# Patient Record
Sex: Female | Born: 1950
Health system: Southern US, Community
[De-identification: ages and names within clinical notes are randomized; demographics above are authoritative.]

## PROBLEM LIST (undated history)

## (undated) DIAGNOSIS — I10 Essential (primary) hypertension: Secondary | ICD-10-CM

## (undated) DIAGNOSIS — R0602 Shortness of breath: Secondary | ICD-10-CM

## (undated) DIAGNOSIS — Z87442 Personal history of urinary calculi: Secondary | ICD-10-CM

## (undated) DIAGNOSIS — L732 Hidradenitis suppurativa: Secondary | ICD-10-CM

## (undated) DIAGNOSIS — R05 Cough: Secondary | ICD-10-CM

## (undated) DIAGNOSIS — L408 Other psoriasis: Secondary | ICD-10-CM

## (undated) DIAGNOSIS — L219 Seborrheic dermatitis, unspecified: Secondary | ICD-10-CM

## (undated) DIAGNOSIS — R799 Abnormal finding of blood chemistry, unspecified: Secondary | ICD-10-CM

## (undated) DIAGNOSIS — Q638 Other specified congenital malformations of kidney: Secondary | ICD-10-CM

## (undated) DIAGNOSIS — L658 Other specified nonscarring hair loss: Secondary | ICD-10-CM

## (undated) DIAGNOSIS — N318 Other neuromuscular dysfunction of bladder: Secondary | ICD-10-CM

## (undated) DIAGNOSIS — D649 Anemia, unspecified: Secondary | ICD-10-CM

## (undated) DIAGNOSIS — H6060 Unspecified chronic otitis externa, unspecified ear: Secondary | ICD-10-CM

## (undated) DIAGNOSIS — M5137 Other intervertebral disc degeneration, lumbosacral region: Secondary | ICD-10-CM

## (undated) DIAGNOSIS — E663 Overweight: Secondary | ICD-10-CM

## (undated) DIAGNOSIS — D568 Other thalassemias: Secondary | ICD-10-CM

## (undated) DIAGNOSIS — L2089 Other atopic dermatitis: Secondary | ICD-10-CM

## (undated) DIAGNOSIS — R011 Cardiac murmur, unspecified: Secondary | ICD-10-CM

## (undated) DIAGNOSIS — E785 Hyperlipidemia, unspecified: Secondary | ICD-10-CM

## (undated) DIAGNOSIS — R002 Palpitations: Secondary | ICD-10-CM

## (undated) DIAGNOSIS — Z86010 Personal history of colonic polyps: Secondary | ICD-10-CM

## (undated) DIAGNOSIS — E739 Lactose intolerance, unspecified: Secondary | ICD-10-CM

## (undated) DIAGNOSIS — K219 Gastro-esophageal reflux disease without esophagitis: Secondary | ICD-10-CM

## (undated) DIAGNOSIS — L568 Other specified acute skin changes due to ultraviolet radiation: Secondary | ICD-10-CM

## (undated) DIAGNOSIS — J309 Allergic rhinitis, unspecified: Secondary | ICD-10-CM

## (undated) HISTORY — DX: Other atopic dermatitis: L20.89

## (undated) HISTORY — DX: Other intervertebral disc degeneration, lumbosacral region: M51.37

## (undated) HISTORY — DX: Lactose intolerance, unspecified: E73.9

## (undated) HISTORY — DX: Overweight: E66.3

## (undated) HISTORY — DX: Abnormal finding of blood chemistry, unspecified: R79.9

## (undated) HISTORY — DX: Palpitations: R00.2

## (undated) HISTORY — DX: Personal history of urinary calculi: Z87.442

## (undated) HISTORY — DX: Personal history of colonic polyps: Z86.010

## (undated) HISTORY — DX: Hyperlipidemia, unspecified: E78.5

## (undated) HISTORY — DX: Cardiac murmur, unspecified: R01.1

## (undated) HISTORY — DX: Anemia, unspecified: D64.9

## (undated) HISTORY — DX: Cough: R05

## (undated) HISTORY — PX: APPENDECTOMY: SHX54

## (undated) HISTORY — DX: Other neuromuscular dysfunction of bladder: N31.8

## (undated) HISTORY — DX: Gastro-esophageal reflux disease without esophagitis: K21.9

## (undated) HISTORY — DX: Other specified congenital malformations of kidney: Q63.8

## (undated) HISTORY — DX: Shortness of breath: R06.02

## (undated) HISTORY — DX: Hidradenitis suppurativa: L73.2

## (undated) HISTORY — DX: Allergic rhinitis, unspecified: J30.9

## (undated) HISTORY — DX: Essential (primary) hypertension: I10

## (undated) HISTORY — DX: Unspecified chronic otitis externa, unspecified ear: H60.60

## (undated) HISTORY — PX: OTHER SURGICAL HISTORY: SHX169

## (undated) HISTORY — DX: Other thalassemias: D56.8

## (undated) HISTORY — DX: Seborrheic dermatitis, unspecified: L21.9

## (undated) HISTORY — DX: Other psoriasis: L40.8

## (undated) HISTORY — DX: Other specified acute skin changes due to ultraviolet radiation: L56.8

## (undated) HISTORY — DX: Other specified nonscarring hair loss: L65.8

---
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
## 1998-08-25 ENCOUNTER — Ambulatory Visit (HOSPITAL_COMMUNITY): Admission: RE | Admit: 1998-08-25 | Discharge: 1998-08-25 | Payer: Self-pay | Admitting: Gastroenterology

## 1999-02-11 ENCOUNTER — Encounter: Payer: Self-pay | Admitting: Internal Medicine

## 1999-02-11 ENCOUNTER — Ambulatory Visit (HOSPITAL_COMMUNITY): Admission: RE | Admit: 1999-02-11 | Discharge: 1999-02-11 | Payer: Self-pay | Admitting: Internal Medicine

## 2000-06-27 ENCOUNTER — Other Ambulatory Visit: Admission: RE | Admit: 2000-06-27 | Discharge: 2000-06-27 | Payer: Self-pay | Admitting: *Deleted

## 2001-07-11 ENCOUNTER — Other Ambulatory Visit: Admission: RE | Admit: 2001-07-11 | Discharge: 2001-07-11 | Payer: Self-pay | Admitting: Obstetrics and Gynecology

## 2002-04-22 ENCOUNTER — Encounter: Payer: Self-pay | Admitting: *Deleted

## 2002-04-22 ENCOUNTER — Ambulatory Visit (HOSPITAL_COMMUNITY): Admission: RE | Admit: 2002-04-22 | Discharge: 2002-04-22 | Payer: Self-pay | Admitting: *Deleted

## 2002-05-09 ENCOUNTER — Encounter: Payer: Self-pay | Admitting: Urology

## 2002-05-09 ENCOUNTER — Ambulatory Visit (HOSPITAL_COMMUNITY): Admission: RE | Admit: 2002-05-09 | Discharge: 2002-05-09 | Payer: Self-pay | Admitting: Urology

## 2002-05-16 ENCOUNTER — Encounter: Payer: Self-pay | Admitting: Internal Medicine

## 2002-05-16 ENCOUNTER — Encounter: Admission: RE | Admit: 2002-05-16 | Discharge: 2002-05-16 | Payer: Self-pay | Admitting: Internal Medicine

## 2002-07-16 ENCOUNTER — Other Ambulatory Visit: Admission: RE | Admit: 2002-07-16 | Discharge: 2002-07-16 | Payer: Self-pay | Admitting: *Deleted

## 2003-07-31 ENCOUNTER — Other Ambulatory Visit: Admission: RE | Admit: 2003-07-31 | Discharge: 2003-07-31 | Payer: Self-pay | Admitting: *Deleted

## 2004-01-07 ENCOUNTER — Encounter: Payer: Self-pay | Admitting: Family Medicine

## 2004-01-07 LAB — CONVERTED CEMR LAB
Casts: NEGATIVE /lpf
Crystals: NEGATIVE
Leukocytes, UA: NEGATIVE
RBC / HPF: NONE SEEN
Urine Glucose: NEGATIVE mg/dL
WBC, UA: NONE SEEN cells/hpf

## 2004-08-26 ENCOUNTER — Other Ambulatory Visit: Admission: RE | Admit: 2004-08-26 | Discharge: 2004-08-26 | Payer: Self-pay | Admitting: Obstetrics and Gynecology

## 2005-11-28 DIAGNOSIS — I10 Essential (primary) hypertension: Secondary | ICD-10-CM | POA: Insufficient documentation

## 2005-11-28 HISTORY — DX: Essential (primary) hypertension: I10

## 2007-03-01 ENCOUNTER — Ambulatory Visit (HOSPITAL_COMMUNITY): Admission: RE | Admit: 2007-03-01 | Discharge: 2007-03-01 | Payer: Self-pay | Admitting: Family Medicine

## 2007-03-01 ENCOUNTER — Ambulatory Visit: Payer: Self-pay | Admitting: Family Medicine

## 2007-03-01 DIAGNOSIS — R0602 Shortness of breath: Secondary | ICD-10-CM

## 2007-03-01 HISTORY — DX: Shortness of breath: R06.02

## 2007-03-01 LAB — CONVERTED CEMR LAB
Albumin: 4.3 g/dL (ref 3.5–5.2)
CO2: 29 meq/L (ref 19–32)
Glucose, Bld: 87 mg/dL (ref 70–99)
MCV: 69 fL — ABNORMAL LOW (ref 78.0–100.0)
RBC: 5.54 M/uL — ABNORMAL HIGH (ref 3.87–5.11)
Sodium: 140 meq/L (ref 135–145)
TSH: 0.854 microintl units/mL (ref 0.350–5.50)
Total Bilirubin: 0.5 mg/dL (ref 0.3–1.2)
Total Protein: 7.9 g/dL (ref 6.0–8.3)
Vitamin B-12: 1497 pg/mL — ABNORMAL HIGH (ref 211–911)
WBC: 4.6 10*3/uL (ref 4.0–10.5)

## 2007-03-02 ENCOUNTER — Telehealth: Payer: Self-pay | Admitting: Family Medicine

## 2007-03-02 DIAGNOSIS — E663 Overweight: Secondary | ICD-10-CM | POA: Insufficient documentation

## 2007-03-02 DIAGNOSIS — D568 Other thalassemias: Secondary | ICD-10-CM | POA: Insufficient documentation

## 2007-03-02 DIAGNOSIS — E739 Lactose intolerance, unspecified: Secondary | ICD-10-CM | POA: Insufficient documentation

## 2007-03-02 DIAGNOSIS — L568 Other specified acute skin changes due to ultraviolet radiation: Secondary | ICD-10-CM

## 2007-03-02 DIAGNOSIS — Q638 Other specified congenital malformations of kidney: Secondary | ICD-10-CM

## 2007-03-02 DIAGNOSIS — H608X9 Other otitis externa, unspecified ear: Secondary | ICD-10-CM

## 2007-03-02 DIAGNOSIS — R002 Palpitations: Secondary | ICD-10-CM | POA: Insufficient documentation

## 2007-03-02 DIAGNOSIS — L209 Atopic dermatitis, unspecified: Secondary | ICD-10-CM

## 2007-03-02 DIAGNOSIS — H6060 Unspecified chronic otitis externa, unspecified ear: Secondary | ICD-10-CM

## 2007-03-02 DIAGNOSIS — Z87442 Personal history of urinary calculi: Secondary | ICD-10-CM

## 2007-03-02 DIAGNOSIS — J309 Allergic rhinitis, unspecified: Secondary | ICD-10-CM

## 2007-03-02 DIAGNOSIS — L2089 Other atopic dermatitis: Secondary | ICD-10-CM

## 2007-03-02 DIAGNOSIS — Z860100 Personal history of colon polyps, unspecified: Secondary | ICD-10-CM | POA: Insufficient documentation

## 2007-03-02 DIAGNOSIS — M51379 Other intervertebral disc degeneration, lumbosacral region without mention of lumbar back pain or lower extremity pain: Secondary | ICD-10-CM

## 2007-03-02 DIAGNOSIS — L658 Other specified nonscarring hair loss: Secondary | ICD-10-CM

## 2007-03-02 DIAGNOSIS — M5137 Other intervertebral disc degeneration, lumbosacral region: Secondary | ICD-10-CM

## 2007-03-02 DIAGNOSIS — K219 Gastro-esophageal reflux disease without esophagitis: Secondary | ICD-10-CM

## 2007-03-02 HISTORY — DX: Palpitations: R00.2

## 2007-03-02 HISTORY — DX: Other atopic dermatitis: L20.89

## 2007-03-02 HISTORY — DX: Personal history of colon polyps, unspecified: Z86.0100

## 2007-03-02 HISTORY — DX: Other intervertebral disc degeneration, lumbosacral region without mention of lumbar back pain or lower extremity pain: M51.379

## 2007-03-02 HISTORY — DX: Allergic rhinitis, unspecified: J30.9

## 2007-03-02 HISTORY — DX: Other otitis externa, unspecified ear: H60.8X9

## 2007-03-02 HISTORY — DX: Lactose intolerance, unspecified: E73.9

## 2007-03-02 HISTORY — DX: Other intervertebral disc degeneration, lumbosacral region: M51.37

## 2007-03-02 HISTORY — DX: Other thalassemias: D56.8

## 2007-03-02 HISTORY — DX: Other specified nonscarring hair loss: L65.8

## 2007-03-02 HISTORY — DX: Personal history of urinary calculi: Z87.442

## 2007-03-02 HISTORY — DX: Other specified congenital malformations of kidney: Q63.8

## 2007-03-02 HISTORY — DX: Other specified acute skin changes due to ultraviolet radiation: L56.8

## 2007-03-02 HISTORY — DX: Overweight: E66.3

## 2007-03-02 HISTORY — DX: Gastro-esophageal reflux disease without esophagitis: K21.9

## 2007-03-02 HISTORY — DX: Personal history of colonic polyps: Z86.010

## 2007-03-05 ENCOUNTER — Encounter: Payer: Self-pay | Admitting: Family Medicine

## 2007-03-06 ENCOUNTER — Encounter: Payer: Self-pay | Admitting: Family Medicine

## 2007-03-07 ENCOUNTER — Encounter: Payer: Self-pay | Admitting: Family Medicine

## 2007-03-07 ENCOUNTER — Ambulatory Visit: Payer: Self-pay | Admitting: Family Medicine

## 2007-03-08 ENCOUNTER — Encounter: Payer: Self-pay | Admitting: Family Medicine

## 2007-03-09 ENCOUNTER — Encounter: Payer: Self-pay | Admitting: *Deleted

## 2007-11-06 ENCOUNTER — Ambulatory Visit: Payer: Self-pay | Admitting: Gastroenterology

## 2007-11-12 LAB — HM MAMMOGRAPHY: HM Mammogram: NORMAL

## 2007-11-20 ENCOUNTER — Ambulatory Visit: Payer: Self-pay | Admitting: Gastroenterology

## 2007-12-03 ENCOUNTER — Ambulatory Visit: Payer: Self-pay | Admitting: Family Medicine

## 2007-12-03 LAB — CONVERTED CEMR LAB
BUN: 11 mg/dL (ref 6–23)
CO2: 23 meq/L (ref 19–32)
Calcium: 9 mg/dL (ref 8.4–10.5)
Glucose, Bld: 90 mg/dL (ref 70–99)
Sodium: 142 meq/L (ref 135–145)
Total CHOL/HDL Ratio: 3.8

## 2007-12-04 ENCOUNTER — Encounter: Payer: Self-pay | Admitting: Family Medicine

## 2007-12-04 DIAGNOSIS — L219 Seborrheic dermatitis, unspecified: Secondary | ICD-10-CM | POA: Insufficient documentation

## 2007-12-04 HISTORY — DX: Seborrheic dermatitis, unspecified: L21.9

## 2007-12-11 ENCOUNTER — Telehealth: Payer: Self-pay | Admitting: Gastroenterology

## 2008-05-15 ENCOUNTER — Ambulatory Visit: Payer: Self-pay | Admitting: Family Medicine

## 2008-08-28 DIAGNOSIS — L408 Other psoriasis: Secondary | ICD-10-CM

## 2008-08-28 HISTORY — DX: Other psoriasis: L40.8

## 2008-09-10 ENCOUNTER — Encounter: Payer: Self-pay | Admitting: Family Medicine

## 2008-09-10 LAB — CONVERTED CEMR LAB
Eosinophils Absolute: 0.1 10*3/uL
Hemoglobin: 11.7 g/dL
Lymphs Abs: 1.5 10*3/uL
MCV: 67.1 fL
Monocytes Absolute: 0.2 10*3/uL
Monocytes Relative: 6 %
Neutro Abs: 2.5 10*3/uL
Neutrophils Relative %: 58 %
RBC: 5.47 M/uL
WBC: 4.3 10*3/uL

## 2008-09-11 ENCOUNTER — Encounter: Payer: Self-pay | Admitting: Family Medicine

## 2008-09-11 LAB — CONVERTED CEMR LAB
ALT: 12 units/L
AST: 16 units/L
Albumin: 4 g/dL
Bilirubin, Direct: 0.1 mg/dL
GGT: 7.3 units/L
Total Protein: 7.3 g/dL

## 2008-09-30 ENCOUNTER — Encounter: Payer: Self-pay | Admitting: Family Medicine

## 2008-09-30 LAB — CONVERTED CEMR LAB
ALT: 10 units/L
Albumin: 3.9 g/dL
Basophils Relative: 0 %
Eosinophils Absolute: 0.1 10*3/uL
Hemoglobin: 11.1 g/dL
Iron: 46 ug/dL
MCHC: 31.6 g/dL
MCV: 67.5 fL
Monocytes Absolute: 0.3 10*3/uL
Monocytes Relative: 6 %
RBC: 5.2 M/uL
RDW: 16.2 %
Total Protein: 7.4 g/dL
UIBC: 235 ug/dL

## 2008-10-01 ENCOUNTER — Encounter: Payer: Self-pay | Admitting: Family Medicine

## 2008-10-01 ENCOUNTER — Telehealth: Payer: Self-pay | Admitting: Family Medicine

## 2008-10-09 ENCOUNTER — Encounter: Admission: RE | Admit: 2008-10-09 | Discharge: 2008-10-09 | Payer: Self-pay | Admitting: Family Medicine

## 2008-10-09 ENCOUNTER — Ambulatory Visit: Payer: Self-pay | Admitting: Family Medicine

## 2008-10-09 DIAGNOSIS — R059 Cough, unspecified: Secondary | ICD-10-CM

## 2008-10-09 DIAGNOSIS — D649 Anemia, unspecified: Secondary | ICD-10-CM

## 2008-10-09 DIAGNOSIS — R05 Cough: Secondary | ICD-10-CM

## 2008-10-09 HISTORY — DX: Cough, unspecified: R05.9

## 2008-10-09 HISTORY — DX: Anemia, unspecified: D64.9

## 2008-10-09 LAB — CONVERTED CEMR LAB: Saturation Ratios: 37 % (ref 20–55)

## 2008-10-10 ENCOUNTER — Encounter: Payer: Self-pay | Admitting: Family Medicine

## 2008-10-10 ENCOUNTER — Telehealth: Payer: Self-pay | Admitting: Family Medicine

## 2008-11-25 ENCOUNTER — Encounter: Admission: RE | Admit: 2008-11-25 | Discharge: 2008-11-25 | Payer: Self-pay | Admitting: Obstetrics and Gynecology

## 2009-09-03 ENCOUNTER — Ambulatory Visit: Payer: Self-pay | Admitting: Internal Medicine

## 2009-09-03 DIAGNOSIS — N318 Other neuromuscular dysfunction of bladder: Secondary | ICD-10-CM

## 2009-09-03 DIAGNOSIS — R768 Other specified abnormal immunological findings in serum: Secondary | ICD-10-CM | POA: Insufficient documentation

## 2009-09-03 DIAGNOSIS — R799 Abnormal finding of blood chemistry, unspecified: Secondary | ICD-10-CM

## 2009-09-03 HISTORY — DX: Other neuromuscular dysfunction of bladder: N31.8

## 2009-09-03 HISTORY — DX: Abnormal finding of blood chemistry, unspecified: R79.9

## 2009-09-08 ENCOUNTER — Ambulatory Visit: Payer: Self-pay | Admitting: Internal Medicine

## 2009-09-08 LAB — CONVERTED CEMR LAB
Alkaline Phosphatase: 67 units/L (ref 39–117)
BUN: 6 mg/dL (ref 6–23)
Basophils Absolute: 0 10*3/uL (ref 0.0–0.1)
Basophils Relative: 0.5 % (ref 0.0–3.0)
Bilirubin, Direct: 0.1 mg/dL (ref 0.0–0.3)
Chloride: 102 meq/L (ref 96–112)
Cholesterol: 189 mg/dL (ref 0–200)
Eosinophils Absolute: 0.1 10*3/uL (ref 0.0–0.7)
LDL Cholesterol: 124 mg/dL — ABNORMAL HIGH (ref 0–99)
Lymphocytes Relative: 45.5 % (ref 12.0–46.0)
MCHC: 32 g/dL (ref 30.0–36.0)
Neutrophils Relative %: 42.6 % — ABNORMAL LOW (ref 43.0–77.0)
Potassium: 3.8 meq/L (ref 3.5–5.1)
RBC: 4.95 M/uL (ref 3.87–5.11)
RDW: 16.6 % — ABNORMAL HIGH (ref 11.5–14.6)
Specific Gravity, Urine: 1.01 (ref 1.000–1.030)
Total Bilirubin: 0.8 mg/dL (ref 0.3–1.2)
Urine Glucose: NEGATIVE mg/dL
VLDL: 16.8 mg/dL (ref 0.0–40.0)
pH: 7.5 (ref 5.0–8.0)

## 2009-10-22 ENCOUNTER — Telehealth: Payer: Self-pay | Admitting: Internal Medicine

## 2009-11-23 ENCOUNTER — Telehealth: Payer: Self-pay | Admitting: Internal Medicine

## 2009-12-30 ENCOUNTER — Encounter: Payer: Self-pay | Admitting: Internal Medicine

## 2010-01-05 ENCOUNTER — Encounter: Payer: Self-pay | Admitting: Internal Medicine

## 2010-03-05 ENCOUNTER — Ambulatory Visit: Payer: Self-pay | Admitting: Internal Medicine

## 2010-03-05 DIAGNOSIS — L732 Hidradenitis suppurativa: Secondary | ICD-10-CM | POA: Insufficient documentation

## 2010-03-05 HISTORY — DX: Hidradenitis suppurativa: L73.2

## 2010-06-20 ENCOUNTER — Encounter: Payer: Self-pay | Admitting: Orthopedic Surgery

## 2010-06-27 LAB — CONVERTED CEMR LAB
Anti Nuclear Antibody(ANA): POSITIVE — AB
LDL Cholesterol: 106 mg/dL — ABNORMAL HIGH (ref 0–99)
Total Protein, Serum Electrophoresis: 7.8 g/dL (ref 6.0–8.3)

## 2010-06-29 NOTE — Assessment & Plan Note (Signed)
Summary: 6 MO ROV /NWS #   Vital Signs:  Patient profile:   60 year old female Height:      61 inches Weight:      157.38 pounds BMI:     29.84 O2 Sat:      98 % on Room air Temp:     98.1 degrees F oral Pulse rate:   56 / minute BP sitting:   120 / 60  (left arm) Cuff size:   regular  Vitals Entered By: Alysia Penna (March 05, 2010 2:24 PM)  O2 Flow:  Room air CC: pt here for 6 mth follow up. /cp sma   CC:  pt here for 6 mth follow up. /cp sma.  History of Present Illness: here to f/u with routine, but incidently  with 2-3 days fever, mild ST, prod cough but Pt denies CP, worsening sob, doe, wheezing, orthopnea, pnd, worsening LE edema, palps, dizziness or syncope  Pt denies new neuro symptoms such as headache, facial or extremity weakness  Denies polydipsia or polyuria.  Also with a tender area for 1 wk to right axilla, wihtout drainage or d/c or hx of similar in the past.  No trauma, injury,  No recent wt loss, night sweats, loss of appetite or other constitutional symptoms .  Overall good compliance with meds,  good tolerabity.    Problems Prior to Update: 1)  Hidradenitis  (ICD-705.83) 2)  Bronchitis-acute  (ICD-466.0) 3)  Preventive Health Care  (ICD-V70.0) 4)  Overactive Bladder  (ICD-596.51) 5)  Anemia-nos  (ICD-285.9) 6)  Ana Positive  (ICD-790.99) 7)  Psoriasis  (ICD-696.1) 8)  Cough  (ICD-786.2) 9)  Anemia  (ICD-285.9) 10)  Dermatitis, Seborrheic  (ICD-690.10) 11)  Thalassemia Nec  (ICD-282.49) 12)  Hypertension, Benign  (ICD-401.1) 13)  Palpitations, Recurrent  (ICD-785.1) 14)  Otitis Externa, Chronic Nec  (ICD-380.23) 15)  Photoallergic Dermatitis  (ICD-692.72) 16)  Degeneration, Lumbar/lumbosacral Disc  (ICD-722.52) 17)  Nephrolithiasis, Hx of  (ICD-V13.01) 18)  Gerd  (ICD-530.81) 19)  Colonic Polyps, Hx of  (ICD-V12.72) 20)  Allergic Rhinitis  (ICD-477.9) 21)  Overweight  (ICD-278.02) 22)  Lactose Intolerance  (ICD-271.3) 23)  Sx of Urinary  Incontinence, Urge, Mild  (ICD-788.31) 24)  Sudden Death, Family Hx  (ICD-V19.8) 25)  Neoplasm, Malignant, Breast, Family Hx, Mother  (ICD-V16.3) 51)  Anomaly, Congenital, Kidney Nec  (ICD-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 Per Dermatologist 2)  Proair Hfa 108 [phone] Base) Mcg/act Aers (Albuterol Sulfate) .... 2 Inhalations Four Times A Day For Two Weeks 3)  Methotrexate 2.5 Mg Tabs (Methotrexate Sodium) .... Take 9 Tablets By Mouth Once A Week Every Wednesday 4)  Hydrochlorothiazide 12.5 Mg Caps (Hydrochlorothiazide) .Marland Kitchen.. 1 Tablet Daily By Mouth 5)  Folic Acid 1 Mg Tabs (Folic Acid) .Marland Kitchen.. 1 By Mouth Once Daily 6)  Vesicare 5 Mg Tabs (Solifenacin Succinate) .Marland Kitchen.. 1po Once Daily 7)  Hydrochlorothiazide 12.5 Mg Tabs (Hydrochlorothiazide) .Marland Kitchen.. 1 By Mouth Once Daily  Current Medications (verified): 1)  Clobetasol Propionate 0.05 %  Crea (Clobetasol Propionate) .... Per Dermatologist 2)  Proair Hfa 108 [phone] Base) Mcg/act Aers (Albuterol Sulfate) .... 2 Inhalations Four Times A Day For Two Weeks 3)  Methotrexate 2.5 Mg Tabs (Methotrexate Sodium) .Marland KitchenMarland KitchenMarland Kitchen  Take 9 Tablets By Mouth Once A Week Every Wednesday 4)  Folic Acid 1 Mg Tabs (Folic Acid) .Marland Kitchen.. 1 By Mouth Once Daily 5)  Vesicare 5 Mg Tabs (Solifenacin Succinate) .Marland Kitchen.. 1po Once Daily 6)  Hydrochlorothiazide 12.5 Mg Tabs (Hydrochlorothiazide) .Marland Kitchen.. 1 By Mouth Once Daily 7)  Cephalexin 500 Mg Caps (Cephalexin) .Marland Kitchen.. 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 Renal calculi (one) in pelvis of Kidney by 2003 Abdominal CT. Seen by Dr. Brunilda Payor who felt they were too small to be of consequence. Palpitations (NOS) Hypertension, benign (dx 2007) "Small esophagus" with intermittent dysphagia to solids (EGD years ago by [facility] in 2002 by Dr. Loreta Ave - some polyps found.  usually has repeat colonoscopy every 5 years. Consitpation- usual pattern on "hard" stool BM every 3 to 4 days GERD-Followed by Dr. Loreta Ave with EGD in approximately year 2000 Lactose intolerance- increase flatus. Colonic polyps, hx of Nephrolithiasis, hx of Degenerative Disc Disease L3-4 and L4-5 on Lumbar spine MRI 04/2002. Complained of Back paine to [facility] MD in 12/03, described as discomfort in right lumbar with radiation to foot with some numbness and weakness and pain 5-7/10 scale. Likely thalassemia minor with Hemoglobin Electrophoresis (12/03) with Normal Hgb A, Hgb A2, Hgb F levels and no Hgb S, C, E, or other detected.  ANA positive GERD Allergic rhinitis Anemia-NOS  Past Surgical History: Last updated: 03/07/2007 Exploratory abdominal Surgery in 1970's. Appendectomy S/P BTL S/P C-section twice  Social History: Last updated: 10/09/2008 Two adult sons, born  ~1988 and 58, both living in area, One son is is married to [facility] CNA [employer]. 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  , mild ill  Head:  normocephalic and atraumatic.   Eyes:  vision grossly intact, pupils equal, and pupils round.   Ears:  bilat tm's with mild red, sinus nontender Nose:  nasal dischargemucosal pallor and mucosal edema.   Mouth:  pharyngeal erythema and fair dentition.   Neck:  supple and no masses.   Lungs:  normal respiratory effort and normal breath sounds.   Heart:  normal rate and regular rhythm.   Extremities:  no edema, no erythema  Axillary Nodes:  several tender, swollen right axialla only but < 1 cm   Impression & Recommendations:  Problem # 1:  BRONCHITIS-ACUTE (ICD-466.0)  Her updated medication list for this problem includes:    Proair Hfa 108 (90 Base) Mcg/act Aers (Albuterol sulfate) .Marland Kitchen... 2 inhalations four times a day for two weeks    Cephalexin 500 Mg Caps (Cephalexin) .Marland Kitchen... 1po three times a day treat as above, f/u any worsening signs or symptoms  Problem # 2:  HIDRADENITIS (ICD-705.83) mild, right axilla - treat as above, f/u any worsening signs or symptoms   Problem # 3:  HYPERTENSION, BENIGN (ICD-401.1)  The following medications were removed from the medication list:    Hydrochlorothiazide 12.5 Mg Caps (Hydrochlorothiazide) .Marland Kitchen... 1 tablet daily by mouth Her updated medication list for this problem includes:    Hydrochlorothiazide 12.5 Mg Tabs (Hydrochlorothiazide) .Marland Kitchen... 1 by mouth once daily  BP today: 120/60 Prior BP: 118/68 (09/03/2009)  Prior 10 Yr Risk Heart Disease: Not enough information (03/01/2007)  Labs Reviewed: K+: 3.8 (09/08/2009) Creat: : 0.7 (09/08/2009)   Chol: 189 (09/08/2009)   HDL: 47.80 (09/08/2009)   LDL: 124 (09/08/2009)   TG: 84.0 (09/08/2009) stable overall by hx and exam, ok to continue meds/tx as is   Complete Medication List: 1)  Clobetasol Propionate 0.05 % Crea (Clobetasol propionate) ....  Per dermatologist 2)  Proair Hfa 108 [phone] Base) Mcg/act Aers (Albuterol sulfate) .... 2 inhalations four times a day for two weeks 3)  Methotrexate 2.5 Mg Tabs (Methotrexate sodium) .... Take 9 tablets by mouth once a week every wednesday 4)  Folic Acid 1 Mg Tabs (Folic acid) .Marland Kitchen.. 1 by mouth once daily 5)  Vesicare 5 Mg Tabs (Solifenacin succinate) .Marland Kitchen.. 1po once daily 6)  Hydrochlorothiazide 12.5 Mg Tabs (Hydrochlorothiazide) .Marland Kitchen.. 1 by mouth once daily 7)  Cephalexin 500 Mg Caps (Cephalexin) .Marland Kitchen.. 1po three times a day  Patient Instructions: 1)  Please take all new medications as prescribed 2)  Continue all previous medications as before this visit  3)  Please schedule a follow-up appointment in 6 months with CPX labs Prescriptions: CEPHALEXIN 500 MG CAPS (CEPHALEXIN) 1po three times a day  #30 x 0   Entered and Authorized by:   Corwin Levins MD   Signed by:   Corwin Levins MD on 03/05/2010   Method used:   Print then Give to Patient   RxID:   [id number]

## 2010-06-29 NOTE — Progress Notes (Signed)
  Phone Note From Pharmacy   Caller: [facility] [address] Summary of Call: Pharmacy requesting pt. would like to switch to Hydrochlorot 12.5mg  tablets Initial call taken by: Robin Ewing CMA Duncan Dull),  November 23, 2009 2:58 PM  Follow-up for Phone Call        ok with me - to robin to handle Follow-up by: Corwin Levins MD,  November 23, 2009 3:53 PM    New/Updated Medications: HYDROCHLOROTHIAZIDE 12.5 MG TABS (HYDROCHLOROTHIAZIDE) 1 by mouth once daily Prescriptions: HYDROCHLOROTHIAZIDE 12.5 MG TABS (HYDROCHLOROTHIAZIDE) 1 by mouth once daily  #30 x 9   Entered by:   Zella Ball Ewing CMA (AAMA)   Authorized by:   Corwin Levins MD   Signed by:   Scharlene Gloss CMA (AAMA) on 11/23/2009   Method used:   Electronically to        [facility] [address] (retail)       [phone]       [place], [place]  [address]       Ph: [phone]       Fax: [phone]   RxID:   [id number]

## 2010-06-29 NOTE — Assessment & Plan Note (Signed)
Summary: NEW / BCBS / #/ CD   Vital Signs:  Patient profile:   60 year old female Height:      61 inches Weight:      157.13 pounds BMI:     29.80 O2 Sat:      97 % on Room air Temp:     98.6 degrees F oral Pulse rate:   58 / minute BP sitting:   118 / 68  (left arm) Cuff size:   regular  Vitals Entered ByZella Ball Ewing (September 03, 2009 9:44 AM)  O2 Flow:  Room air  CC: New Pt. New BCBS/RE   CC:  New Pt. New BCBS/RE.  History of Present Illness: overall doing ok;  no specific complaiints;  has chronic rash doesnot hurt or itch;  Pt denies CP, sob, doe, wheezing, orthopnea, pnd, worsening LE edema, palps, dizziness or syncope  Pt denies new neuro symptoms such as headache, facial or extremity weakness  Has some fatigue assoc with some sleep difficulty and gets up 3 or 4 times during the night with urination most times.  Tends to have OAB type symtpoms during the day as well.    Problems Prior to Update: 1)  Overactive Bladder  (ICD-596.51) 2)  Anemia-nos  (ICD-285.9) 3)  Ana Positive  (ICD-790.99) 4)  Psoriasis  (ICD-696.1) 5)  Cough  (ICD-786.2) 6)  Anemia  (ICD-285.9) 7)  Dermatitis, Seborrheic  (ICD-690.10) 8)  Thalassemia Nec  (ICD-282.49) 9)  Hypertension, Benign  (ICD-401.1) 10)  Palpitations, Recurrent  (ICD-785.1) 11)  Otitis Externa, Chronic Nec  (ICD-380.23) 12)  Photoallergic Dermatitis  (ICD-692.72) 13)  Degeneration, Lumbar/lumbosacral Disc  (ICD-722.52) 14)  Nephrolithiasis, Hx of  (ICD-V13.01) 15)  Gerd  (ICD-530.81) 16)  Colonic Polyps, Hx of  (ICD-V12.72) 17)  Allergic Rhinitis  (ICD-477.9) 18)  Overweight  (ICD-278.02) 19)  Lactose Intolerance  (ICD-271.3) 20)  Sx of Urinary Incontinence, Urge, Mild  (ICD-788.31) 21)  Sudden Death, Family Hx  (ICD-V19.8) 22)  Neoplasm, Malignant, Breast, Family Hx, Mother  (ICD-V16.3) 52)  Anomaly, Congenital, Kidney Nec  (ICD-753.3) 24)  Alopecia Totalis  (ICD-704.09) 25)  Hx of Dermatitis, Atopic  (ICD-691.8) 26)   Screening For Lipoid Disorders  (ICD-V77.91) 27)  Dyspnea  (ICD-786.05) 28)  Aftercare, Long-term Use, Medications Nec  (ICD-V58.69)  Medications Prior to Update: 1)  Clobetasol Propionate 0.05 %  Crea (Clobetasol Propionate) .... Per Dermatologist 2)  Proair Hfa 108 [phone] Base) Mcg/act Aers (Albuterol Sulfate) .... 2 Inhalations Four Times A Day For Two Weeks 3)  Methotrexate 2.5 Mg Tabs (Methotrexate Sodium) .... Take 9 Tablets By Mouth Once A Week Every Wednesday 4)  Hydrochlorothiazide 12.5 Mg Caps (Hydrochlorothiazide) .Marland Kitchen.. 1 Tablet Daily By Mouth  Current Medications (verified): 1)  Clobetasol Propionate 0.05 %  Crea (Clobetasol Propionate) .... Per Dermatologist 2)  Proair Hfa 108 [phone] Base) Mcg/act Aers (Albuterol Sulfate) .... 2 Inhalations Four Times A Day For Two Weeks 3)  Methotrexate 2.5 Mg Tabs (Methotrexate Sodium) .... Take 9 Tablets By Mouth Once A Week Every Wednesday 4)  Hydrochlorothiazide 12.5 Mg Caps (Hydrochlorothiazide) .Marland Kitchen.. 1 Tablet Daily By Mouth 5)  Folic Acid 1 Mg Tabs (Folic Acid) .Marland Kitchen.. 1 By Mouth Once Daily 6)  Vesicare 5 Mg Tabs (Solifenacin Succinate) .Marland Kitchen.. 1po Once Daily  Allergies (verified): No Known Drug Allergies  Past History:  Family History: Last updated: March 05, 2007 Sister died age 61 of sudden death Sister had heart valve replaced at age 30  Father with heart atack at  age 34, died age 27 Mother with Breast Cancer diagnosed at age 34, died age 29  Social History: Last updated: 10/09/2008 Two adult sons, born  ~1988 and 97, both living in area, One son is is married to [facility] CNA [employer]. 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 Renal calculi (one) in pelvis of Kidney by 2003 Abdominal CT. Seen by Dr. Brunilda Payor who felt they were too small to be of consequence. Palpitations (NOS) Hypertension, benign (dx 2007) "Small esophagus" with intermittent dysphagia to solids (EGD years ago by [facility] in 2002 by Dr. Loreta Ave - some polyps found.  usually has repeat colonoscopy every 5 years. Consitpation- usual pattern on "hard" stool BM every 3 to 4 days GERD-Followed by Dr. Loreta Ave with EGD in approximately year 2000 Lactose intolerance- increase flatus. Colonic polyps, hx of Nephrolithiasis, hx of Degenerative Disc Disease L3-4 and L4-5 on Lumbar spine MRI 04/2002. Complained of Back paine to [facility] MD in 12/03, described as discomfort in right lumbar with radiation to foot with some numbness and weakness and pain 5-7/10 scale. Likely thalassemia minor with Hemoglobin Electrophoresis (12/03) with Normal Hgb A, Hgb A2, Hgb F levels and no Hgb S, C, E, or other detected.  ANA positive GERD Allergic rhinitis Anemia-NOS  Past Surgical History: Reviewed history from 03/07/2007 and no changes required. Exploratory abdominal Surgery in 1970's. Appendectomy S/P BTL S/P C-section twice  Family History: Reviewed history from 03/01/2007 and no changes required. Sister died age 66 of sudden death Sister had heart valve replaced at age 53  Father with heart atack at age 76, died age 43 Mother with Breast Cancer diagnosed at age 25, died age 80  Social History: Reviewed history from 10/09/2008 and no changes  required. Two adult sons, born  ~91 and 97, both living in area, One son is is married to [facility] CNA [employer]. 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         all otherwise negative per pt -    Physical Exam  General:  alert and well-developed.   Head:  normocephalic and atraumatic.   Eyes:  vision grossly intact, pupils equal, and pupils round.   Ears:  R ear normal and L ear normal.   Nose:  no external deformity and no nasal discharge.   Mouth:  no gingival abnormalities and pharynx pink and moist.   Neck:  supple and no masses.   Lungs:  normal respiratory effort and normal breath sounds.   Heart:  normal rate and regular rhythm.   Abdomen:  soft, non-tender, and normal bowel sounds.   Msk:  no joint tenderness and no joint swelling.   Extremities:  no edema, no erythema  Neurologic:  cranial nerves II-XII intact and strength normal in all extremities.   Skin:  color normal and no rashes.   Psych:  moderately anxious.     Impression & Recommendations:  Problem # 1:  Preventive Health Care (ICD-V70.0) Overall doing well, age appropriate education and counseling updated and referral for appropriate preventive services done unless declined, immunizations up to date or declined, diet counseling done if overweight, urged to quit smoking if smokes , most recent labs reviewed and  current ordered if appropriate, ecg reviewed or declined (interpretation per ECG scanned in the EMR if done); information regarding Medicare Prevention requirements given if appropriate   Problem # 2:  OVERACTIVE BLADDER (ICD-596.51) to try vesicare 5 mg   Problem # 3:  HYPERTENSION, BENIGN (ICD-401.1)  Her updated medication list for this problem includes:    Hydrochlorothiazide 12.5 Mg Caps (Hydrochlorothiazide) .Marland Kitchen... 1 tablet daily by mouth  BP today: 118/68 Prior BP: 147/75 (10/09/2008)  Prior 10 Yr Risk Heart Disease: Not enough information (03/01/2007)  Labs Reviewed: K+: 4.4 (12/03/2007) Creat: : 0.60 (12/03/2007)   Chol: 183 (12/03/2007)   HDL: 48 (12/03/2007)   LDL: 106 (12/03/2007)   TG: 147 (12/03/2007) stable overall by hx and exam, ok to continue meds/tx as is   Complete Medication List: 1)  Clobetasol Propionate 0.05 % Crea (Clobetasol propionate) .... Per dermatologist 2)  Proair Hfa 108 [phone] Base) Mcg/act Aers (Albuterol sulfate) .... 2 inhalations four times a day for two weeks 3)  Methotrexate 2.5 Mg Tabs (Methotrexate sodium) .... Take 9 tablets by mouth once a week every wednesday 4)  Hydrochlorothiazide 12.5 Mg Caps (Hydrochlorothiazide) .Marland Kitchen.. 1 tablet daily by mouth 5)  Folic Acid 1 Mg Tabs (Folic acid) .Marland Kitchen.. 1 by mouth once daily 6)  Vesicare 5 Mg Tabs (Solifenacin succinate) .Marland Kitchen.. 1po once daily  Patient Instructions: 1)  please return for LAB only on Mon or Tue Apr 11 or 12 for CPX labs 2)  Please call the number on the [facility] for results of your blood tests later 3)  we will send the results to Dr Londell Moh when they are available 4)  start the vesicare at 5 mg per day 5)  Please schedule a follow-up appointment in 6 months, or sooner if needed Prescriptions: VESICARE 5 MG TABS (SOLIFENACIN SUCCINATE) 1po once daily  #30 x 11   Entered and Authorized by:   Corwin Levins MD   Signed by:   Corwin Levins MD on 09/03/2009   Method used:   Print then Give to  Patient   RxID:   [id number]    Immunization History:  Tetanus/Td Immunization History:    Tetanus/Td:  historical (08/29/2006)

## 2010-06-29 NOTE — Progress Notes (Signed)
Summary: Rx refill req  Phone Note Refill Request   Refills Requested: Medication #1:  HYDROCHLOROTHIAZIDE 12.5 MG CAPS 1 tablet daily by mouth Initial call taken by: Margaret Pyle, CMA,  Oct 22, 2009 11:03 AM    Prescriptions: HYDROCHLOROTHIAZIDE 12.5 MG CAPS (HYDROCHLOROTHIAZIDE) 1 tablet daily by mouth  #30 x 9   Entered by:   Margaret Pyle, CMA   Authorized by:   Corwin Levins MD   Signed by:   Margaret Pyle, CMA on 10/22/2009   Method used:   Electronically to        [facility] [address] (retail)       [phone]       [place], [place]  [address]       Ph: [phone]       Fax: [phone]   RxID:   [id number]

## 2010-09-02 ENCOUNTER — Encounter: Payer: Self-pay | Admitting: Internal Medicine

## 2010-09-03 ENCOUNTER — Other Ambulatory Visit: Payer: Self-pay

## 2010-09-10 ENCOUNTER — Encounter: Payer: Self-pay | Admitting: Internal Medicine

## 2010-09-10 ENCOUNTER — Ambulatory Visit: Payer: Self-pay | Admitting: Internal Medicine

## 2010-09-10 DIAGNOSIS — Z0001 Encounter for general adult medical examination with abnormal findings: Secondary | ICD-10-CM | POA: Insufficient documentation

## 2010-09-10 DIAGNOSIS — Z0000 Encounter for general adult medical examination without abnormal findings: Secondary | ICD-10-CM

## 2010-09-13 ENCOUNTER — Other Ambulatory Visit (INDEPENDENT_AMBULATORY_CARE_PROVIDER_SITE_OTHER): Payer: BC Managed Care – PPO

## 2010-09-13 ENCOUNTER — Encounter: Payer: Self-pay | Admitting: Internal Medicine

## 2010-09-13 ENCOUNTER — Other Ambulatory Visit (INDEPENDENT_AMBULATORY_CARE_PROVIDER_SITE_OTHER): Payer: BC Managed Care – PPO | Admitting: Internal Medicine

## 2010-09-13 ENCOUNTER — Ambulatory Visit (INDEPENDENT_AMBULATORY_CARE_PROVIDER_SITE_OTHER): Payer: BC Managed Care – PPO | Admitting: Internal Medicine

## 2010-09-13 ENCOUNTER — Other Ambulatory Visit: Payer: Self-pay | Admitting: Internal Medicine

## 2010-09-13 VITALS — BP 108/72 | HR 71 | Temp 98.1°F | Resp 14 | Ht 61.0 in | Wt 160.5 lb

## 2010-09-13 DIAGNOSIS — D509 Iron deficiency anemia, unspecified: Secondary | ICD-10-CM

## 2010-09-13 DIAGNOSIS — Z0000 Encounter for general adult medical examination without abnormal findings: Secondary | ICD-10-CM

## 2010-09-13 LAB — CBC WITH DIFFERENTIAL/PLATELET
Basophils Absolute: 0 10*3/uL (ref 0.0–0.1)
Eosinophils Relative: 1.3 % (ref 0.0–5.0)
MCV: 71.3 fl — ABNORMAL LOW (ref 78.0–100.0)
Monocytes Absolute: 0.3 10*3/uL (ref 0.1–1.0)
Neutrophils Relative %: 59.1 % (ref 43.0–77.0)
Platelets: 170 10*3/uL (ref 150.0–400.0)
RDW: 16.5 % — ABNORMAL HIGH (ref 11.5–14.6)
WBC: 5 10*3/uL (ref 4.5–10.5)

## 2010-09-13 LAB — LIPID PANEL
Cholesterol: 195 mg/dL (ref 0–200)
HDL: 56.8 mg/dL (ref >39.00)
LDL Cholesterol: 127 mg/dL — ABNORMAL HIGH (ref 0–99)
Total CHOL/HDL Ratio: 3
Triglycerides: 56 mg/dL (ref 0.0–149.0)
VLDL: 11.2 mg/dL (ref 0.0–40.0)

## 2010-09-13 LAB — HEPATIC FUNCTION PANEL
ALT: 14 U/L (ref 0–35)
Bilirubin, Direct: 0.1 mg/dL (ref 0.0–0.3)
Total Bilirubin: 0.9 mg/dL (ref 0.3–1.2)

## 2010-09-13 LAB — BASIC METABOLIC PANEL
BUN: 15 mg/dL (ref 6–23)
Creatinine, Ser: 0.6 mg/dL (ref 0.4–1.2)
GFR: 128.56 mL/min (ref >60.00)
Glucose, Bld: 100 mg/dL — ABNORMAL HIGH (ref 70–99)

## 2010-09-13 LAB — URINALYSIS, ROUTINE W REFLEX MICROSCOPIC
Bilirubin Urine: NEGATIVE
Ketones, ur: NEGATIVE
Specific Gravity, Urine: 1.015 (ref 1.000–1.030)
Urine Glucose: NEGATIVE
Urobilinogen, UA: 1 (ref 0.0–1.0)

## 2010-09-13 NOTE — Patient Instructions (Signed)
Continue all other medications as before You will be contacted regarding the referral for: GI for the iron deficiency anemia Please call the number on the Blue Card (the [facility] System) for results of testing in 2-3 days Please return in 1 year for your yearly visit, or sooner if needed, with Lab testing done 3-5 days before

## 2010-09-13 NOTE — Assessment & Plan Note (Signed)
Overall doing well, age appropriate education and counseling updated, referrals for preventative services and immunizations addressed, dietary and smoking counseling addressed, most recent labs and ECG reviewed.  I have personally reviewed and have noted: 1) the patient's medical and social history 2) The pt's use of alcohol, tobacco, and illicit drugs 3) The patient's current medications and supplements 4) Functional ability including ADL's, fall risk, home safety risk, hearing and visual impairment 5) Diet and physical activities 6) Evidence for depression or mood disorder 7) The patient's height, weight, and BMI have been recorded in the chart I have made referrals, and provided counseling and education based on review of the above Pt has labs done this am - to call the [facility] system for results - blue card information given

## 2010-09-13 NOTE — Assessment & Plan Note (Signed)
New onset - for GI referral for further evaluation

## 2010-09-13 NOTE — Progress Notes (Signed)
Subjective:    Patient ID: Victoria Hale, female    DOB: [birth year], 60 y.o.   MRN: [id number]  HPI Here for wellness and f/u;  Overall doing ok;  Pt denies CP, worsening SOB, DOE, wheezing, orthopnea, PND, worsening LE edema, palpitations, dizziness or syncope.  Pt denies neurological change such as new Headache, facial or extremity weakness.  Pt denies polydipsia, polyuria, or low sugar symptoms. Pt states overall good compliance with treatment and medications, good tolerability, and trying to follow lower cholesterol diet.  Pt denies worsening depressive symptoms, suicidal ideation or panic. No fever, wt loss, night sweats, loss of appetite, or other constitutional symptoms.  Pt states good ability with ADL's, low fall risk, home safety reviewed and adequate, no significant changes in hearing or vision, and occasionally active with exercise.  Still has some urinary urgency and freq, vesicare too expensive, wants to hold off on meds for now.  No overt bleeding or brusing. Last saw Dr Arlyce Dice for colonscopy 2009,  Iron studies normal may 2010, now abnormal per recent GYn labs.  No prior EGD, denies current GI complaints Past Medical History  Diagnosis Date  . LACTOSE INTOLERANCE 03/02/2007  . Overweight 03/02/2007  . THALASSEMIA NEC 03/02/2007  . OTITIS EXTERNA, CHRONIC NEC 03/02/2007  . HYPERTENSION, BENIGN 11/28/2005  . ALLERGIC RHINITIS 03/02/2007  . GERD 03/02/2007  . OVERACTIVE BLADDER 09/03/2009  . DERMATITIS, SEBORRHEIC 12/04/2007  . DERMATITIS, ATOPIC 03/02/2007  . PHOTOALLERGIC DERMATITIS 03/02/2007  . PSORIASIS 08/28/2008  . ALOPECIA TOTALIS 03/02/2007  . Hidradenitis 03/05/2010  . DEGENERATION, LUMBAR/LUMBOSACRAL DISC 03/02/2007  . ANOMALY, CONGENITAL, KIDNEY NEC 03/02/2007  . PALPITATIONS, RECURRENT 03/02/2007  . DYSPNEA 03/01/2007  . Cough 10/09/2008  . ANA POSITIVE 09/03/2009  . COLONIC POLYPS, HX OF 03/02/2007  . NEPHROLITHIASIS, HX OF 03/02/2007  . Unspecified anemia 10/09/2008   Past  Surgical History  Procedure Date  . Exploratory abdominal surgury 1970's  . Appendectomy   . S/p btl   . S/p c-section twice     reports that she quit smoking about 24 years ago. She does not have any smokeless tobacco history on file. She reports that she drinks alcohol. She reports that she does not use illicit drugs. family history includes Breast cancer (age of onset:60) in her mother; Heart attack (age of onset:80) in her father; and Heart disease (age of onset:48) in her sister. No Known Allergies Current Outpatient Prescriptions on File Prior to Visit  Medication Sig Dispense Refill  . clobetasol (TEMOVATE) 0.05 % cream Apply topically. Per Dermatologist       . folic acid (FOLVITE) 1 MG tablet Take 1 mg by mouth daily.        . hydrochlorothiazide (,MICROZIDE/HYDRODIURIL,) 12.5 MG capsule Take 12.5 mg by mouth daily.        . methotrexate (RHEUMATREX) 2.5 MG tablet Take 2.5 mg by mouth. Caution:Chemotherapy. Protect from light. Take 9 tablets by mouth once a week every Wednesday       . albuterol (PROAIR HFA) 108 (90 BASE) MCG/ACT inhaler Inhale 2 puffs into the lungs every 4 (four) hours as needed. For 2 weeks       . cephALEXin (KEFLEX) 500 MG capsule Take 500 mg by mouth 3 (three) times daily.        . solifenacin (VESICARE) 5 MG tablet Take 10 mg by mouth daily.          Review of Systems Review of Systems  Constitutional: Negative for diaphoresis, activity change, appetite change  and unexpected weight change.  HENT: Negative for hearing loss, ear pain, facial swelling, mouth sores and neck stiffness.   Eyes: Negative for pain, redness and visual disturbance.  Respiratory: Negative for shortness of breath and wheezing.   Cardiovascular: Negative for chest pain and palpitations.  Gastrointestinal: Negative for diarrhea, blood in stool, abdominal distention and rectal pain.  Genitourinary: Negative for hematuria, flank pain and decreased urine volume.  Musculoskeletal:  Negative for myalgias and joint swelling.  Skin: Negative for color change and wound.  Neurological: Negative for syncope and numbness.  Hematological: Negative for adenopathy.  Psychiatric/Behavioral: Negative for hallucinations, self-injury, decreased concentration and agitation.      Objective:   Physical Exam BP 108/72  Pulse 71  Temp(Src) 98.1 F (36.7 C) (Oral)  Resp 14  Ht 5\' 1"  (1.549 m)  Wt 160 lb 8 oz (72.802 kg)  BMI 30.33 kg/m2  SpO2 98% Physical Exam  VS noted Constitutional: Pt is oriented to person, place, and time. Appears well-developed and well-nourished.  HENT:  Head: Normocephalic and atraumatic.  Right Ear: External ear normal.  Left Ear: External ear normal.  Nose: Nose normal.  Mouth/Throat: Oropharynx is clear and moist.  Eyes: Conjunctivae and EOM are normal. Pupils are equal, round, and reactive to light.  Neck: Normal range of motion. Neck supple. No JVD present. No tracheal deviation present.  Cardiovascular: Normal rate, regular rhythm, normal heart sounds and intact distal pulses.   Pulmonary/Chest: Effort normal and breath sounds normal.  Abdominal: Soft. Bowel sounds are normal. There is no tenderness.  Musculoskeletal: Normal range of motion. Exhibits no edema.  Lymphadenopathy:  Has no cervical adenopathy.  Neurological: Pt is alert and oriented to person, place, and time. Pt has normal reflexes. No cranial nerve deficit.  Skin: Skin is warm and dry. No rash noted.  Psychiatric:  Has  normal mood and affect. Behavior is normal.         Assessment & Plan:

## 2010-09-13 NOTE — Progress Notes (Signed)
Quick Note:  Voice message left on [employer] system - lab is negative, normal or otherwise stable, pt to continue same tx ______ 

## 2010-10-21 ENCOUNTER — Encounter: Payer: Self-pay | Admitting: Gastroenterology

## 2010-10-21 ENCOUNTER — Ambulatory Visit (INDEPENDENT_AMBULATORY_CARE_PROVIDER_SITE_OTHER): Payer: BC Managed Care – PPO | Admitting: Gastroenterology

## 2010-10-21 ENCOUNTER — Other Ambulatory Visit (INDEPENDENT_AMBULATORY_CARE_PROVIDER_SITE_OTHER): Payer: BC Managed Care – PPO

## 2010-10-21 DIAGNOSIS — D509 Iron deficiency anemia, unspecified: Secondary | ICD-10-CM

## 2010-10-21 DIAGNOSIS — Z86010 Personal history of colonic polyps: Secondary | ICD-10-CM

## 2010-10-21 LAB — CBC WITH DIFFERENTIAL/PLATELET
Basophils Relative: 0.3 % (ref 0.0–3.0)
HCT: 36.6 % (ref 36.0–46.0)
Hemoglobin: 11.8 g/dL — ABNORMAL LOW (ref 12.0–15.0)
Lymphocytes Relative: 39.1 % (ref 12.0–46.0)
Lymphs Abs: 2 10*3/uL (ref 0.7–4.0)
MCHC: 32.3 g/dL (ref 30.0–36.0)
Monocytes Relative: 6.2 % (ref 3.0–12.0)
Neutro Abs: 2.8 10*3/uL (ref 1.4–7.7)
RBC: 5.16 Mil/uL — ABNORMAL HIGH (ref 3.87–5.11)
RDW: 16 % — ABNORMAL HIGH (ref 11.5–14.6)

## 2010-10-21 NOTE — Assessment & Plan Note (Signed)
She has a mild, stable anemia which could be related to thalassemia trait. This would also account for her microcytic indices. Iron studies are equivocal.  Recommendations #1 upper endoscopy #2 serial Hemoccults #3 check ferritin and repeat CBC

## 2010-10-21 NOTE — Assessment & Plan Note (Signed)
Plan follow-up colonoscopy 2019 

## 2010-10-21 NOTE — Progress Notes (Signed)
History of Present Illness:  [name] is a 60 year old African American female with a history of anemia, thalassemia, and colon polyps referred at the request of Dr. Jonny Ruiz for evaluation of anemia.  For the past 3 years she's had a hemoglobin in the mid 11 range. In 2010 serum iron was 46 and percent saturation 16. She has a remote history of colon polyps. Last colonoscopy 2009 was unremarkable. She has no history of overt GI bleeding. She rarely has seen blood on the toilet tissue. She is on no gastric irritants including nonsteroidals. She does complain of pyrosis.    Review of Systems: Pertinent positive and negative review of systems were noted in the above HPI section. All other review of systems were otherwise negative.    Current Medications, Allergies, Past Medical History, Past Surgical History, Family History and Social History were reviewed in [employer] electronic medical record  Vital signs were reviewed in today's medical record. Physical Exam: General: Well developed , well nourished, no acute distress Head: Normocephalic and atraumatic Eyes:  sclerae anicteric, EOMI Ears: Normal auditory acuity Mouth: No deformity or lesions Lungs: Clear throughout to auscultation Heart: Regular rate and rhythm; no murmurs, rubs or bruits Abdomen: Soft, non tender and non distended. No masses, hepatosplenomegaly or hernias noted. Normal Bowel sounds Rectal:deferred Musculoskeletal: Symmetrical with no gross deformities  Pulses:  Normal pulses noted Extremities: No clubbing, cyanosis, edema or deformities noted Neurological: Alert oriented x 4, grossly nonfocal Psychological:  Alert and cooperative. Normal mood and affect

## 2010-10-21 NOTE — Patient Instructions (Signed)
Go directly to the basement to have your labs drawn.  You have been scheduled for a Upper Endoscopy with separate instructions given.     Upper GI Endoscopy Upper GI endoscopy means using a flexible scope to look at the esophagus, stomach and upper small bowel. This is done to make a diagnosis in people with heartburn, abdominal pain, or abnormal bleeding. Sometimes an endoscope is needed to remove foreign bodies or food that become stuck in the esophagus; it can also be used to take biopsy samples. For the best results, do not eat or drink for 8 hours before having your upper endoscopy.  To perform the endoscopy, you will probably be sedated and your throat will be numbed with a special spray. The endoscope is then slowly passed down your throat (this will not interfere with your breathing). An endoscopy exam takes 15-30 minutes to complete and there is no real pain. Patients rarely remember much about the procedure. The results of the test may take several days if a biopsy or other test is taken.  You may have a sore throat after an endoscopy exam. Serious complications are very rare. Stick to liquids and soft foods until your pain is better. You should not drive a car or operate any dangerous equipment for at least 24 hours after being sedated. SEEK IMMEDIATE MEDICAL CARE IF:  You have severe throat pain.   You have shortness of breath.   You have bleeding problems.   You have a fever.   You have difficulty recovering from your sedation.  Document Released: 06/23/2004 Document Re-Released: 08/10/2009 [facility] Patient Information 2011 [place], [place].

## 2010-10-27 ENCOUNTER — Ambulatory Visit (AMBULATORY_SURGERY_CENTER): Payer: BC Managed Care – PPO | Admitting: Gastroenterology

## 2010-10-27 ENCOUNTER — Encounter: Payer: Self-pay | Admitting: Gastroenterology

## 2010-10-27 VITALS — Temp 98.5°F | Ht 60.0 in | Wt 158.0 lb

## 2010-10-27 DIAGNOSIS — K222 Esophageal obstruction: Secondary | ICD-10-CM

## 2010-10-27 DIAGNOSIS — D649 Anemia, unspecified: Secondary | ICD-10-CM

## 2010-10-27 MED ORDER — PANTOPRAZOLE SODIUM 40 MG PO TBEC: 40.0000 mg | DELAYED_RELEASE_TABLET | Freq: Every day | ORAL | Status: DC

## 2010-10-27 MED ORDER — SODIUM CHLORIDE 0.9 % IV SOLN: 500.0000 mL | INTRAVENOUS | Status: DC

## 2010-10-27 NOTE — Patient Instructions (Signed)
Microcytic anemia is dur to thalassemia trait.  Stricture at the gastroesophageal junction with dilatation.  Follow dilatation diet today.  Copy of dilatation diet was given to your care partner.  Dilatation will be as needed.  Resume prior medications today.  Please call if any questions or concerns.

## 2010-10-27 NOTE — Progress Notes (Signed)
Weston Brass, RN assisted pt with dressing.  No complaints on discharge. MAW

## 2010-10-28 ENCOUNTER — Telehealth: Payer: Self-pay | Admitting: *Deleted

## 2010-10-28 NOTE — Telephone Encounter (Signed)

## 2010-11-03 ENCOUNTER — Other Ambulatory Visit: Payer: Self-pay | Admitting: Internal Medicine

## 2010-11-10 ENCOUNTER — Telehealth: Payer: Self-pay | Admitting: *Deleted

## 2010-11-10 NOTE — Telephone Encounter (Signed)
Returned pts call. Informed pt Protonix not prescribed

## 2010-11-10 NOTE — Telephone Encounter (Signed)
I don't recall prescribing any meds.  OK to not fill Rx.

## 2010-11-10 NOTE — Telephone Encounter (Signed)
Dr Arlyce Dice, I received a prior authorization request for Protonix for this pt, I called her and she said she was ok at her procedure, She doesn't know anything about a protonix prescription. Does she have to take this med, if so we need to switch to omeprazole due to insurance purposes.

## 2010-12-11 ENCOUNTER — Other Ambulatory Visit: Payer: Self-pay | Admitting: Internal Medicine

## 2010-12-13 ENCOUNTER — Other Ambulatory Visit: Payer: Self-pay

## 2010-12-13 MED ORDER — HYDROCHLOROTHIAZIDE 12.5 MG PO TABS: 12.5000 mg | ORAL_TABLET | Freq: Every day | ORAL | Status: DC

## 2011-01-21 ENCOUNTER — Emergency Department (HOSPITAL_COMMUNITY): Payer: BC Managed Care – PPO

## 2011-01-21 ENCOUNTER — Emergency Department (HOSPITAL_COMMUNITY)
Admission: EM | Admit: 2011-01-21 | Discharge: 2011-01-21 | Disposition: A | Discharge status: Home / Self Care | Payer: BC Managed Care – PPO | Attending: General Surgery | Admitting: General Surgery

## 2011-01-21 DIAGNOSIS — M542 Cervicalgia: Secondary | ICD-10-CM | POA: Insufficient documentation

## 2011-01-21 DIAGNOSIS — R51 Headache: Secondary | ICD-10-CM | POA: Insufficient documentation

## 2011-01-21 DIAGNOSIS — I10 Essential (primary) hypertension: Secondary | ICD-10-CM | POA: Insufficient documentation

## 2011-01-21 DIAGNOSIS — S0993XA Unspecified injury of face, initial encounter: Secondary | ICD-10-CM | POA: Insufficient documentation

## 2011-01-21 LAB — POCT I-STAT, CHEM 8
Calcium, Ion: 1.12 mmol/L (ref 1.12–1.32)
Chloride: 100 meq/L (ref 96–112)
Glucose, Bld: 114 mg/dL — ABNORMAL HIGH (ref 70–99)
HCT: 42 % (ref 36.0–46.0)

## 2011-01-24 ENCOUNTER — Encounter: Payer: Self-pay | Admitting: Internal Medicine

## 2011-01-24 ENCOUNTER — Ambulatory Visit (INDEPENDENT_AMBULATORY_CARE_PROVIDER_SITE_OTHER): Payer: BC Managed Care – PPO | Admitting: Internal Medicine

## 2011-01-24 DIAGNOSIS — M542 Cervicalgia: Secondary | ICD-10-CM

## 2011-01-24 DIAGNOSIS — M545 Low back pain: Secondary | ICD-10-CM

## 2011-01-24 DIAGNOSIS — I10 Essential (primary) hypertension: Secondary | ICD-10-CM

## 2011-01-24 MED ORDER — TRAMADOL HCL 50 MG PO TABS: 50.0000 mg | ORAL_TABLET | Freq: Four times a day (QID) | ORAL | Status: AC | PRN

## 2011-01-24 MED ORDER — PREDNISONE 10 MG PO TABS: ORAL_TABLET | ORAL | Status: DC

## 2011-01-24 MED ORDER — CYCLOBENZAPRINE HCL 5 MG PO TABS: 5.0000 mg | ORAL_TABLET | Freq: Three times a day (TID) | ORAL | Status: AC | PRN

## 2011-01-24 NOTE — Assessment & Plan Note (Signed)
Acute on chronic recurring,, for predpack, as well as ultram/flexeril as per neck pain; exam bening, but if not improved or worsened would consider surgical referral
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
## 2011-01-24 NOTE — Patient Instructions (Addendum)
Take all new medications as prescribed Continue all other medications as before  

## 2011-01-29 ENCOUNTER — Encounter: Payer: Self-pay | Admitting: Internal Medicine
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
## 2011-01-29 NOTE — Assessment & Plan Note (Signed)
BP Readings from Last 3 Encounters:  01/24/11 110/72  10/21/10 114/72  09/13/10 108/72   stable overall by hx and exam, most recent data reviewed with pt, and pt to continue medical treatment as before

## 2011-02-01 ENCOUNTER — Telehealth: Payer: Self-pay

## 2011-02-01 NOTE — Telephone Encounter (Signed)
Patient left message regarding still having neck discomfort, but not pain. Swallowing is still a problem. Please advise.

## 2011-02-01 NOTE — Telephone Encounter (Signed)
Ok for mod barium swallow to further assess - done per emr

## 2011-02-02 NOTE — Telephone Encounter (Signed)
Called patient informed of barium swallow test that will be scheduled to further assess her swallowing problem. She agreed to have the test done.

## 2011-02-07 ENCOUNTER — Other Ambulatory Visit (HOSPITAL_COMMUNITY): Payer: Self-pay | Admitting: Physician Assistant

## 2011-02-10 ENCOUNTER — Ambulatory Visit (HOSPITAL_COMMUNITY)
Admission: RE | Admit: 2011-02-10 | Discharge: 2011-02-10 | Disposition: A | Discharge status: Home / Self Care | Payer: BC Managed Care – PPO | Source: Ambulatory Visit | Attending: Internal Medicine | Admitting: Internal Medicine

## 2011-02-10 DIAGNOSIS — R1310 Dysphagia, unspecified: Secondary | ICD-10-CM | POA: Insufficient documentation

## 2011-02-15 ENCOUNTER — Telehealth: Payer: Self-pay

## 2011-02-15 DIAGNOSIS — R1310 Dysphagia, unspecified: Secondary | ICD-10-CM

## 2011-02-15 NOTE — Telephone Encounter (Signed)
Patient notified

## 2011-02-15 NOTE — Telephone Encounter (Signed)
Ok refer GI

## 2011-02-15 NOTE — Telephone Encounter (Signed)
Patient had swallowing test and was negative. Patient is still having pain when swallowing and would like to know what to do next. She would be willing to see a specialist if you feel necessary.Call back number at work is [phone] and work number [phone]

## 2011-02-17 ENCOUNTER — Telehealth: Payer: Self-pay | Admitting: Gastroenterology

## 2011-02-17 NOTE — Telephone Encounter (Signed)
Pt scheduled to see Dr. Arlyce Dice 02/18/11@10 :30am. Dr. Raphael Gibney office to notify pt of appt date and time.

## 2011-02-18 ENCOUNTER — Encounter: Payer: Self-pay | Admitting: Gastroenterology

## 2011-02-18 ENCOUNTER — Ambulatory Visit (INDEPENDENT_AMBULATORY_CARE_PROVIDER_SITE_OTHER): Payer: BC Managed Care – PPO | Admitting: Gastroenterology

## 2011-02-18 DIAGNOSIS — R1310 Dysphagia, unspecified: Secondary | ICD-10-CM

## 2011-02-18 MED ORDER — SULINDAC 200 MG PO TABS: ORAL_TABLET | ORAL | Status: DC

## 2011-02-18 NOTE — Patient Instructions (Signed)
Your Barium Swallow is scheduled on 02/22/2011 at 9:15am at [facility]
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
## 2011-02-22 ENCOUNTER — Other Ambulatory Visit (HOSPITAL_COMMUNITY): Payer: BC Managed Care – PPO

## 2011-02-23 ENCOUNTER — Encounter: Payer: Self-pay | Admitting: Internal Medicine

## 2011-02-25 ENCOUNTER — Telehealth: Payer: Self-pay | Admitting: Gastroenterology

## 2011-02-25 NOTE — Telephone Encounter (Signed)
Pt wants to wait on her barium swallow, states she will call us back if she wants to schedule this.

## 2011-09-16 ENCOUNTER — Encounter: Payer: BC Managed Care – PPO | Admitting: Internal Medicine

## 2011-11-15 ENCOUNTER — Ambulatory Visit (INDEPENDENT_AMBULATORY_CARE_PROVIDER_SITE_OTHER): Payer: BC Managed Care – PPO | Admitting: Internal Medicine

## 2011-11-15 ENCOUNTER — Other Ambulatory Visit (INDEPENDENT_AMBULATORY_CARE_PROVIDER_SITE_OTHER): Payer: BC Managed Care – PPO

## 2011-11-15 VITALS — BP 142/78 | HR 53 | Temp 98.2°F | Ht 62.0 in | Wt 157.1 lb

## 2011-11-15 DIAGNOSIS — M545 Low back pain, unspecified: Secondary | ICD-10-CM

## 2011-11-15 DIAGNOSIS — Z0000 Encounter for general adult medical examination without abnormal findings: Secondary | ICD-10-CM

## 2011-11-15 DIAGNOSIS — M542 Cervicalgia: Secondary | ICD-10-CM

## 2011-11-15 DIAGNOSIS — R002 Palpitations: Secondary | ICD-10-CM

## 2011-11-15 DIAGNOSIS — G8929 Other chronic pain: Secondary | ICD-10-CM

## 2011-11-15 DIAGNOSIS — I10 Essential (primary) hypertension: Secondary | ICD-10-CM

## 2011-11-15 LAB — URINALYSIS, ROUTINE W REFLEX MICROSCOPIC
Bilirubin Urine: NEGATIVE
Hgb urine dipstick: NEGATIVE
Leukocytes, UA: NEGATIVE
Specific Gravity, Urine: 1.015 (ref 1.000–1.030)
Urobilinogen, UA: 0.2 (ref 0.0–1.0)

## 2011-11-15 LAB — BASIC METABOLIC PANEL
CO2: 31 mEq/L (ref 19–32)
Glucose, Bld: 91 mg/dL (ref 70–99)
Potassium: 4.1 mEq/L (ref 3.5–5.1)
Sodium: 145 mEq/L (ref 135–145)

## 2011-11-15 LAB — TSH: TSH: 0.83 u[IU]/mL (ref 0.35–5.50)

## 2011-11-15 LAB — HEPATIC FUNCTION PANEL
AST: 17 U/L (ref 0–37)
Albumin: 3.9 g/dL (ref 3.5–5.2)
Alkaline Phosphatase: 60 U/L (ref 39–117)
Total Protein: 7.6 g/dL (ref 6.0–8.3)

## 2011-11-15 LAB — LIPID PANEL
HDL: 54.8 mg/dL (ref >39.00)
LDL Cholesterol: 129 mg/dL — ABNORMAL HIGH (ref 0–99)
Total CHOL/HDL Ratio: 4
Triglycerides: 82 mg/dL (ref 0.0–149.0)
VLDL: 16.4 mg/dL (ref 0.0–40.0)

## 2011-11-15 LAB — CBC WITH DIFFERENTIAL/PLATELET
Basophils Absolute: 0 10*3/uL (ref 0.0–0.1)
Eosinophils Absolute: 0 10*3/uL (ref 0.0–0.7)
HCT: 37.1 % (ref 36.0–46.0)
Lymphocytes Relative: 41.2 % (ref 12.0–46.0)
Lymphs Abs: 2.3 10*3/uL (ref 0.7–4.0)
MCHC: 31 g/dL (ref 30.0–36.0)
Monocytes Relative: 6.1 % (ref 3.0–12.0)
Platelets: 201 10*3/uL (ref 150.0–400.0)
RDW: 16.7 % — ABNORMAL HIGH (ref 11.5–14.6)

## 2011-11-15 MED ORDER — VALSARTAN-HYDROCHLOROTHIAZIDE 160-12.5 MG PO TABS: 1.0000 | ORAL_TABLET | Freq: Every day | ORAL | Status: DC

## 2011-11-15 NOTE — Assessment & Plan Note (Signed)
Ongoing, With mild worsening pain , though neuro exam ok; for ortho referral

## 2011-11-15 NOTE — Progress Notes (Signed)
Subjective:    Patient ID: Victoria Hale, female    DOB: 06/05/1950, 61 y.o.   MRN: [id number]  HPI  Here for wellness and f/u;  Overall doing ok;  Pt denies CP, worsening SOB, DOE, wheezing, orthopnea, PND, worsening LE edema, palpitations, dizziness or syncope, except for recurrent palpitations with being rushed at work and at home, but also sometimes with lygin still in bed, occurs about once per wk..  Pt denies neurological change such as new Headache, facial or extremity weakness.  Pt denies polydipsia, polyuria, or low sugar symptoms. Pt states overall good compliance with treatment and medications, good tolerability, and trying to follow lower cholesterol diet.  Pt denies worsening depressive symptoms, suicidal ideation or panic. No fever, wt loss, night sweats, loss of appetite, or other constitutional symptoms.  Pt states good ability with ADL's, low fall risk, home safety reviewed and adequate, no significant changes in hearing or vision, and occasionally active with exercise.  Also feels quite a bit of stress being overworked at home.  Also with right neck mild burning pain for 4 mo, worse to palpate the right base of neck and trapezius area.  No radicualar pain, or numbness or weakness. Has lab drawn per dermatology for MTX use today  Pt continues to have recurring LBP without change in severity, bowel or bladder change, fever, wt loss,  worsening LE pain/numbness/weakness, gait change or falls, but pain overall worse now mod to occas severe, has to sleep in recliner sometimes at night.  Has known c3-5 DJD.  Past Medical History  Diagnosis Date  . LACTOSE INTOLERANCE 03/02/2007  . Overweight 03/02/2007  . THALASSEMIA NEC 03/02/2007  . OTITIS EXTERNA, CHRONIC NEC 03/02/2007  . HYPERTENSION, BENIGN 11/28/2005  . ALLERGIC RHINITIS 03/02/2007  . GERD 03/02/2007  . OVERACTIVE BLADDER 09/03/2009  . DERMATITIS, SEBORRHEIC 12/04/2007  . DERMATITIS, ATOPIC 03/02/2007  . PHOTOALLERGIC DERMATITIS  03/02/2007  . PSORIASIS 08/28/2008  . ALOPECIA TOTALIS 03/02/2007  . Hidradenitis 03/05/2010  . DEGENERATION, LUMBAR/LUMBOSACRAL DISC 03/02/2007  . ANOMALY, CONGENITAL, KIDNEY NEC 03/02/2007  . PALPITATIONS, RECURRENT 03/02/2007  . DYSPNEA 03/01/2007  . Cough 10/09/2008  . ANA POSITIVE 09/03/2009  . COLONIC POLYPS, HX OF 03/02/2007  . NEPHROLITHIASIS, HX OF 03/02/2007  . Unspecified anemia 10/09/2008   Past Surgical History  Procedure Date  . Exploratory abdominal surgury 1970's  . Appendectomy     ?  Marland Kitchen S/p btl   . S/p c-section twice     reports that she quit smoking about 25 years ago. She has never used smokeless tobacco. She reports that she drinks about .6 ounces of alcohol per week. She reports that she does not use illicit drugs. family history includes Breast cancer (age of onset:60) in her mother; Heart attack (age of onset:80) in her father; and Heart disease (age of onset:48) in her sister. No Known Allergies Current Outpatient Prescriptions on File Prior to Visit  Medication Sig Dispense Refill  . clobetasol (TEMOVATE) 0.05 % cream Apply topically. Per Dermatologist       . folic acid (FOLVITE) 1 MG tablet Take 0.5 mg by mouth daily.       . hydrochlorothiazide (HYDRODIURIL) 12.5 MG tablet Take 1 tablet (12.5 mg total) by mouth daily.  30 tablet  11  . methotrexate (RHEUMATREX) 2.5 MG tablet Take 2.5 mg by mouth. Caution:Chemotherapy. Protect from light. Take 3 tablets by mouth once a week every Wednesday      . sulindac (CLINORIL) 200 MG tablet Take one  tab twice a day with meals for 14 days then as needed  30 tablet  1   Current Facility-Administered Medications on File Prior to Visit  Medication Dose Route Frequency Provider Last Rate Last Dose  . 0.9 %  sodium chloride infusion  500 mL Intravenous Continuous Louis Meckel, MD       Review of Systems Review of Systems  Constitutional: Negative for diaphoresis, activity change, appetite change and unexpected weight change.    HENT: Negative for hearing loss, ear pain, facial swelling, mouth sores and neck stiffness.   Eyes: Negative for pain, redness and visual disturbance.  Respiratory: Negative for shortness of breath and wheezing.   Cardiovascular: Negative for chest pain and palpitations.  Gastrointestinal: Negative for diarrhea, blood in stool, abdominal distention and rectal pain.  Genitourinary: Negative for hematuria, flank pain and decreased urine volume.  Musculoskeletal: Negative for myalgias and joint swelling.  Skin: Negative for color change and wound.  Neurological: Negative for syncope and numbness.  Hematological: Negative for adenopathy.  Psychiatric/Behavioral: Negative for hallucinations, self-injury, decreased concentration and agitation.      Objective:   Physical Exam BP 142/78  Pulse 53  Temp 98.2 F (36.8 C) (Oral)  Ht 5\' 2"  (1.575 m)  Wt 157 lb 2 oz (71.271 kg)  BMI 28.74 kg/m2  SpO2 99% Physical Exam  VS noted Constitutional: Pt is oriented to person, place, and time. Appears well-developed and well-nourished.  HENT:  Head: Normocephalic and atraumatic.  Right Ear: External ear normal.  Left Ear: External ear normal.  Nose: Nose normal.  Mouth/Throat: Oropharynx is clear and moist.  Eyes: Conjunctivae and EOM are normal. Pupils are equal, round, and reactive to light.  Neck: Normal range of motion. Neck supple. No JVD present. No tracheal deviation present.  Cardiovascular: Normal rate, regular rhythm, normal heart sounds and intact distal pulses.   Pulmonary/Chest: Effort normal and breath sounds normal.  Abdominal: Soft. Bowel sounds are normal. There is no tenderness.  Musculoskeletal: Normal range of motion. Exhibits no edema.  Lymphadenopathy:  Has no cervical adenopathy.  Neurological: Pt is alert and oriented to person, place, and time. Pt has normal reflexes. No cranial nerve deficit. Motor intact, sens/gait intact Skin: Skin is warm and dry. No rash noted.   Psychiatric:  Has  normal mood and affect. Behavior is normal. 1+ nervous    Assessment & Plan:

## 2011-11-15 NOTE — Assessment & Plan Note (Signed)
?   SVT vs rec afib, for echo, and card referral - likely needs 30 day event monitor;  Consider beta blocker

## 2011-11-15 NOTE — Assessment & Plan Note (Signed)
Mild persistent elev at home and today's visit, for change to diovan HCT 160.12.5,  to f/u any worsening symptoms or concerns and BP at home and next visit

## 2011-11-15 NOTE — Assessment & Plan Note (Signed)

## 2011-11-15 NOTE — Assessment & Plan Note (Signed)
Mild, but recurrent persistent to right base of neck, with dysesthesia, suspect underlying cspine DJD related pain or radiculitis, overall mild and pt declines further eval at this time

## 2011-11-15 NOTE — Patient Instructions (Addendum)
Please remember to followup with your GYN for the yearly pap smear and/or mammogram as you do (later today) Ok to stop the HCTZ fluid pill Please start the generic Diovan HCT (which has the fliud pill in it) for better blood pressure control Continue all other medications as before Please have the pharmacy call with any refills you may need. You will be contacted regarding the referral for: echocardiogram, and cardiology referral You will be contacted regarding the referral for: orthopedic for the lower back and right neck Please keep your appointments with your specialists as you have planned - the rheumatologist Please go to LAB in the Basement for the blood and/or urine tests to be done today You will be contacted by phone if any changes need to be made immediately.  Otherwise, you will receive a letter about your results with an explanation. Please return in 6 months, or sooner if needed, or if your Blood Pressure at home is not mostly less than 140/90

## 2011-11-16 ENCOUNTER — Other Ambulatory Visit: Payer: Self-pay | Admitting: Internal Medicine

## 2011-11-16 ENCOUNTER — Encounter: Payer: Self-pay | Admitting: Internal Medicine

## 2011-11-16 MED ORDER — ATORVASTATIN CALCIUM 10 MG PO TABS: 10.0000 mg | ORAL_TABLET | Freq: Every day | ORAL | Status: DC

## 2011-11-20 ENCOUNTER — Encounter: Payer: Self-pay | Admitting: Internal Medicine

## 2011-11-22 ENCOUNTER — Ambulatory Visit: Payer: BC Managed Care – PPO | Admitting: Cardiovascular Disease

## 2011-11-24 ENCOUNTER — Other Ambulatory Visit (HOSPITAL_COMMUNITY): Payer: Self-pay | Admitting: Radiology

## 2011-11-24 DIAGNOSIS — R002 Palpitations: Secondary | ICD-10-CM

## 2011-11-25 ENCOUNTER — Ambulatory Visit (HOSPITAL_COMMUNITY): Payer: BC Managed Care – PPO | Attending: Internal Medicine | Admitting: Radiology

## 2011-11-25 DIAGNOSIS — I10 Essential (primary) hypertension: Secondary | ICD-10-CM | POA: Insufficient documentation

## 2011-11-25 DIAGNOSIS — R002 Palpitations: Secondary | ICD-10-CM

## 2011-11-25 DIAGNOSIS — I059 Rheumatic mitral valve disease, unspecified: Secondary | ICD-10-CM | POA: Insufficient documentation

## 2011-11-25 DIAGNOSIS — E785 Hyperlipidemia, unspecified: Secondary | ICD-10-CM | POA: Insufficient documentation

## 2011-11-25 NOTE — Progress Notes (Signed)
Echocardiogram performed by Joyce Partee.  

## 2011-11-28 ENCOUNTER — Encounter: Payer: Self-pay | Admitting: Internal Medicine

## 2011-12-28 ENCOUNTER — Ambulatory Visit (INDEPENDENT_AMBULATORY_CARE_PROVIDER_SITE_OTHER): Payer: BC Managed Care – PPO | Admitting: Cardiovascular Disease

## 2011-12-28 ENCOUNTER — Encounter: Payer: Self-pay | Admitting: Cardiovascular Disease

## 2011-12-28 VITALS — BP 123/73 | HR 59 | Ht 60.0 in | Wt 158.0 lb

## 2011-12-28 DIAGNOSIS — I10 Essential (primary) hypertension: Secondary | ICD-10-CM

## 2011-12-28 DIAGNOSIS — R002 Palpitations: Secondary | ICD-10-CM

## 2011-12-28 DIAGNOSIS — I4949 Other premature depolarization: Secondary | ICD-10-CM

## 2011-12-28 DIAGNOSIS — E782 Mixed hyperlipidemia: Secondary | ICD-10-CM

## 2011-12-28 DIAGNOSIS — I493 Ventricular premature depolarization: Secondary | ICD-10-CM

## 2011-12-28 NOTE — Patient Instructions (Addendum)
Your physician recommends that you schedule a follow-up appointment in: AS NEEDED Your physician recommends that you continue on your current medications as directed. Please refer to the Current Medication list given to you today. Your physician has recommended that you wear an event monitor. Event monitors are medical devices that record the heart's electrical activity. Doctors most often [place] these monitors to diagnose arrhythmias. Arrhythmias are problems with the speed or rhythm of the heartbeat. The monitor is a small, portable device. You can wear one while you do your normal daily activities. This is usually used to diagnose what is causing palpitations/syncope (passing out). DX PVC 'S Your physician has requested that you have an exercise tolerance test. For further information please visit [email or web address] Please also follow instruction sheet, as given. DX 785.1

## 2011-12-28 NOTE — Assessment & Plan Note (Signed)
Well controlled.  Continue current medications and low sodium Dash type diet.    

## 2011-12-28 NOTE — Progress Notes (Signed)
Patient ID: Victoria Hale, female   DOB: Nov 14, 1950, 61 y.o.   MRN: [id number] yo referred by Dr Jonny Ruiz for palpitations.  Going on for about a year.  Occur more frequently at rest.  Last a minute or two.  Rapid beats.  No associated chest pain dyspnea or presyncope.  Symptoms persistant  Occur at least weekly.  Not related to exertion.  Recent echo reviewed and normal EF 65% mild MR.  No history of CAD.  No family history of sudden death.  On good Rx for HTN and elevated lipids.  Compliant with meds  ROS: Denies fever, malais, weight loss, blurry vision, decreased visual acuity, cough, sputum, SOB, hemoptysis, pleuritic pain, palpitaitons, heartburn, abdominal pain, melena, lower extremity edema, claudication, or rash.  All other systems reviewed and negative   General: Affect appropriate Healthy:  appears stated age HEENT: normal Neck supple with no adenopathy JVP normal no bruits no thyromegaly Lungs clear with no wheezing and good diaphragmatic motion Heart:  S1/S2 no murmur,rub, gallop or click PMI normal Abdomen: benighn, BS positve, no tenderness, no AAA no bruit.  No HSM or HJR Distal pulses intact with no bruits No edema Neuro non-focal Skin warm and dry No muscular weakness  Medications Current Outpatient Prescriptions  Medication Sig Dispense Refill  . atorvastatin (LIPITOR) 10 MG tablet Take 1 tablet (10 mg total) by mouth daily.  90 tablet  3  . clobetasol (TEMOVATE) 0.05 % cream Apply topically. Per Dermatologist       . folic acid (FOLVITE) 1 MG tablet Take 0.5 mg by mouth daily.       . methotrexate (RHEUMATREX) 2.5 MG tablet Caution:Chemotherapy. Protect from light. Take 5 tablets by mouth once a week every Wednesday      . valsartan-hydrochlorothiazide (DIOVAN HCT) 160-12.5 MG per tablet Take 1 tablet by mouth daily.  90 tablet  3   Current Facility-Administered Medications  Medication Dose Route Frequency Provider Last Rate Last Dose  . 0.9 %  sodium chloride  infusion  500 mL Intravenous Continuous Louis Meckel, MD        Allergies Review of patient's allergies indicates no known allergies.  Family History: Family History  Problem Relation Age of Onset  . Breast cancer Mother 27    diagnosed  . Heart attack Father 38  . Heart disease Sister 90    had heart valve replaced    Social History: History   Social History  . Marital Status: Married    Spouse Name: N/A    Number of Children: 2  . Years of Education: N/A   Occupational History  . clerical job for [employer]    Social History Main Topics  . Smoking status: Former Smoker    Quit date: 07/27/1986  . Smokeless tobacco: Never Used  . Alcohol Use: 0.6 oz/week    1 Glasses of wine per week     glass of wine weekend  . Drug Use: No  . Sexually Active: Not on file   Other Topics Concern  . Not on file   Social History Narrative  . No narrative on file    Electrocardiogram:  11/15/11  NSR rate 57 nornmal ECG  Assessment and Plan

## 2011-12-28 NOTE — Assessment & Plan Note (Signed)
Cholesterol is at goal.  Continue current dose of statin and diet Rx.  No myalgias or side effects.  F/U  LFT's in 6 months. Lab Results  Component Value Date   LDLCALC 129* 11/15/2011

## 2011-12-28 NOTE — Assessment & Plan Note (Signed)
Echo is reassuring.  Given frequency will get 21 day event monitor.  ETT to R/O exercise induced arrhythmia.

## 2012-01-13 ENCOUNTER — Encounter (INDEPENDENT_AMBULATORY_CARE_PROVIDER_SITE_OTHER): Payer: BC Managed Care – PPO

## 2012-01-13 ENCOUNTER — Encounter: Payer: Self-pay | Admitting: Nurse Practitioner

## 2012-01-13 ENCOUNTER — Ambulatory Visit (INDEPENDENT_AMBULATORY_CARE_PROVIDER_SITE_OTHER): Payer: BC Managed Care – PPO | Admitting: Nurse Practitioner

## 2012-01-13 DIAGNOSIS — R002 Palpitations: Secondary | ICD-10-CM

## 2012-01-13 DIAGNOSIS — I493 Ventricular premature depolarization: Secondary | ICD-10-CM

## 2012-01-13 NOTE — Procedures (Signed)
Exercise Treadmill Test  Pre-Exercise Testing Evaluation Rhythm: normal sinus  Rate: 65   PR:  .17 QRS:  .09  QT:  .41 QTc: .42     Test  Exercise Tolerance Test Ordering MD: Charlton Haws, MD  Interpreting MD: Ward Givens , NP  Unique Test No: 1  Treadmill:  1  Indication for ETT: Palp  Contraindication to ETT: No   Stress Modality: exercise - treadmill  Cardiac Imaging Performed: non   Protocol: standard Bruce - maximal  Max BP: 122/57  Max MPHR (bpm):  159 85% MPR (bpm):  135  MPHR obtained (bpm):  131 % MPHR obtained:  83%  Reached 85% MPHR (min:sec):  n/a Total Exercise Time (min-sec):  8:06  Workload in METS:  10.1 Borg Scale: 17  Reason ETT Terminated:  leg cramps    ST Segment Analysis At Rest: normal ST segments - no evidence of significant ST depression With Exercise: Significant respiratory variation/baseline artifact with exercise but no obvious st/t changes noted.  Other Information Arrhythmia:  No Angina during ETT:  absent (0) Quality of ETT:  non-diagnostic  ETT Interpretation:  borderline (indeterminate) with non-specific ST changes  Comments: Fair exercise tolerance but unable to achieve target HR secondary to right hip pain.  Minimal ~ 0.59mm flat ST depression noted in II, III, aVF, V4-V6 in recovery, resolved by 5:00 recovery ECG.  No chest pain.  No arrhythmia noted during study (study ordered to r/o exercise induced arrhythmia).  Recommendations: Follow-up Dr. Eden Emms as scheduled.

## 2012-02-07 ENCOUNTER — Encounter: Payer: Self-pay | Admitting: Cardiovascular Disease

## 2012-02-07 ENCOUNTER — Ambulatory Visit (INDEPENDENT_AMBULATORY_CARE_PROVIDER_SITE_OTHER): Payer: BC Managed Care – PPO | Admitting: Cardiovascular Disease

## 2012-02-07 VITALS — BP 112/78 | HR 68 | Ht 60.0 in | Wt 157.8 lb

## 2012-02-07 DIAGNOSIS — I10 Essential (primary) hypertension: Secondary | ICD-10-CM

## 2012-02-07 DIAGNOSIS — R002 Palpitations: Secondary | ICD-10-CM

## 2012-02-07 NOTE — Patient Instructions (Signed)
Your physician recommends that you schedule a follow-up appointment in: AS NEEDED  Your physician recommends that you continue on your current medications as directed. Please refer to the Current Medication list given to you today.  

## 2012-02-07 NOTE — Progress Notes (Signed)
Patient ID: Victoria Hale, female   DOB: 1950-11-08, 61 y.o.   MRN: [id number] 61 yo referred by Dr Jonny Ruiz for palpitations. Going on for about a year. Occur more frequently at rest. Last a minute or two. Rapid beats. No associated chest pain dyspnea or presyncope. Symptoms persistant Occur at least weekly. Not related to exertion. Recent echo reviewed and normal EF 65% mild MR. No history of CAD. No family history of sudden death. On good Rx for HTN and elevated lipids. Compliant with meds  Echo 11/25/11 essentially normal with just mild MR  Normal stress test 01/13/12    Study Conclusions  - Left ventricle: The cavity size was normal. Wall thickness was normal. Systolic function was vigorous. The estimated ejection fraction was in the range of 65% to 70%. Wall motion was normal; there were no regional wall motion abnormalities. - Mitral valve: Mild regurgitation.  Reviewed Event monitor  NSR no significant arrhythmias  ROS: Denies fever, malais, weight loss, blurry vision, decreased visual acuity, cough, sputum, SOB, hemoptysis, pleuritic pain, palpitaitons, heartburn, abdominal pain, melena, lower extremity edema, claudication, or rash.  All other systems reviewed and negative  General: Affect appropriate Healthy:  appears stated age HEENT: normal Neck supple with no adenopathy JVP normal no bruits no thyromegaly Lungs clear with no wheezing and good diaphragmatic motion Heart:  S1/S2 no murmur, no rub, gallop or click PMI normal Abdomen: benighn, BS positve, no tenderness, no AAA no bruit.  No HSM or HJR Distal pulses intact with no bruits No edema Neuro non-focal Skin warm and dry No muscular weakness   Current Outpatient Prescriptions  Medication Sig Dispense Refill  . atorvastatin (LIPITOR) 10 MG tablet Take 1 tablet (10 mg total) by mouth daily.  90 tablet  3  . clobetasol (TEMOVATE) 0.05 % cream Apply topically. Per Dermatologist       . folic acid (FOLVITE) 1 MG  tablet Take 0.5 mg by mouth daily.       . methotrexate (RHEUMATREX) 2.5 MG tablet Caution:Chemotherapy. Protect from light. Take 5 tablets by mouth once a week every Wednesday      . valsartan-hydrochlorothiazide (DIOVAN HCT) 160-12.5 MG per tablet Take 1 tablet by mouth daily.  90 tablet  3   Current Facility-Administered Medications  Medication Dose Route Frequency Provider Last Rate Last Dose  . 0.9 %  sodium chloride infusion  500 mL Intravenous Continuous Louis Meckel, MD        Allergies  Review of patient's allergies indicates no known allergies.  Electrocardiogram:  NSR normal ECG 8/16  Assessment and Plan

## 2012-02-07 NOTE — Assessment & Plan Note (Signed)
Well controlled.  Continue current medications and low sodium Dash type diet.    

## 2012-02-07 NOTE — Assessment & Plan Note (Signed)
Infrequent.  Says she had none during monitoring.  Normal EF and negative ETT.  No need for further w/u

## 2012-05-02 ENCOUNTER — Other Ambulatory Visit: Payer: Self-pay | Admitting: Internal Medicine

## 2012-05-18 ENCOUNTER — Ambulatory Visit (INDEPENDENT_AMBULATORY_CARE_PROVIDER_SITE_OTHER): Payer: BC Managed Care – PPO | Admitting: Internal Medicine

## 2012-05-18 ENCOUNTER — Encounter: Payer: Self-pay | Admitting: Internal Medicine

## 2012-05-18 VITALS — BP 142/80 | HR 72 | Temp 98.6°F | Ht 61.0 in | Wt 166.5 lb

## 2012-05-18 DIAGNOSIS — M545 Low back pain: Secondary | ICD-10-CM

## 2012-05-18 DIAGNOSIS — E782 Mixed hyperlipidemia: Secondary | ICD-10-CM

## 2012-05-18 DIAGNOSIS — G8929 Other chronic pain: Secondary | ICD-10-CM

## 2012-05-18 DIAGNOSIS — I10 Essential (primary) hypertension: Secondary | ICD-10-CM

## 2012-05-18 MED ORDER — ATORVASTATIN CALCIUM 10 MG PO TABS: 10.0000 mg | ORAL_TABLET | Freq: Every day | ORAL | Status: DC

## 2012-05-18 MED ORDER — VALSARTAN-HYDROCHLOROTHIAZIDE 160-12.5 MG PO TABS: 1.0000 | ORAL_TABLET | Freq: Every day | ORAL | Status: DC

## 2012-05-18 MED ORDER — HYDROCHLOROTHIAZIDE 12.5 MG PO TABS: 12.5000 mg | ORAL_TABLET | Freq: Every day | ORAL | Status: DC

## 2012-05-18 NOTE — Patient Instructions (Addendum)
Continue all other medications as before Your refills were done today Please consider taking the Aspirin 81 mg per day No further blood or other tests needed today Please continue your efforts at being more active, low cholesterol diet, and weight control. Thank you for enrolling in MyChart. Please follow the instructions below to securely access your online medical record. MyChart allows you to send messages to your doctor, view your test results, renew your prescriptions, schedule appointments, and more. To Log into MyChart, please go to [email or web address], and your Username is: Counsellor (password: [email or web address]) Please return in 6 mo with Lab testing done 3-5 days before

## 2012-05-18 NOTE — Assessment & Plan Note (Signed)
stable overall by hx and exam, most recent data reviewed with pt, and pt to continue medical treatment as before, for lipid f/u, cont low chol diet Lab Results  Component Value Date   LDLCALC 129* 11/15/2011

## 2012-05-18 NOTE — Assessment & Plan Note (Signed)
stable overall by hx and exam, most recent data reviewed with pt, and pt to continue medical treatment as before BP Readings from Last 3 Encounters:  05/18/12 142/80  02/07/12 112/78  12/28/11 123/73

## 2012-05-18 NOTE — Assessment & Plan Note (Signed)
stable overall by hx and exam, most recent data reviewed with pt, and pt to continue medical treatment as before Lab Results  Component Value Date   WBC 5.5 11/15/2011   HGB 11.5* 11/15/2011   HCT 37.1 11/15/2011   PLT 201.0 11/15/2011   GLUCOSE 91 11/15/2011   CHOL 200 11/15/2011   TRIG 82.0 11/15/2011   HDL 54.80 11/15/2011   LDLCALC 129* 11/15/2011   ALT 12 11/15/2011   AST 17 11/15/2011   NA 145 11/15/2011   K 4.1 11/15/2011   CL 104 11/15/2011   CREATININE 0.7 11/15/2011   BUN 9 11/15/2011   CO2 31 11/15/2011   TSH 0.83 11/15/2011

## 2012-05-18 NOTE — Progress Notes (Signed)
Subjective:    Patient ID: Victoria Hale, female    DOB: 1950-08-18, 61 y.o.   MRN: [id number]  HPI   Here to f/u; overall doing ok,  Pt denies chest pain, increased sob or doe, wheezing, orthopnea, PND, increased LE swelling, palpitations, dizziness or syncope.  Pt denies new neurological symptoms such as new headache, or facial or extremity weakness or numbness   Pt denies polydipsia, polyuria,  Pt states overall good compliance with meds, trying to follow lower cholesterol,  wt increased 10 lbs in 6 mo, but little exercise however.  Declines to take asa 81 qd.  Needs med refills. Overall good compliance with treatment, and good medicine tolerability.  Denies worsening depressive symptoms, suicidal ideation, or panic. Pt continues to have recurring LBP without change in severity, bowel or bladder change, fever, wt loss,  worsening LE pain/numbness/weakness, gait change or falls. Past Medical History  Diagnosis Date  . LACTOSE INTOLERANCE 03/02/2007  . Overweight 03/02/2007  . THALASSEMIA NEC 03/02/2007  . OTITIS EXTERNA, CHRONIC NEC 03/02/2007  . HYPERTENSION, BENIGN 11/28/2005  . ALLERGIC RHINITIS 03/02/2007  . GERD 03/02/2007  . OVERACTIVE BLADDER 09/03/2009  . DERMATITIS, SEBORRHEIC 12/04/2007  . DERMATITIS, ATOPIC 03/02/2007  . PHOTOALLERGIC DERMATITIS 03/02/2007  . PSORIASIS 08/28/2008  . ALOPECIA TOTALIS 03/02/2007  . Hidradenitis 03/05/2010  . DEGENERATION, LUMBAR/LUMBOSACRAL DISC 03/02/2007  . ANOMALY, CONGENITAL, KIDNEY NEC 03/02/2007  . PALPITATIONS, RECURRENT 03/02/2007  . DYSPNEA 03/01/2007  . Cough 10/09/2008  . ANA POSITIVE 09/03/2009  . COLONIC POLYPS, HX OF 03/02/2007  . NEPHROLITHIASIS, HX OF 03/02/2007  . Unspecified anemia 10/09/2008   Past Surgical History  Procedure Date  . Exploratory abdominal surgury 1970's  . Appendectomy     ?  Marland Kitchen S/p btl   . S/p c-section twice     reports that she quit smoking about 25 years ago. She has never used smokeless tobacco. She reports that she  drinks about .6 ounces of alcohol per week. She reports that she does not use illicit drugs. family history includes Breast cancer (age of onset:60) in her mother; Heart attack (age of onset:80) in her father; and Heart disease (age of onset:48) in her sister. No Known Allergies Current Outpatient Prescriptions on File Prior to Visit  Medication Sig Dispense Refill  . atorvastatin (LIPITOR) 10 MG tablet Take 1 tablet (10 mg total) by mouth daily.  90 tablet  3  . clobetasol (TEMOVATE) 0.05 % cream Apply topically. Per Dermatologist       . folic acid (FOLVITE) 1 MG tablet Take 0.5 mg by mouth daily.       . methotrexate (RHEUMATREX) 2.5 MG tablet Caution:Chemotherapy. Protect from light. Take 5 tablets by mouth once a week every Wednesday      . valsartan-hydrochlorothiazide (DIOVAN HCT) 160-12.5 MG per tablet Take 1 tablet by mouth daily.  90 tablet  3   Current Facility-Administered Medications on File Prior to Visit  Medication Dose Route Frequency Provider Last Rate Last Dose  . 0.9 %  sodium chloride infusion  500 mL Intravenous Continuous Louis Meckel, MD       Review of Systems  Constitutional: Negative for diaphoresis and unexpected weight change.  HENT: Negative for tinnitus.   Eyes: Negative for photophobia and visual disturbance.  Respiratory: Negative for choking and stridor.   Gastrointestinal: Negative for vomiting and blood in stool.  Genitourinary: Negative for hematuria and decreased urine volume.  Musculoskeletal: Negative for gait problem.  Skin: Negative for color change  and wound.  Neurological: Negative for tremors and numbness.  Psychiatric/Behavioral: Negative for decreased concentration. The patient is not hyperactive.       Objective:   Physical Exam BP 142/80  Pulse 72  Temp 98.6 F (37 C) (Oral)  Ht 5\' 1"  (1.549 m)  Wt 166 lb 8 oz (75.524 kg)  BMI 31.46 kg/m2  SpO2 97% Physical Exam  VS noted Constitutional: Pt appears well-developed and  well-nourished.  HENT: Head: Normocephalic.  Right Ear: External ear normal.  Left Ear: External ear normal.  Eyes: Conjunctivae and EOM are normal. Pupils are equal, round, and reactive to light.  Neck: Normal range of motion. Neck supple.  Cardiovascular: Normal rate and regular rhythm.   Pulmonary/Chest: Effort normal and breath sounds normal.  Neurological: Pt is alert. Not confused  Skin: Skin is warm. No erythema.  Psychiatric: Pt behavior is normal. Thought content normal.     Assessment & Plan:

## 2012-07-14 ENCOUNTER — Other Ambulatory Visit: Payer: Self-pay

## 2012-11-16 ENCOUNTER — Ambulatory Visit: Payer: BC Managed Care – PPO | Admitting: Internal Medicine

## 2012-12-28 ENCOUNTER — Encounter: Payer: Self-pay | Admitting: Internal Medicine

## 2012-12-28 ENCOUNTER — Ambulatory Visit (INDEPENDENT_AMBULATORY_CARE_PROVIDER_SITE_OTHER): Payer: BC Managed Care – PPO | Admitting: Internal Medicine

## 2012-12-28 VITALS — BP 122/80 | HR 59 | Temp 98.8°F | Ht 60.0 in | Wt 157.1 lb

## 2012-12-28 DIAGNOSIS — I10 Essential (primary) hypertension: Secondary | ICD-10-CM

## 2012-12-28 DIAGNOSIS — K219 Gastro-esophageal reflux disease without esophagitis: Secondary | ICD-10-CM

## 2012-12-28 DIAGNOSIS — J039 Acute tonsillitis, unspecified: Secondary | ICD-10-CM

## 2012-12-28 MED ORDER — VALSARTAN-HYDROCHLOROTHIAZIDE 160-12.5 MG PO TABS: 1.0000 | ORAL_TABLET | Freq: Every day | ORAL | Status: DC

## 2012-12-28 MED ORDER — HYDROCODONE-HOMATROPINE 5-1.5 MG/5ML PO SYRP: 5.0000 mL | ORAL_SOLUTION | Freq: Four times a day (QID) | ORAL | Status: DC | PRN

## 2012-12-28 MED ORDER — AZITHROMYCIN 250 MG PO TABS: ORAL_TABLET | ORAL | Status: DC

## 2012-12-28 MED ORDER — HYDROCHLOROTHIAZIDE 12.5 MG PO TABS: 12.5000 mg | ORAL_TABLET | Freq: Every day | ORAL | Status: DC

## 2012-12-28 NOTE — Progress Notes (Signed)
Subjective:    Patient ID: Victoria Hale, female    DOB: 1951/05/05, 62 y.o.   MRN: [id number]  HPI  Here with c/o 5 days onset foregin body sensation to back of throat and mid anterior neck, seemed to start just after eating watermelon and though did not see a seed thinks a seed must somehow be stuck.  No specific sinus symptoms, dysphagia, n/v, pain, fever, ST or cough.  Pt denies chest pain, increased sob or doe, wheezing, orthopnea, PND, increased LE swelling, palpitations, dizziness or syncope.  Pt denies new neurological symptoms such as new headache, or facial or extremity weakness or numbness   Pt denies polydipsia, polyuria. Denies worsening reflux, abd pain, dysphagia, n/v, bowel change or blood. Past Medical History  Diagnosis Date  . LACTOSE INTOLERANCE 03/02/2007  . Overweight(278.02) 03/02/2007  . THALASSEMIA NEC 03/02/2007  . OTITIS EXTERNA, CHRONIC NEC 03/02/2007  . HYPERTENSION, BENIGN 11/28/2005  . ALLERGIC RHINITIS 03/02/2007  . GERD 03/02/2007  . OVERACTIVE BLADDER 09/03/2009  . DERMATITIS, SEBORRHEIC 12/04/2007  . DERMATITIS, ATOPIC 03/02/2007  . PHOTOALLERGIC DERMATITIS 03/02/2007  . PSORIASIS 08/28/2008  . ALOPECIA TOTALIS 03/02/2007  . Hidradenitis 03/05/2010  . DEGENERATION, LUMBAR/LUMBOSACRAL DISC 03/02/2007  . ANOMALY, CONGENITAL, KIDNEY NEC 03/02/2007  . PALPITATIONS, RECURRENT 03/02/2007  . DYSPNEA 03/01/2007  . Cough 10/09/2008  . ANA POSITIVE 09/03/2009  . COLONIC POLYPS, HX OF 03/02/2007  . NEPHROLITHIASIS, HX OF 03/02/2007  . Unspecified anemia 10/09/2008   Past Surgical History  Procedure Laterality Date  . Exploratory abdominal surgury  1970's  . Appendectomy      ?  Marland Kitchen S/p btl    . S/p c-section twice      reports that she quit smoking about 26 years ago. She has never used smokeless tobacco. She reports that she drinks about 0.6 ounces of alcohol per week. She reports that she does not use illicit drugs. family history includes Breast cancer (age of onset: 71) in  her mother; Heart attack (age of onset: 20) in her father; and Heart disease (age of onset: 42) in her sister. No Known Allergies Current Outpatient Prescriptions on File Prior to Visit  Medication Sig Dispense Refill  . atorvastatin (LIPITOR) 10 MG tablet Take 1 tablet (10 mg total) by mouth daily.  90 tablet  3  . clobetasol (TEMOVATE) 0.05 % cream Apply topically. Per Dermatologist       . folic acid (FOLVITE) 1 MG tablet Take 0.5 mg by mouth daily.       . methotrexate (RHEUMATREX) 2.5 MG tablet Caution:Chemotherapy. Protect from light. Take 5 tablets by mouth once a week every Wednesday       Current Facility-Administered Medications on File Prior to Visit  Medication Dose Route Frequency Provider Last Rate Last Dose  . 0.9 %  sodium chloride infusion  500 mL Intravenous Continuous Louis Meckel, MD       Review of Systems  Constitutional: Negative for unexpected weight change, or unusual diaphoresis  HENT: Negative for tinnitus.   Eyes: Negative for photophobia and visual disturbance.  Respiratory: Negative for choking and stridor.   Gastrointestinal: Negative for vomiting and blood in stool.  Genitourinary: Negative for hematuria and decreased urine volume.  Musculoskeletal: Negative for acute joint swelling Skin: Negative for color change and wound.  Neurological: Negative for tremors and numbness other than noted  Psychiatric/Behavioral: Negative for decreased concentration or  hyperactivity.       Objective:   Physical Exam BP 122/80  Pulse  59  Temp(Src) 98.8 F (37.1 C) (Oral)  Ht 5' (1.524 m)  Wt 157 lb 2 oz (71.271 kg)  BMI 30.69 kg/m2  SpO2 98% VS noted,  Constitutional: Pt appears well-developed and well-nourished.  HENT: Head: NCAT.  Right Ear: External ear normal.  Left Ear: External ear normal.  Eyes: Conjunctivae and EOM are normal. Pupils are equal, round, and reactive to light.  Bilat tm's with mild erythema.  Max sinus areas non tender.  Pharynx with  mild erythema, no exudate but tonsils 2+ post pharynx with erythema, no crypts or stones noted Neck: Normal range of motion. Neck supple.  Cardiovascular: Normal rate and regular rhythm.   Pulmonary/Chest: Effort normal and breath sounds normal.  Abd:  Soft, NT, non-distended, + BS Neurological: Pt is alert. Not confused  Skin: Skin is warm. No erythema.  Psychiatric: Pt behavior is normal. Thought content normal.     Assessment & Plan:

## 2012-12-28 NOTE — Patient Instructions (Signed)
Please take all new medication as prescribed - the antibiotic (and cough medicine if needed) Please continue all other medications as before, and refills have been done if requested. Please have the pharmacy call with any other refills you may need. You will be contacted regarding the referral for: ENT (To see [facility] now, for appt next Tuesday if possible)  Please remember to sign up for My Chart if you have not done so, as this will be important to you in the future with finding out test results, communicating by private email, and scheduling acute appointments online when needed.

## 2012-12-28 NOTE — Assessment & Plan Note (Signed)
With ? Tonsillith - suggestive by hx, doubt watermelon seed could do this, for empiric zpack x 1, refer ENT (pt requests next tues appt due to work schedule)

## 2012-12-29 NOTE — Assessment & Plan Note (Signed)
stable overall by history and exam, recent data reviewed with pt, and pt to continue medical treatment as before,  to f/u any worsening symptoms or concerns   BP Readings from Last 3 Encounters:  12/28/12 122/80  05/18/12 142/80  02/07/12 112/78

## 2012-12-29 NOTE — Assessment & Plan Note (Signed)
stable overall by history and exam, recent data reviewed with pt, and pt to continue medical treatment as before,  to f/u any worsening symptoms or concerns Lab Results  Component Value Date   WBC 5.5 11/15/2011   HGB 11.5* 11/15/2011   HCT 37.1 11/15/2011   PLT 201.0 11/15/2011   GLUCOSE 91 11/15/2011   CHOL 200 11/15/2011   TRIG 82.0 11/15/2011   HDL 54.80 11/15/2011   LDLCALC 129* 11/15/2011   ALT 12 11/15/2011   AST 17 11/15/2011   NA 145 11/15/2011   K 4.1 11/15/2011   CL 104 11/15/2011   CREATININE 0.7 11/15/2011   BUN 9 11/15/2011   CO2 31 11/15/2011   TSH 0.83 11/15/2011

## 2013-04-04 ENCOUNTER — Other Ambulatory Visit: Payer: Self-pay

## 2013-07-11 ENCOUNTER — Other Ambulatory Visit: Payer: Self-pay | Admitting: Internal Medicine

## 2014-06-26 ENCOUNTER — Encounter: Payer: Self-pay | Admitting: Internal Medicine

## 2014-06-26 ENCOUNTER — Ambulatory Visit (INDEPENDENT_AMBULATORY_CARE_PROVIDER_SITE_OTHER): Payer: 59 | Admitting: Internal Medicine

## 2014-06-26 VITALS — BP 140/84 | HR 75 | Temp 98.9°F | Ht 61.0 in | Wt 169.2 lb

## 2014-06-26 DIAGNOSIS — L408 Other psoriasis: Secondary | ICD-10-CM

## 2014-06-26 DIAGNOSIS — Z0189 Encounter for other specified special examinations: Secondary | ICD-10-CM

## 2014-06-26 DIAGNOSIS — Z0000 Encounter for general adult medical examination without abnormal findings: Secondary | ICD-10-CM

## 2014-06-26 MED ORDER — HYDROCHLOROTHIAZIDE 12.5 MG PO TABS: 12.5000 mg | ORAL_TABLET | Freq: Every day | ORAL | Status: DC

## 2014-06-26 MED ORDER — ATORVASTATIN CALCIUM 10 MG PO TABS: 10.0000 mg | ORAL_TABLET | Freq: Every day | ORAL | Status: DC

## 2014-06-26 MED ORDER — VALSARTAN-HYDROCHLOROTHIAZIDE 160-12.5 MG PO TABS: 1.0000 | ORAL_TABLET | Freq: Every day | ORAL | Status: DC

## 2014-06-26 MED ORDER — ASPIRIN EC 81 MG PO TBEC: 81.0000 mg | DELAYED_RELEASE_TABLET | Freq: Every day | ORAL | Status: DC

## 2014-06-26 NOTE — Progress Notes (Signed)
Pre visit review using our clinic review tool, if applicable. No additional management support is needed unless otherwise documented below in the visit note. 

## 2014-06-26 NOTE — Patient Instructions (Addendum)
Please continue all other medications as before, and refills have been done if requested.  Please have the pharmacy call with any other refills you may need.  Please continue your efforts at being more active, low cholesterol diet, and weight control.  You are otherwise up to date with prevention measures today.  Please keep your appointments with your specialists as you may have planned  You will be contacted regarding the referral for: dermatology, mammogram, GYN referral for pap smear  Please go to the LAB in the Basement (turn left off the elevator) for the tests to be done tomorrow or next day  You will be contacted by phone if any changes need to be made immediately.  Otherwise, you will receive a letter about your results with an explanation, but please check with MyChart first.  Please remember to sign up for MyChart if you have not done so, as this will be important to you in the future with finding out test results, communicating by private email, and scheduling acute appointments online when needed.  Please return in 6 months

## 2014-06-26 NOTE — Progress Notes (Signed)
Subjective:    Patient ID: Victoria Hale, female    DOB: January 14, 1951, 64 y.o.   MRN: [id number]  HPI  Here for wellness and f/u;  Overall doing ok;  Pt denies CP, worsening SOB, DOE, wheezing, orthopnea, PND, worsening LE edema, palpitations, dizziness or syncope.  Pt denies neurological change such as new headache, facial or extremity weakness.  Pt denies polydipsia, polyuria, or low sugar symptoms. Pt states overall good compliance with treatment and medications, good tolerability, and has been trying to follow lower cholesterol diet.  Pt denies worsening depressive symptoms, suicidal ideation or panic. No fever, night sweats, wt loss, loss of appetite, or other constitutional symptoms.  Pt states good ability with ADL's, has low fall risk, home safety reviewed and adequate, no other significant changes in hearing or vision, and only occasionally active with exercise.  Due for pap, mammogram.  Only takes BP med "occas"  Not taking asa.   Declines flu shot. Needs local derm referral for f/u flare of dermatitis, cannot travel to out of town derm due to cost Past Medical History  Diagnosis Date  . LACTOSE INTOLERANCE 03/02/2007  . Overweight(278.02) 03/02/2007  . THALASSEMIA NEC 03/02/2007  . OTITIS EXTERNA, CHRONIC NEC 03/02/2007  . HYPERTENSION, BENIGN 11/28/2005  . ALLERGIC RHINITIS 03/02/2007  . GERD 03/02/2007  . OVERACTIVE BLADDER 09/03/2009  . DERMATITIS, SEBORRHEIC 12/04/2007  . DERMATITIS, ATOPIC 03/02/2007  . PHOTOALLERGIC DERMATITIS 03/02/2007  . PSORIASIS 08/28/2008  . ALOPECIA TOTALIS 03/02/2007  . Hidradenitis 03/05/2010  . DEGENERATION, LUMBAR/LUMBOSACRAL DISC 03/02/2007  . ANOMALY, CONGENITAL, KIDNEY NEC 03/02/2007  . PALPITATIONS, RECURRENT 03/02/2007  . DYSPNEA 03/01/2007  . Cough 10/09/2008  . ANA POSITIVE 09/03/2009  . COLONIC POLYPS, HX OF 03/02/2007  . NEPHROLITHIASIS, HX OF 03/02/2007  . Unspecified anemia 10/09/2008   Past Surgical History  Procedure Laterality Date  . Exploratory  abdominal surgury  1970's  . Appendectomy      ?  Marland Kitchen S/p btl    . S/p c-section twice      reports that she quit smoking about 27 years ago. She has never used smokeless tobacco. She reports that she drinks about 0.6 oz of alcohol per week. She reports that she does not use illicit drugs. family history includes Breast cancer (age of onset: 69) in her mother; Heart attack (age of onset: 32) in her father; Heart disease (age of onset: 74) in her sister. No Known Allergies Current Outpatient Prescriptions on File Prior to Visit  Medication Sig Dispense Refill  . clobetasol (TEMOVATE) 0.05 % cream Apply topically. Per Dermatologist     . folic acid (FOLVITE) 1 MG tablet Take 0.5 mg by mouth daily.     . methotrexate (RHEUMATREX) 2.5 MG tablet Caution:Chemotherapy. Protect from light. Take 5 tablets by mouth once a week every Wednesday     Current Facility-Administered Medications on File Prior to Visit  Medication Dose Route Frequency Provider Last Rate Last Dose  . 0.9 %  sodium chloride infusion  500 mL Intravenous Continuous Louis Meckel, MD        Review of Systems Constitutional: Negative for increased diaphoresis, other activity, appetite or other siginficant weight change  HENT: Negative for worsening hearing loss, ear pain, facial swelling, mouth sores and neck stiffness.   Eyes: Negative for other worsening pain, redness or visual disturbance.  Respiratory: Negative for shortness of breath and wheezing.   Cardiovascular: Negative for chest pain and palpitations.  Gastrointestinal: Negative for diarrhea, blood in stool,  abdominal distention or other pain Genitourinary: Negative for hematuria, flank pain or change in urine volume.  Musculoskeletal: Negative for myalgias or other joint complaints.  Skin: Negative for color change and wound.  Neurological: Negative for syncope and numbness. other than noted Hematological: Negative for adenopathy. or other  swelling Psychiatric/Behavioral: Negative for hallucinations, self-injury, decreased concentration or other worsening agitation.      Objective:   Physical Exam BP 140/84 mmHg  Pulse 75  Temp(Src) 98.9 F (37.2 C) (Oral)  Ht 5\' 1"  (1.549 m)  Wt 169 lb 4 oz (76.771 kg)  BMI 32.00 kg/m2  SpO2 95% VS noted,  Constitutional: Pt is oriented to person, place, and time. Appears well-developed and well-nourished.  Head: Normocephalic and atraumatic.  Right Ear: External ear normal.  Left Ear: External ear normal.  Nose: Nose normal.  Mouth/Throat: Oropharynx is clear and moist.  Eyes: Conjunctivae and EOM are normal. Pupils are equal, round, and reactive to light.  Neck: Normal range of motion. Neck supple. No JVD present. No tracheal deviation present.  Cardiovascular: Normal rate, regular rhythm, normal heart sounds and intact distal pulses.   Pulmonary/Chest: Effort normal and breath sounds without rales or wheezing  Abdominal: Soft. Bowel sounds are normal. NT. No HSM  Musculoskeletal: Normal range of motion. Exhibits no edema.  Lymphadenopathy:  Has no cervical adenopathy.  Neurological: Pt is alert and oriented to person, place, and time. Pt has normal reflexes. No cranial nerve deficit. Motor grossly intact Skin: Skin is warm and dry. No rash noted.  Psychiatric:  Has normal mood and affect. Behavior is normal.     Assessment & Plan:

## 2014-06-27 ENCOUNTER — Other Ambulatory Visit (INDEPENDENT_AMBULATORY_CARE_PROVIDER_SITE_OTHER): Payer: 59

## 2014-06-27 DIAGNOSIS — Z0189 Encounter for other specified special examinations: Secondary | ICD-10-CM

## 2014-06-27 LAB — CBC WITH DIFFERENTIAL/PLATELET
BASOS PCT: 0.4 % (ref 0.0–3.0)
Basophils Absolute: 0 10*3/uL (ref 0.0–0.1)
EOS ABS: 0 10*3/uL (ref 0.0–0.7)
EOS PCT: 0.5 % (ref 0.0–5.0)
HCT: 37.9 % (ref 36.0–46.0)
HEMOGLOBIN: 12.3 g/dL (ref 12.0–15.0)
Lymphocytes Relative: 38.1 % (ref 12.0–46.0)
Lymphs Abs: 1.9 10*3/uL (ref 0.7–4.0)
MCHC: 32.4 g/dL (ref 30.0–36.0)
MCV: 67.7 fl — ABNORMAL LOW (ref 78.0–100.0)
MONO ABS: 0.3 10*3/uL (ref 0.1–1.0)
Monocytes Relative: 5 % (ref 3.0–12.0)
Neutro Abs: 2.8 10*3/uL (ref 1.4–7.7)
Neutrophils Relative %: 56 % (ref 43.0–77.0)
Platelets: 209 10*3/uL (ref 150.0–400.0)
RBC: 5.59 Mil/uL — AB (ref 3.87–5.11)
RDW: 16 % — ABNORMAL HIGH (ref 11.5–15.5)
WBC: 5.1 10*3/uL (ref 4.0–10.5)

## 2014-06-27 LAB — HEPATIC FUNCTION PANEL
ALT: 10 U/L (ref 0–35)
AST: 17 U/L (ref 0–37)
Albumin: 4.1 g/dL (ref 3.5–5.2)
Alkaline Phosphatase: 90 U/L (ref 39–117)
Bilirubin, Direct: 0.1 mg/dL (ref 0.0–0.3)
TOTAL PROTEIN: 7.7 g/dL (ref 6.0–8.3)
Total Bilirubin: 0.7 mg/dL (ref 0.2–1.2)

## 2014-06-27 LAB — LIPID PANEL
CHOL/HDL RATIO: 3
Cholesterol: 203 mg/dL — ABNORMAL HIGH (ref 0–200)
HDL: 60.4 mg/dL (ref >39.00)
LDL Cholesterol: 121 mg/dL — ABNORMAL HIGH (ref 0–99)
NonHDL: 142.6
TRIGLYCERIDES: 110 mg/dL (ref 0.0–149.0)
VLDL: 22 mg/dL (ref 0.0–40.0)

## 2014-06-27 LAB — BASIC METABOLIC PANEL
BUN: 13 mg/dL (ref 6–23)
CO2: 26 mEq/L (ref 19–32)
CREATININE: 0.76 mg/dL (ref 0.40–1.20)
Calcium: 9.3 mg/dL (ref 8.4–10.5)
Chloride: 105 mEq/L (ref 96–112)
GFR: 98.53 mL/min (ref >60.00)
GLUCOSE: 95 mg/dL (ref 70–99)
Potassium: 3.7 mEq/L (ref 3.5–5.1)
Sodium: 139 mEq/L (ref 135–145)

## 2014-06-27 LAB — TSH: TSH: 1.95 u[IU]/mL (ref 0.35–4.50)

## 2014-06-28 NOTE — Assessment & Plan Note (Signed)
Ok for derm referral,  to f/u any worsening symptoms or concerns 

## 2014-06-28 NOTE — Assessment & Plan Note (Signed)

## 2014-07-08 ENCOUNTER — Other Ambulatory Visit: Payer: Self-pay | Admitting: Internal Medicine

## 2014-07-08 DIAGNOSIS — Z1231 Encounter for screening mammogram for malignant neoplasm of breast: Secondary | ICD-10-CM

## 2014-07-15 ENCOUNTER — Other Ambulatory Visit (INDEPENDENT_AMBULATORY_CARE_PROVIDER_SITE_OTHER): Payer: 59

## 2014-07-15 DIAGNOSIS — Z0000 Encounter for general adult medical examination without abnormal findings: Secondary | ICD-10-CM

## 2014-07-15 DIAGNOSIS — Z0189 Encounter for other specified special examinations: Secondary | ICD-10-CM

## 2014-07-15 LAB — URINALYSIS, ROUTINE W REFLEX MICROSCOPIC
Bilirubin Urine: NEGATIVE
Hgb urine dipstick: NEGATIVE
Leukocytes, UA: NEGATIVE
Nitrite: NEGATIVE
PH: 6 (ref 5.0–8.0)
Total Protein, Urine: NEGATIVE
URINE GLUCOSE: NEGATIVE
Urobilinogen, UA: 0.2 (ref 0.0–1.0)

## 2014-08-05 ENCOUNTER — Ambulatory Visit
Admission: RE | Admit: 2014-08-05 | Discharge: 2014-08-05 | Disposition: A | Discharge status: Home / Self Care | Payer: 59 | Source: Ambulatory Visit | Attending: Internal Medicine | Admitting: Internal Medicine

## 2014-08-05 DIAGNOSIS — Z1231 Encounter for screening mammogram for malignant neoplasm of breast: Secondary | ICD-10-CM

## 2014-08-07 ENCOUNTER — Encounter: Payer: Self-pay | Admitting: Internal Medicine

## 2014-08-20 ENCOUNTER — Ambulatory Visit (INDEPENDENT_AMBULATORY_CARE_PROVIDER_SITE_OTHER): Payer: 59 | Admitting: Gynecology

## 2014-08-20 ENCOUNTER — Encounter: Payer: Self-pay | Admitting: Gynecology

## 2014-08-20 ENCOUNTER — Other Ambulatory Visit (HOSPITAL_COMMUNITY)
Admission: RE | Admit: 2014-08-20 | Discharge: 2014-08-20 | Disposition: A | Discharge status: Home / Self Care | Payer: 59 | Source: Ambulatory Visit | Attending: Gynecology | Admitting: Gynecology

## 2014-08-20 VITALS — BP 122/78 | Ht 60.75 in | Wt 166.0 lb

## 2014-08-20 DIAGNOSIS — Z1151 Encounter for screening for human papillomavirus (HPV): Secondary | ICD-10-CM | POA: Insufficient documentation

## 2014-08-20 DIAGNOSIS — Z01419 Encounter for gynecological examination (general) (routine) without abnormal findings: Secondary | ICD-10-CM | POA: Insufficient documentation

## 2014-08-20 DIAGNOSIS — Z780 Asymptomatic menopausal state: Secondary | ICD-10-CM | POA: Diagnosis not present

## 2014-08-20 NOTE — Patient Instructions (Addendum)
Shingles Vaccine What You Need to Know WHAT IS SHINGLES?  Shingles is a painful skin rash, often with blisters. It is also called Herpes Zoster or just Zoster.  A shingles rash usually appears on one side of the face or body and lasts from 2 to 4 weeks. Its main symptom is pain, which can be quite severe. Other symptoms of shingles can include fever, headache, chills, and upset stomach. Very rarely, a shingles infection can lead to pneumonia, hearing problems, blindness, brain inflammation (encephalitis), or death.  For about 1 person in 5, severe pain can continue even after the rash clears up. This is called post-herpetic neuralgia.  Shingles is caused by the Varicella Zoster virus. This is the same virus that causes chickenpox. Only someone who has had a case of chickenpox or rarely, has gotten chickenpox vaccine, can get shingles. The virus stays in your body. It can reappear many years later to cause a case of shingles.  You cannot catch shingles from another person with shingles. However, a person who has never had chickenpox (or chickenpox vaccine) could get chickenpox from someone with shingles. This is not very common.  Shingles is far more common in people 50 and older than in younger people. It is also more common in people whose immune systems are weakened because of a disease such as cancer or drugs such as steroids or chemotherapy.  At least 1 million people get shingles per year in the [place]. SHINGLES VACCINE  A vaccine for shingles was licensed in 2006. In clinical trials, the vaccine reduced the risk of shingles by 50%. It can also reduce the pain in people who still get shingles after being vaccinated.  A single dose of shingles vaccine is recommended for adults 60 years of age and older. SOME PEOPLE SHOULD NOT GET SHINGLES VACCINE OR SHOULD WAIT A person should not get shingles vaccine if he or she:  Has ever had a life-threatening allergic reaction to gelatin, the  antibiotic neomycin, or any other component of shingles vaccine. Tell your caregiver if you have any severe allergies.  Has a weakened immune system because of current:  AIDS or another disease that affects the immune system.  Treatment with drugs that affect the immune system, such as prolonged use of high-dose steroids.  Cancer treatment, such as radiation or chemotherapy.  Cancer affecting the bone marrow or lymphatic system, such as leukemia or lymphoma.  Is pregnant, or might be pregnant. Women should not become pregnant until at least 4 weeks after getting shingles vaccine. Someone with a minor illness, such as a cold, may be vaccinated. Anyone with a moderate or severe acute illness should usually wait until he or she recovers before getting the vaccine. This includes anyone with a temperature of 101.3 F (38 C) or higher. WHAT ARE THE RISKS FROM SHINGLES VACCINE?  A vaccine, like any medicine, could possibly cause serious problems, such as severe allergic reactions. However, the risk of a vaccine causing serious harm, or death, is extremely small.  No serious problems have been identified with shingles vaccine. Mild Problems  Redness, soreness, swelling, or itching at the site of the injection (about 1 person in 3).  Headache (about 1 person in 70). Like all vaccines, shingles vaccine is being closely monitored for unusual or severe problems. WHAT IF THERE IS A MODERATE OR SEVERE REACTION? What should I look for? Any unusual condition, such as a severe allergic reaction or a high fever. If a severe allergic reaction   occurred, it would be within a few minutes to an hour after the shot. Signs of a serious allergic reaction can include difficulty breathing, weakness, hoarseness or wheezing, a fast heartbeat, hives, dizziness, paleness, or swelling of the throat. What should I do?  Call your caregiver, or get the person to a caregiver right away.  Tell the caregiver what  happened, the date and time it happened, and when the vaccination was given.  Ask the caregiver to report the reaction by filing a Vaccine Adverse Event Reporting System (VAERS) form. Or, you can file this report through the VAERS web site at www.vaers.hhs.gov or by calling 1-800-822-7967. VAERS does not provide medical advice. HOW CAN I LEARN MORE?  Ask your caregiver. He or she can give you the vaccine package insert or suggest other sources of information.  Contact the Centers for Disease Control and Prevention (CDC):  Call 1-800-232-4636 (1-800-CDC-INFO).  Visit the CDC website at www.cdc.gov/vaccines CDC Shingles Vaccine VIS (03/04/08) Document Released: 03/13/2006 Document Revised: 08/08/2011 Document Reviewed: 09/05/2012 ExitCare Patient Information 2015 [employer]. This information is not intended to replace advice given to you by your health care provider. Make sure you discuss any questions you have with your health care provider. Bone Densitometry Bone densitometry is a special X-ray that measures your bone density and can be used to help predict your risk of bone fractures. This test is used to determine bone mineral content and density to diagnose osteoporosis. Osteoporosis is the loss of bone that may cause the bone to become weak. Osteoporosis commonly occurs in women entering menopause. However, it may be found in men and in people with other diseases. PREPARATION FOR TEST No preparation necessary. WHO SHOULD BE TESTED?  All women older than 65.  Postmenopausal women (50 to 65) with risk factors for osteoporosis.  People with a previous fracture caused by normal activities.  People with a small body frame (less than 127 poundsor a body mass index [BMI] of less than 21).  People who have a parent with a hip fracture or history of osteoporosis.  People who smoke.  People who have rheumatoid arthritis.  Anyone who engages in excessive alcohol use (more than 3  drinks most days).  Women who experience early menopause. WHEN SHOULD YOU BE RETESTED? Current guidelines suggest that you should wait at least 2 years before doing a bone density test again if your first test was normal.Recent studies indicated that women with normal bone density may be able to wait a few years before needing to repeat a bone density test. You should discuss this with your caregiver.  NORMAL FINDINGS   Normal: less than standard deviation below normal (greater than -1).  Osteopenia: 1 to 2.5 standard deviations below normal (-1 to -2.5).  Osteoporosis: greater than 2.5 standard deviations below normal (less than -2.5). Test results are reported as a "T score" and a "Z score."The T score is a number that compares your bone density with the bone density of healthy, young women.The Z score is a number that compares your bone density with the scores of women who are the same age, gender, and race.  Ranges for normal findings may vary among different laboratories and hospitals. You should always check with your doctor after having lab work or other tests done to discuss the meaning of your test results and whether your values are considered within normal limits. MEANING OF TEST  Your caregiver will go over the test results with you and discuss the importance   and meaning of your results, as well as treatment options and the need for additional tests if necessary. OBTAINING THE TEST RESULTS It is your responsibility to obtain your test results. Ask the lab or department performing the test when and how you will get your results. Document Released: 06/07/2004 Document Revised: 08/08/2011 Document Reviewed: 06/30/2010 Exit[employer] Patient Information 2015 [employer]. This information is not intended to replace advice given to you by your health care provider. Make sure you discuss any questions you have with your health care provider.  

## 2014-08-20 NOTE — Addendum Note (Signed)
Addended by: Berna SpareASTILLO, BLANCA A on: 08/20/2014 04:12 PM   Modules accepted: Orders

## 2014-08-20 NOTE — Progress Notes (Signed)
Victoria Hale 1950/10/19 [id number]   History:    64 y.o.  for annual gyn exam who is being followed by Dr. Oliver Barre her primary care physician. Patient has not had a gynecological exam in many years. She denies any past history of any abnormal Pap smears. She reports history of benign colon polyps removed in 2012. Her mother had breast cancer the age of 72 patient had a normal mammogram in March of this year patient infrequently does her self breast exams. Patient has never been on any hormonal replacement therapy. Patient denies any vaginal bleeding. Patient not sexually active. She stated over 5 years ago in his separate medical facility she had a bone density study but we have no report she stated was reported to be normal. She is currently not taking any calcium or vitamin D. Patient suffers from seborrheic dermatitis as well as history of alopecia totalis.  Past medical history,surgical history, family history and social history were all reviewed and documented in the EPIC chart.  Gynecologic History No LMP recorded. Patient is postmenopausal. Contraception: post menopausal status Last Pap: 2009. Results were: normal Last mammogram: 2016. Results were: normal  Obstetric History OB History  Gravida Para Term Preterm AB SAB TAB Ectopic Multiple Living  # Outcome Date GA Lbr Len/2nd Weight Sex Delivery Anes PTL Lv  2 Para           1 Para                ROS: A ROS was performed and pertinent positives and negatives are included in the history.  GENERAL: No fevers or chills. HEENT: No change in vision, no earache, sore throat or sinus congestion. NECK: No pain or stiffness. CARDIOVASCULAR: No chest pain or pressure. No palpitations. PULMONARY: No shortness of breath, cough or wheeze. GASTROINTESTINAL: No abdominal pain, nausea, vomiting or diarrhea, melena or bright red blood per rectum. GENITOURINARY: No urinary frequency, urgency, hesitancy or dysuria.  MUSCULOSKELETAL: No joint or muscle pain, no back pain, no recent trauma. DERMATOLOGIC: No rash, no itching, no lesions. ENDOCRINE: No polyuria, polydipsia, no heat or cold intolerance. No recent change in weight. HEMATOLOGICAL: No anemia or easy bruising or bleeding. NEUROLOGIC: No headache, seizures, numbness, tingling or weakness. PSYCHIATRIC: No depression, no loss of interest in normal activity or change in sleep pattern.     Exam: chaperone present  BP 122/78 mmHg  Ht 5' 0.75" (1.543 m)  Wt 166 lb (75.297 kg)  BMI 31.63 kg/m2  Body mass index is 31.63 kg/(m^2).  General appearance : Well developed well nourished female. No acute distress HEENT: Eyes: no retinal hemorrhage or exudates,  Neck supple, trachea midline, no carotid bruits, no thyroidmegaly Lungs: Clear to auscultation, no rhonchi or wheezes, or rib retractions  Heart: Regular rate and rhythm, no murmurs or gallops Breast:Examined in sitting and supine position were symmetrical in appearance, no palpable masses or tenderness,  no skin retraction, no nipple inversion, no nipple discharge, no skin discoloration, no axillary or supraclavicular lymphadenopathy Abdomen: no palpable masses or tenderness, no rebound or guarding Extremities: no edema or skin discoloration or tenderness  Pelvic:  Bartholin, Urethra, Skene Glands: Within normal limits             Vagina: No gross lesions or discharge, vaginal atrophy  Cervix: No gross lesions or discharge  Uterus  anteverted, normal size, shape and consistency, non-tender and mobile  Adnexa  Without masses or tenderness  Anus and perineum  normal   Rectovaginal  normal sphincter tone without palpated masses or tenderness             Hemoccult PCP provides     Assessment/Plan:  64 y.o. female for annual exam with vaginal atrophy not sexually active asymptomatic does not want to be put on any vaginal estrogen. She can buy over-the-counter Replens vaginal moisturizer. A Pap smear  with HPV screening was done today. Patient was schedule here in the office of bone density study in the next few weeks. Blood work done by her PCP. Patient was reminded on the importance of calcium and vitamin D and regular exercise for osteoporosis prevention. We also described and ports of monthly breast exam.   Ok EdwardsFERNANDEZ,Elbony Mcclimans H MD, 3:32 PM 08/20/2014

## 2014-08-22 LAB — CYTOLOGY - PAP

## 2014-10-02 ENCOUNTER — Ambulatory Visit (INDEPENDENT_AMBULATORY_CARE_PROVIDER_SITE_OTHER): Payer: 59

## 2014-10-02 ENCOUNTER — Other Ambulatory Visit: Payer: Self-pay | Admitting: Gynecology

## 2014-10-02 DIAGNOSIS — Z13820 Encounter for screening for osteoporosis: Secondary | ICD-10-CM | POA: Diagnosis not present

## 2014-10-02 DIAGNOSIS — Z780 Asymptomatic menopausal state: Secondary | ICD-10-CM

## 2015-06-10 ENCOUNTER — Encounter: Payer: Self-pay | Admitting: Gastroenterology

## 2015-07-10 ENCOUNTER — Other Ambulatory Visit: Payer: Self-pay | Admitting: Internal Medicine

## 2015-10-02 ENCOUNTER — Encounter: Payer: Self-pay | Admitting: Internal Medicine

## 2015-10-02 ENCOUNTER — Ambulatory Visit (INDEPENDENT_AMBULATORY_CARE_PROVIDER_SITE_OTHER): Payer: Medicare Other | Admitting: Internal Medicine

## 2015-10-02 ENCOUNTER — Other Ambulatory Visit (INDEPENDENT_AMBULATORY_CARE_PROVIDER_SITE_OTHER): Payer: Medicare Other

## 2015-10-02 VITALS — BP 124/76 | HR 67 | Temp 98.6°F | Resp 20 | Wt 160.0 lb

## 2015-10-02 DIAGNOSIS — E782 Mixed hyperlipidemia: Secondary | ICD-10-CM

## 2015-10-02 DIAGNOSIS — L209 Atopic dermatitis, unspecified: Secondary | ICD-10-CM

## 2015-10-02 DIAGNOSIS — M25412 Effusion, left shoulder: Secondary | ICD-10-CM

## 2015-10-02 DIAGNOSIS — Z1159 Encounter for screening for other viral diseases: Secondary | ICD-10-CM

## 2015-10-02 DIAGNOSIS — I10 Essential (primary) hypertension: Secondary | ICD-10-CM | POA: Diagnosis not present

## 2015-10-02 DIAGNOSIS — M542 Cervicalgia: Secondary | ICD-10-CM | POA: Diagnosis not present

## 2015-10-02 DIAGNOSIS — M25512 Pain in left shoulder: Secondary | ICD-10-CM

## 2015-10-02 DIAGNOSIS — R739 Hyperglycemia, unspecified: Secondary | ICD-10-CM

## 2015-10-02 LAB — HEMOGLOBIN A1C: HEMOGLOBIN A1C: 6.5 % (ref 4.6–6.5)

## 2015-10-02 LAB — BASIC METABOLIC PANEL
BUN: 12 mg/dL (ref 6–23)
CO2: 32 mEq/L (ref 19–32)
CREATININE: 0.83 mg/dL (ref 0.40–1.20)
Calcium: 9.6 mg/dL (ref 8.4–10.5)
Chloride: 104 mEq/L (ref 96–112)
GFR: 88.65 mL/min (ref >60.00)
Glucose, Bld: 86 mg/dL (ref 70–99)
Potassium: 4 mEq/L (ref 3.5–5.1)
Sodium: 142 mEq/L (ref 135–145)

## 2015-10-02 LAB — CBC WITH DIFFERENTIAL/PLATELET
BASOS PCT: 0.3 % (ref 0.0–3.0)
Basophils Absolute: 0 10*3/uL (ref 0.0–0.1)
EOS PCT: 1.4 % (ref 0.0–5.0)
Eosinophils Absolute: 0.1 10*3/uL (ref 0.0–0.7)
HEMATOCRIT: 38.8 % (ref 36.0–46.0)
HEMOGLOBIN: 12.4 g/dL (ref 12.0–15.0)
LYMPHS PCT: 37.8 % (ref 12.0–46.0)
Lymphs Abs: 2.5 10*3/uL (ref 0.7–4.0)
MCHC: 31.9 g/dL (ref 30.0–36.0)
MONOS PCT: 5.5 % (ref 3.0–12.0)
Monocytes Absolute: 0.4 10*3/uL (ref 0.1–1.0)
Neutro Abs: 3.7 10*3/uL (ref 1.4–7.7)
Neutrophils Relative %: 55 % (ref 43.0–77.0)
Platelets: 211 10*3/uL (ref 150.0–400.0)
RBC: 5.76 Mil/uL — ABNORMAL HIGH (ref 3.87–5.11)
RDW: 17 % — AB (ref 11.5–15.5)
WBC: 6.7 10*3/uL (ref 4.0–10.5)

## 2015-10-02 LAB — URINALYSIS, ROUTINE W REFLEX MICROSCOPIC
Bilirubin Urine: NEGATIVE
HGB URINE DIPSTICK: NEGATIVE
Ketones, ur: NEGATIVE
Leukocytes, UA: NEGATIVE
NITRITE: NEGATIVE
Specific Gravity, Urine: 1.015 (ref 1.000–1.030)
Total Protein, Urine: NEGATIVE
URINE GLUCOSE: NEGATIVE
Urobilinogen, UA: 0.2 (ref 0.0–1.0)
pH: 6 (ref 5.0–8.0)

## 2015-10-02 LAB — LIPID PANEL
CHOL/HDL RATIO: 3
Cholesterol: 167 mg/dL (ref 0–200)
HDL: 49.4 mg/dL (ref >39.00)
LDL CALC: 83 mg/dL (ref 0–99)
NONHDL: 117.78
TRIGLYCERIDES: 173 mg/dL — AB (ref 0.0–149.0)
VLDL: 34.6 mg/dL (ref 0.0–40.0)

## 2015-10-02 LAB — HEPATIC FUNCTION PANEL
ALT: 11 U/L (ref 0–35)
AST: 15 U/L (ref 0–37)
Albumin: 4.1 g/dL (ref 3.5–5.2)
Alkaline Phosphatase: 82 U/L (ref 39–117)
BILIRUBIN DIRECT: 0.1 mg/dL (ref 0.0–0.3)
BILIRUBIN TOTAL: 0.6 mg/dL (ref 0.2–1.2)
Total Protein: 7.8 g/dL (ref 6.0–8.3)

## 2015-10-02 LAB — TSH: TSH: 1.1 u[IU]/mL (ref 0.35–4.50)

## 2015-10-02 MED ORDER — ASPIRIN EC 81 MG PO TBEC: 81.0000 mg | DELAYED_RELEASE_TABLET | Freq: Every day | ORAL | Status: DC

## 2015-10-02 MED ORDER — ATORVASTATIN CALCIUM 10 MG PO TABS: 10.0000 mg | ORAL_TABLET | Freq: Every day | ORAL | Status: DC

## 2015-10-02 MED ORDER — TRIAMCINOLONE ACETONIDE 0.1 % EX CREA: 1.0000 "application " | TOPICAL_CREAM | Freq: Two times a day (BID) | CUTANEOUS | Status: DC

## 2015-10-02 MED ORDER — HYDROCHLOROTHIAZIDE 12.5 MG PO TABS: 12.5000 mg | ORAL_TABLET | Freq: Every day | ORAL | Status: DC

## 2015-10-02 NOTE — Progress Notes (Signed)
Pre visit review using our clinic review tool, if applicable. No additional management support is needed unless otherwise documented below in the visit note. 

## 2015-10-02 NOTE — Patient Instructions (Addendum)
You had the Prevnar 13 pneumonia shot today  Please take all new medication as prescribed  - the steroid cream  Please continue all other medications as before, and refills have been done if requested.  Please have the pharmacy call with any other refills you may need.  Please continue your efforts at being more active, low cholesterol diet, and weight control.  You are otherwise up to date with prevention measures today.  Please keep your appointments with your specialists as you may have planned  Please make appt with Dr Smith/sports medicine for the left neck and shoulder  Please go to the LAB in the Basement (turn left off the elevator) for the tests to be done today  You will be contacted by phone if any changes need to be made immediately.  Otherwise, you will receive a letter about your results with an explanation, but please check with MyChart first.  Please remember to sign up for MyChart if you have not done so, as this will be important to you in the future with finding out test results, communicating by private email, and scheduling acute appointments online when needed.  Please return in 1 year for your yearly visit, or sooner if needed

## 2015-10-02 NOTE — Progress Notes (Signed)
Subjective:    Patient ID: Victoria Hale, female    DOB: 08/05/1950, 65 y.o.   MRN: [id number]  HPI  Here for yearly f/u;  Overall doing ok;  Pt denies Chest pain, worsening SOB, DOE, wheezing, orthopnea, PND, worsening LE edema, palpitations, dizziness or syncope.  Pt denies neurological change such as new headache, facial or extremity weakness.  Pt denies polydipsia, polyuria, or low sugar symptoms. Pt states overall good compliance with treatment and medications, good tolerability, and has been trying to follow appropriate diet.  Pt denies worsening depressive symptoms, suicidal ideation or panic. No fever, night sweats, wt loss, loss of appetite, or other constitutional symptoms.  Pt states good ability with ADL's, has low fall risk, home safety reviewed and adequate, no other significant changes in hearing or vision, and only occasionally active with exercise. Does c/o left neck and left shoulder mild to occas severe sharp and sometimes burning pain, and left shoulder itself seems to be swollen, with some tenderness felt, tries not to lie on left side.  Also has a rash to left elbow, nontender, slightly scaly for several weeks.   Past Medical History  Diagnosis Date  . LACTOSE INTOLERANCE 03/02/2007  . Overweight(278.02) 03/02/2007  . THALASSEMIA NEC 03/02/2007  . OTITIS EXTERNA, CHRONIC NEC 03/02/2007  . HYPERTENSION, BENIGN 11/28/2005  . ALLERGIC RHINITIS 03/02/2007  . GERD 03/02/2007  . OVERACTIVE BLADDER 09/03/2009  . DERMATITIS, SEBORRHEIC 12/04/2007  . DERMATITIS, ATOPIC 03/02/2007  . PHOTOALLERGIC DERMATITIS 03/02/2007  . PSORIASIS 08/28/2008  . ALOPECIA TOTALIS 03/02/2007  . Hidradenitis 03/05/2010  . DEGENERATION, LUMBAR/LUMBOSACRAL DISC 03/02/2007  . ANOMALY, CONGENITAL, KIDNEY NEC 03/02/2007  . PALPITATIONS, RECURRENT 03/02/2007  . DYSPNEA 03/01/2007  . Cough 10/09/2008  . ANA POSITIVE 09/03/2009  . COLONIC POLYPS, HX OF 03/02/2007  . NEPHROLITHIASIS, HX OF 03/02/2007  . Unspecified anemia  10/09/2008   Past Surgical History  Procedure Laterality Date  . Exploratory abdominal surgury  1970's  . Appendectomy      ?  Marland Kitchen S/p btl    . S/p c-section twice    . Cesarean section      reports that she quit smoking about 29 years ago. She has never used smokeless tobacco. She reports that she drinks about 0.6 oz of alcohol per week. She reports that she does not use illicit drugs. family history includes Breast cancer (age of onset: 52) in her mother; Heart attack (age of onset: 49) in her father; Heart disease (age of onset: 73) in her sister. No Known Allergies No current outpatient prescriptions on file prior to visit.   Current Facility-Administered Medications on File Prior to Visit  Medication Dose Route Frequency Provider Last Rate Last Dose  . 0.9 %  sodium chloride infusion  500 mL Intravenous Continuous Louis Meckel, MD       Review of Systems Constitutional: Negative for increased diaphoresis, or other activity, appetite or siginficant weight change other than noted HENT: Negative for worsening hearing loss, ear pain, facial swelling, mouth sores and neck stiffness.   Eyes: Negative for other worsening pain, redness or visual disturbance.  Respiratory: Negative for choking or stridor Cardiovascular: Negative for other chest pain and palpitations.  Gastrointestinal: Negative for worsening diarrhea, blood in stool, or abdominal distention Genitourinary: Negative for hematuria, flank pain or change in urine volume.  Musculoskeletal: Negative for myalgias or other joint complaints.  Skin: Negative for other color change and wound or drainage.  Neurological: Negative for syncope and numbness. other  than noted Hematological: Negative for adenopathy. or other swelling Psychiatric/Behavioral: Negative for hallucinations, SI, self-injury, decreased concentration or other worsening agitation.      Objective:   Physical Exam BP 124/76 mmHg  Pulse 67  Temp(Src) 98.6 F (37  C) (Oral)  Resp 20  Wt 160 lb (72.576 kg)  SpO2 96% VS noted,  Constitutional: Pt is oriented to person, place, and time. Appears well-developed and well-nourished, in no significant distress Head: Normocephalic and atraumatic  Eyes: Conjunctivae and EOM are normal. Pupils are equal, round, and reactive to light Right Ear: External ear normal.  Left Ear: External ear normal Nose: Nose normal.  Mouth/Throat: Oropharynx is clear and moist  Neck: Normal range of motion. Neck supple. No JVD present. No tracheal deviation present or significant neck LA or mass, FROM, nontender Cardiovascular: Normal rate, regular rhythm, normal heart sounds and intact distal pulses.   Pulmonary/Chest: Effort normal and breath sounds without rales or wheezing  Abdominal: Soft. Bowel sounds are normal. NT. No HSM  Musculoskeletal: Normal range of motion. Exhibits no edema Lymphadenopathy: Has no cervical adenopathy.  Neurological: Pt is alert and oriented to person, place, and time. Pt has normal reflexes. No cranial nerve deficit. Motor grossly intact Skin: Skin is warm and dry. + slight scaly nontender nonerythem rash to left elbow, no new ulcers Psychiatric:  Has normal mood and affect. Behavior is normal.  Left shoulder with swelling/tender left AC joint area    Assessment & Plan:

## 2015-10-03 LAB — HEPATITIS C ANTIBODY: HCV AB: NEGATIVE

## 2015-10-03 NOTE — Assessment & Plan Note (Signed)
stable overall by history and exam, recent data reviewed with pt, and pt to continue medical treatment as before,  to f/u any worsening symptoms or concerns BP Readings from Last 3 Encounters:  10/02/15 124/76  08/20/14 122/78  06/26/14 140/84

## 2015-10-03 NOTE — Assessment & Plan Note (Signed)
?   Underlying djd or ddd with possible radiculitis like symptoms, declines med tx for now, for referral to sport medicine,  to f/u any worsening symptoms or concerns

## 2015-10-03 NOTE — Assessment & Plan Note (Signed)
With possible left AC joint involvement, also for sport med referral,  to f/u any worsening symptoms or concerns

## 2015-10-03 NOTE — Assessment & Plan Note (Signed)
For triam cr prn,  to f/u any worsening symptoms or concerns 

## 2015-10-03 NOTE — Assessment & Plan Note (Signed)
stable overall by history and exam, recent data reviewed with pt, and pt to continue medical treatment as before,  to f/u any worsening symptoms or concerns Lab Results  Component Value Date   LDLCALC 83 10/02/2015   For f/u labs

## 2015-10-16 ENCOUNTER — Encounter: Payer: Self-pay | Admitting: Family Medicine

## 2015-10-16 ENCOUNTER — Ambulatory Visit (INDEPENDENT_AMBULATORY_CARE_PROVIDER_SITE_OTHER): Payer: Medicare Other | Admitting: Family Medicine

## 2015-10-16 ENCOUNTER — Ambulatory Visit (INDEPENDENT_AMBULATORY_CARE_PROVIDER_SITE_OTHER)
Admission: RE | Admit: 2015-10-16 | Discharge: 2015-10-16 | Disposition: A | Discharge status: Home / Self Care | Payer: Medicare Other | Source: Ambulatory Visit | Attending: Family Medicine | Admitting: Family Medicine

## 2015-10-16 VITALS — BP 122/80 | HR 64 | Ht 60.75 in | Wt 160.0 lb

## 2015-10-16 DIAGNOSIS — M5030 Other cervical disc degeneration, unspecified cervical region: Secondary | ICD-10-CM | POA: Diagnosis not present

## 2015-10-16 DIAGNOSIS — M25512 Pain in left shoulder: Secondary | ICD-10-CM | POA: Diagnosis not present

## 2015-10-16 DIAGNOSIS — M542 Cervicalgia: Secondary | ICD-10-CM

## 2015-10-16 MED ORDER — GABAPENTIN 100 MG PO CAPS: 100.0000 mg | ORAL_CAPSULE | Freq: Every day | ORAL | Status: DC

## 2015-10-16 NOTE — Assessment & Plan Note (Signed)
I believe the patient does have degenerative cervical disc. We discussed with patient at great length. I do feel that this is likely coming from her neck and the swelling that she notes over her left shoulder from time to time it is more from muscle spasm. Patient was started on gabapentin, we discussed icing regimen, home exercises given. X-rays are pending to further evaluate patient's neck. Patient also have left shoulder x-rays at this time. I do believe the patient will do very well. Patient will come back and see me again in 3 weeks for further evaluation and treatment.
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
## 2015-10-16 NOTE — Patient Instructions (Addendum)
Good to see you.  Xray downstairs today I will send results in my chart Ice 20 minutes 2 times daily. Usually after activity and before bed. Gabapentin 100mg  at nigh t Vitamin D 2000 IU daily  Turmeric 500mg  2 times daily  Tart cherry extract any dose at night  Exercises 3 times a week.  See me again in 3 weeks

## 2015-10-16 NOTE — Progress Notes (Signed)
Pre visit review using our clinic review tool, if applicable. No additional management support is needed unless otherwise documented below in the visit note. 

## 2015-11-06 ENCOUNTER — Ambulatory Visit: Payer: Medicare Other | Admitting: Family Medicine

## 2015-11-06 DIAGNOSIS — Z0289 Encounter for other administrative examinations: Secondary | ICD-10-CM

## 2016-04-04 ENCOUNTER — Other Ambulatory Visit: Payer: Self-pay | Admitting: Internal Medicine

## 2016-04-04 DIAGNOSIS — Z1231 Encounter for screening mammogram for malignant neoplasm of breast: Secondary | ICD-10-CM

## 2016-04-08 ENCOUNTER — Encounter: Payer: Self-pay | Admitting: Internal Medicine

## 2016-04-08 ENCOUNTER — Ambulatory Visit (INDEPENDENT_AMBULATORY_CARE_PROVIDER_SITE_OTHER): Payer: Medicare Other | Admitting: Internal Medicine

## 2016-04-08 VITALS — BP 128/80 | HR 70 | Resp 20 | Wt 157.0 lb

## 2016-04-08 DIAGNOSIS — R21 Rash and other nonspecific skin eruption: Secondary | ICD-10-CM | POA: Diagnosis not present

## 2016-04-08 DIAGNOSIS — Z01419 Encounter for gynecological examination (general) (routine) without abnormal findings: Secondary | ICD-10-CM

## 2016-04-08 DIAGNOSIS — I10 Essential (primary) hypertension: Secondary | ICD-10-CM

## 2016-04-08 DIAGNOSIS — Z0000 Encounter for general adult medical examination without abnormal findings: Secondary | ICD-10-CM | POA: Diagnosis not present

## 2016-04-08 DIAGNOSIS — R739 Hyperglycemia, unspecified: Secondary | ICD-10-CM | POA: Insufficient documentation

## 2016-04-08 DIAGNOSIS — E079 Disorder of thyroid, unspecified: Secondary | ICD-10-CM | POA: Diagnosis not present

## 2016-04-08 DIAGNOSIS — R350 Frequency of micturition: Secondary | ICD-10-CM | POA: Insufficient documentation

## 2016-04-08 MED ORDER — AMLODIPINE BESYLATE 2.5 MG PO TABS: 2.5000 mg | ORAL_TABLET | Freq: Every day | ORAL | 3 refills | Status: DC

## 2016-04-08 NOTE — Assessment & Plan Note (Addendum)
Rash c/w prob psoriasis but apparently not well controlled in past with steroid cream, for derm referral for now as next step to consider restart MTX,  to f/u any worsening symptoms or concerns

## 2016-04-08 NOTE — Progress Notes (Signed)
Subjective:    Patient ID: Victoria Hale, female    DOB: 1950-11-07, 65 y.o.   MRN: [id number]  HPI    Here with acute onset mild to mod 2-3 days ST, HA, general weakness and malaise, with prod cough clearwish sputum, feeling warm, but Pt denies chest pain, increased sob or doe, wheezing, orthopnea, PND, increased LE swelling, palpitations, dizziness or syncope.  Asks forcefully to see Dr Glenna FellowsEllison/endo for thyroid exam as her daughter had a rash that cleared after saw endo for thyroid issue, rash to elbows extensor surface, left wrist and right groin area with ? Plaque like rash, has not seen derm, has tried several steroid creams but not helped significantly..  Only thing that helped in the past was methotrexate.  Also c/o > 6 mo urinary freq, last UA neg, no fever or pain or hematuria, ? OAB, asks for urology referral. Asks to change diuretic for BP as tends to make it worse.  Denies urinary symptoms such as dysuria, urgency, flank pain, hematuria or n/v, fever, chills. BP Readings from Last 3 Encounters:  04/08/16 128/80  10/16/15 122/80  10/02/15 124/76  Recently divorced, now with name change Denies worsening depressive symptoms, suicidal ideation, or panic  Also reqeusts referral for routine GYN care Past Medical History:  Diagnosis Date  . ALLERGIC RHINITIS 03/02/2007  . ALOPECIA TOTALIS 03/02/2007  . ANA POSITIVE 09/03/2009  . ANOMALY, CONGENITAL, KIDNEY NEC 03/02/2007  . COLONIC POLYPS, HX OF 03/02/2007  . Cough 10/09/2008  . DEGENERATION, LUMBAR/LUMBOSACRAL DISC 03/02/2007  . DERMATITIS, ATOPIC 03/02/2007  . DERMATITIS, SEBORRHEIC 12/04/2007  . DYSPNEA 03/01/2007  . GERD 03/02/2007  . Hidradenitis 03/05/2010  . HYPERTENSION, BENIGN 11/28/2005  . LACTOSE INTOLERANCE 03/02/2007  . NEPHROLITHIASIS, HX OF 03/02/2007  . OTITIS EXTERNA, CHRONIC NEC 03/02/2007  . OVERACTIVE BLADDER 09/03/2009  . Overweight(278.02) 03/02/2007  . PALPITATIONS, RECURRENT 03/02/2007  . PHOTOALLERGIC DERMATITIS  03/02/2007  . PSORIASIS 08/28/2008  . THALASSEMIA NEC 03/02/2007  . Unspecified anemia 10/09/2008   Past Surgical History:  Procedure Laterality Date  . APPENDECTOMY     ?  . CESAREAN SECTION    . exploratory abdominal surgury  1970's  . s/p BTL    . s/p C-section twice      reports that she quit smoking about 29 years ago. She has never used smokeless tobacco. She reports that she drinks about 0.6 oz of alcohol per week . She reports that she does not use drugs. family history includes Breast cancer (age of onset: 6260) in her mother; Heart attack (age of onset: 1180) in her father; Heart disease (age of onset: 6948) in her sister. No Known Allergies Current Outpatient Prescriptions on File Prior to Visit  Medication Sig Dispense Refill  . aspirin EC 81 MG tablet Take 1 tablet (81 mg total) by mouth daily. 90 tablet 11  . atorvastatin (LIPITOR) 10 MG tablet Take 1 tablet (10 mg total) by mouth daily. 90 tablet 3  . gabapentin (NEURONTIN) 100 MG capsule Take 1-2 capsules (100-200 mg total) by mouth at bedtime. 60 capsule 3  . triamcinolone cream (KENALOG) 0.1 % Apply 1 application topically 2 (two) times daily. 30 g 2   No current facility-administered medications on file prior to visit.    Review of Systems  Constitutional: Negative for unusual diaphoresis or night sweats HENT: Negative for ear swelling or discharge Eyes: Negative for worsening visual haziness  Respiratory: Negative for choking and stridor.   Gastrointestinal: Negative for distension or  worsening eructation Genitourinary: Negative for retention or change in urine volume.  Musculoskeletal: Negative for other MSK pain or swelling Skin: Negative for color change and worsening wound Neurological: Negative for tremors and numbness other than noted  Psychiatric/Behavioral: Negative for decreased concentration or agitation other than above   All other system neg    Objective:   Physical Exam BP 128/80   Pulse 70   Resp 20    Wt 157 lb (71.2 kg)   SpO2 96%   BMI 29.91 kg/m  VS noted, not ill appaering Constitutional: Pt appears in no apparent distress HENT: Head: NCAT.  Right Ear: External ear normal.  Left Ear: External ear normal.  Eyes: . Pupils are equal, round, and reactive to light. Conjunctivae and EOM are normal Neck: Normal range of motion. Neck supple. no thryoidomegaly or tender Cardiovascular: Normal rate and regular rhythm.   Pulmonary/Chest: Effort normal and breath sounds without rales or wheezing.  Abd:  Soft, NT, ND, + BS Neurological: Pt is alert. Not confused , motor grossly intact Skin: Skin is warm.  no LE edema, has plaque like rash to bilat elbow extensor surfaces, NT approx 3 cm areas bilat Psychiatric: Pt behavior is normal. No agitation.      Assessment & Plan:

## 2016-04-08 NOTE — Assessment & Plan Note (Signed)
Lab Results  Component Value Date   TSH 1.10 10/02/2015   Pt adamant for f/u thryoid with endo due to daughter illness related to thyroid improved, details unclear

## 2016-04-08 NOTE — Assessment & Plan Note (Addendum)
stable overall by history and exam, recent data reviewed with pt, and pt to change hct to amlod 2.5 qd to avoid diuretic effect,  to f/u any worsening symptoms or concerns BP Readings from Last 3 Encounters:  04/08/16 128/80  10/16/15 122/80  10/02/15 124/76

## 2016-04-08 NOTE — Patient Instructions (Addendum)
You had the flu shot today  You will be contacted regarding the referral for: Dr Glenna FellowsEllison/endo, Dermatology (Dr Naida Sleight Mcconnell), GYN at [facility], and Urology  OK to stop the fluid pill  Please take all new medication as prescribed  - the low dose amlodipine (whiich is not a fliuid pill) - 1 per day  OK to take all medication in the AM  Please continue all other medications as before, and refills have been done if requested.  Please have the pharmacy call with any other refills you may need.  Please continue your efforts at being more active, low cholesterol diet, and weight control.  Please keep your appointments with your specialists as you may have planned  Please return in 6 months, or sooner if needed

## 2016-04-08 NOTE — Progress Notes (Signed)
Pre visit review using our clinic review tool, if applicable. No additional management support is needed unless otherwise documented below in the visit note. 

## 2016-04-08 NOTE — Assessment & Plan Note (Signed)
Afeb, exam benign, more recent UA neg for acute, suspect underlying OAB like disorder, declines detrol trial, to f/u any worsening symptoms or concerns, also refer urology per pt request

## 2016-04-13 ENCOUNTER — Ambulatory Visit
Admission: RE | Admit: 2016-04-13 | Discharge: 2016-04-13 | Disposition: A | Discharge status: Home / Self Care | Payer: Medicare Other | Source: Ambulatory Visit | Attending: Internal Medicine | Admitting: Internal Medicine

## 2016-04-13 DIAGNOSIS — Z1231 Encounter for screening mammogram for malignant neoplasm of breast: Secondary | ICD-10-CM

## 2016-04-15 ENCOUNTER — Telehealth: Payer: Self-pay | Admitting: Internal Medicine

## 2016-04-15 NOTE — Telephone Encounter (Addendum)
Patient states she has some questions on last OV.  Also states that its written she had a flu shot but she never got one.  Also thinks that a medication she can't remember the name of got sent to the incorrect pharmacy - states it is a fluid pill.    Please call patient back in regard.

## 2016-04-19 ENCOUNTER — Ambulatory Visit (INDEPENDENT_AMBULATORY_CARE_PROVIDER_SITE_OTHER): Payer: Medicare Other | Admitting: General Practice

## 2016-04-19 ENCOUNTER — Telehealth: Payer: Self-pay | Admitting: Endocrinology

## 2016-04-19 DIAGNOSIS — Z23 Encounter for immunization: Secondary | ICD-10-CM

## 2016-04-27 DIAGNOSIS — L400 Psoriasis vulgaris: Secondary | ICD-10-CM | POA: Diagnosis not present

## 2016-05-08 NOTE — Progress Notes (Signed)
Subjective:    Patient ID: Victoria HarbourMildred G Hale, female    DOB: January 05, 1951, 65 y.o.   MRN: [id number]  HPI Pt states few months of slight intermittent pain at the anterior neck, and slight assoc swelling.  she is unaware of ever having had thyroid problems in the past.  He has no h/o XRT or surgery to the neck.    Past Medical History:  Diagnosis Date  . ALLERGIC RHINITIS 03/02/2007  . ALOPECIA TOTALIS 03/02/2007  . ANA POSITIVE 09/03/2009  . ANOMALY, CONGENITAL, KIDNEY NEC 03/02/2007  . COLONIC POLYPS, HX OF 03/02/2007  . Cough 10/09/2008  . DEGENERATION, LUMBAR/LUMBOSACRAL DISC 03/02/2007  . DERMATITIS, ATOPIC 03/02/2007  . DERMATITIS, SEBORRHEIC 12/04/2007  . DYSPNEA 03/01/2007  . GERD 03/02/2007  . Hidradenitis 03/05/2010  . HYPERTENSION, BENIGN 11/28/2005  . LACTOSE INTOLERANCE 03/02/2007  . NEPHROLITHIASIS, HX OF 03/02/2007  . OTITIS EXTERNA, CHRONIC NEC 03/02/2007  . OVERACTIVE BLADDER 09/03/2009  . Overweight(278.02) 03/02/2007  . PALPITATIONS, RECURRENT 03/02/2007  . PHOTOALLERGIC DERMATITIS 03/02/2007  . PSORIASIS 08/28/2008  . THALASSEMIA NEC 03/02/2007  . Unspecified anemia 10/09/2008    Past Surgical History:  Procedure Laterality Date  . APPENDECTOMY     ?  . CESAREAN SECTION    . exploratory abdominal surgury  1970's  . s/p BTL    . s/p C-section twice      Social History   Social History  . Marital status: Married    Spouse name: N/A  . Number of children: 2  . Years of education: N/A   Occupational History  . clerical job for [employer]    Social History Main Topics  . Smoking status: Former Smoker    Quit date: 07/27/1986  . Smokeless tobacco: Never Used  . Alcohol use 0.6 oz/week    1 Glasses of wine per week     Comment: glass of wine weekend  . Drug use: No  . Sexual activity: No     Comment: 1st intercourse- 18, partners- less than 5   Other Topics Concern  . Not on file   Social History Narrative  . No narrative on file    Current Outpatient  Prescriptions on File Prior to Visit  Medication Sig Dispense Refill  . aspirin EC 81 MG tablet Take 1 tablet (81 mg total) by mouth daily. 90 tablet 11  . atorvastatin (LIPITOR) 10 MG tablet Take 1 tablet (10 mg total) by mouth daily. 90 tablet 3  . amLODipine (NORVASC) 2.5 MG tablet Take 1 tablet (2.5 mg total) by mouth daily. (Patient not taking: Reported on 05/10/2016) 90 tablet 3  . gabapentin (NEURONTIN) 100 MG capsule Take 1-2 capsules (100-200 mg total) by mouth at bedtime. (Patient not taking: Reported on 05/10/2016) 60 capsule 3  . triamcinolone cream (KENALOG) 0.1 % Apply 1 application topically 2 (two) times daily. (Patient not taking: Reported on 05/10/2016) 30 g 2   No current facility-administered medications on file prior to visit.     No Known Allergies  Family History  Problem Relation Age of Onset  . Breast cancer Mother 1960    diagnosed  . Heart attack Father 8780  . Heart disease Sister 1448    had heart valve replaced    BP 134/78   Pulse 61   Ht 5' 0.75" (1.543 m)   Wt 157 lb (71.2 kg)   SpO2 97%   BMI 29.91 kg/m    Review of Systems Denies weight change, hoarseness, visual loss, chest  pain, sob, cough, dysphagia, diarrhea, flushing, easy bruising, depression, headache, numbness, and rhinorrhea.  She has dry skin, cold intolerance, and itching.      Objective:   Physical Exam VS: see vs page GEN: no distress HEAD: head: no deformity eyes: no periorbital swelling, no proptosis external nose and ears are normal mouth: no lesion seen NECK: supple, thyroid is not enlarged CHEST WALL: no deformity LUNGS: clear to auscultation CV: reg rate and rhythm, no murmur ABD: abdomen is soft, nontender.  no hepatosplenomegaly.  not distended.  no hernia MUSCULOSKELETAL: muscle bulk and strength are grossly normal.  no obvious joint swelling.  gait is normal and steady EXTEMITIES: no deformity.  no edema PULSES: no carotid bruit NEURO:  cn 2-12 grossly intact.    readily moves all 4's.  sensation is intact to touch on all 4's SKIN:  Normal texture and temperature.  No rash or suspicious lesion is visible.  On the extensor surfaces of the elbows, there are plaques of dry, scaly skin.   NODES:  None palpable at the neck PSYCH: alert, well-oriented.  Does not appear anxious nor depressed.    CT C-spine (2012): no mention is made of the thyroid.   Lab Results  Component Value Date   TSH 1.10 10/02/2015   I have reviewed outside records, and summarized: Dr Jonny RuizJohn had low index of suspicion that pt had thyroid problem, but pt said she thought rash might be thyroid problem      Assessment & Plan:  Neck pain, new, uncertain etiology.  Euthyroid.   Psoriasis: this increases the risk of autoimmune dz, but does not directly affect the thyroid.    Patient is advised the following: Patient Instructions  blood tests are requested for you today.  We'll let you know about the results. Let's check an ultrasound.  you will receive a phone call, about a day and time for an appointment.

## 2016-05-10 ENCOUNTER — Ambulatory Visit (INDEPENDENT_AMBULATORY_CARE_PROVIDER_SITE_OTHER): Payer: Medicare Other | Admitting: Endocrinology

## 2016-05-10 ENCOUNTER — Encounter: Payer: Self-pay | Admitting: Endocrinology

## 2016-05-10 VITALS — BP 134/78 | HR 61 | Ht 60.75 in | Wt 157.0 lb

## 2016-05-10 DIAGNOSIS — M542 Cervicalgia: Secondary | ICD-10-CM | POA: Diagnosis not present

## 2016-05-10 DIAGNOSIS — E079 Disorder of thyroid, unspecified: Secondary | ICD-10-CM

## 2016-05-10 DIAGNOSIS — R002 Palpitations: Secondary | ICD-10-CM

## 2016-05-10 DIAGNOSIS — K219 Gastro-esophageal reflux disease without esophagitis: Secondary | ICD-10-CM

## 2016-05-10 DIAGNOSIS — R768 Other specified abnormal immunological findings in serum: Secondary | ICD-10-CM

## 2016-05-10 NOTE — Patient Instructions (Signed)
blood tests are requested for you today.  We'll let you know about the results. Let's check an ultrasound.  you will receive a phone call, about a day and time for an appointment 

## 2016-05-11 LAB — THYROID PEROXIDASE ANTIBODY: Thyroperoxidase Ab SerPl-aCnc: 16 IU/mL — ABNORMAL HIGH (ref <9)

## 2016-05-16 ENCOUNTER — Ambulatory Visit
Admission: RE | Admit: 2016-05-16 | Discharge: 2016-05-16 | Disposition: A | Discharge status: Home / Self Care | Payer: Medicare Other | Source: Ambulatory Visit | Attending: Endocrinology | Admitting: Endocrinology

## 2016-05-16 DIAGNOSIS — M542 Cervicalgia: Secondary | ICD-10-CM | POA: Diagnosis not present

## 2016-06-14 ENCOUNTER — Telehealth: Payer: Self-pay | Admitting: *Deleted

## 2016-06-14 NOTE — Telephone Encounter (Signed)
Pt left msg on triage yesterday @ 10:30 am requesting a call back from nurse concerning her medications. Called pt back no answer LMOM RTC,.../lmb

## 2016-10-04 ENCOUNTER — Ambulatory Visit: Payer: Medicare Other | Admitting: Internal Medicine

## 2016-10-12 ENCOUNTER — Encounter: Payer: Self-pay | Admitting: Gynecology

## 2016-10-26 ENCOUNTER — Ambulatory Visit (INDEPENDENT_AMBULATORY_CARE_PROVIDER_SITE_OTHER): Payer: Medicare Other | Admitting: Internal Medicine

## 2016-10-26 ENCOUNTER — Encounter: Payer: Self-pay | Admitting: Internal Medicine

## 2016-10-26 ENCOUNTER — Other Ambulatory Visit (INDEPENDENT_AMBULATORY_CARE_PROVIDER_SITE_OTHER): Payer: Medicare Other

## 2016-10-26 ENCOUNTER — Other Ambulatory Visit: Payer: Self-pay | Admitting: Internal Medicine

## 2016-10-26 VITALS — BP 122/66 | HR 69 | Ht 61.0 in | Wt 154.0 lb

## 2016-10-26 DIAGNOSIS — I10 Essential (primary) hypertension: Secondary | ICD-10-CM

## 2016-10-26 DIAGNOSIS — Z0001 Encounter for general adult medical examination with abnormal findings: Secondary | ICD-10-CM | POA: Diagnosis not present

## 2016-10-26 DIAGNOSIS — H9202 Otalgia, left ear: Secondary | ICD-10-CM | POA: Diagnosis not present

## 2016-10-26 LAB — HEPATIC FUNCTION PANEL
ALBUMIN: 4.1 g/dL (ref 3.5–5.2)
ALK PHOS: 94 U/L (ref 39–117)
ALT: 9 U/L (ref 0–35)
AST: 14 U/L (ref 0–37)
BILIRUBIN DIRECT: 0.1 mg/dL (ref 0.0–0.3)
TOTAL PROTEIN: 7.5 g/dL (ref 6.0–8.3)
Total Bilirubin: 0.5 mg/dL (ref 0.2–1.2)

## 2016-10-26 LAB — URINALYSIS, ROUTINE W REFLEX MICROSCOPIC
BILIRUBIN URINE: NEGATIVE
Hgb urine dipstick: NEGATIVE
KETONES UR: NEGATIVE
LEUKOCYTES UA: NEGATIVE
Nitrite: NEGATIVE
PH: 5.5 (ref 5.0–8.0)
SPECIFIC GRAVITY, URINE: 1.02 (ref 1.000–1.030)
TOTAL PROTEIN, URINE-UPE24: NEGATIVE
UROBILINOGEN UA: 0.2 (ref 0.0–1.0)
Urine Glucose: NEGATIVE

## 2016-10-26 LAB — CBC WITH DIFFERENTIAL/PLATELET
BASOS ABS: 0 10*3/uL (ref 0.0–0.1)
Basophils Relative: 0.6 % (ref 0.0–3.0)
EOS ABS: 0.2 10*3/uL (ref 0.0–0.7)
Eosinophils Relative: 4.1 % (ref 0.0–5.0)
HEMATOCRIT: 39.8 % (ref 36.0–46.0)
HEMOGLOBIN: 12.7 g/dL (ref 12.0–15.0)
Lymphocytes Relative: 37.2 % (ref 12.0–46.0)
Lymphs Abs: 2.2 10*3/uL (ref 0.7–4.0)
MCHC: 31.8 g/dL (ref 30.0–36.0)
MCV: 68.6 fl — AB (ref 78.0–100.0)
MONOS PCT: 7.9 % (ref 3.0–12.0)
Monocytes Absolute: 0.5 10*3/uL (ref 0.1–1.0)
Neutro Abs: 3 10*3/uL (ref 1.4–7.7)
Neutrophils Relative %: 50.2 % (ref 43.0–77.0)
Platelets: 176 10*3/uL (ref 150.0–400.0)
RBC: 5.81 Mil/uL — AB (ref 3.87–5.11)
RDW: 15.4 % (ref 11.5–15.5)
WBC: 6 10*3/uL (ref 4.0–10.5)

## 2016-10-26 LAB — LIPID PANEL
CHOL/HDL RATIO: 3
Cholesterol: 143 mg/dL (ref 0–200)
HDL: 46.5 mg/dL (ref >39.00)
LDL Cholesterol: 69 mg/dL (ref 0–99)
NONHDL: 96.79
Triglycerides: 140 mg/dL (ref 0.0–149.0)
VLDL: 28 mg/dL (ref 0.0–40.0)

## 2016-10-26 LAB — BASIC METABOLIC PANEL
BUN: 12 mg/dL (ref 6–23)
CALCIUM: 9.7 mg/dL (ref 8.4–10.5)
CO2: 35 meq/L — AB (ref 19–32)
CREATININE: 0.83 mg/dL (ref 0.40–1.20)
Chloride: 103 mEq/L (ref 96–112)
GFR: 88.36 mL/min (ref >60.00)
Glucose, Bld: 97 mg/dL (ref 70–99)
Potassium: 4.5 mEq/L (ref 3.5–5.1)
SODIUM: 140 meq/L (ref 135–145)

## 2016-10-26 LAB — TSH: TSH: 1.13 u[IU]/mL (ref 0.35–4.50)

## 2016-10-26 MED ORDER — AZITHROMYCIN 250 MG PO TABS: ORAL_TABLET | ORAL | 1 refills | Status: DC

## 2016-10-26 MED ORDER — CETIRIZINE HCL 10 MG PO TABS: 10.0000 mg | ORAL_TABLET | Freq: Every day | ORAL | 11 refills | Status: DC

## 2016-10-26 NOTE — Assessment & Plan Note (Signed)

## 2016-10-26 NOTE — Progress Notes (Signed)
Subjective:    Patient ID: Victoria Hale, female    DOB: [birth year], 66 y.o.   MRN: [id number]  HPI  Here for wellness and f/u;  Overall doing ok;  Pt denies Chest pain, worsening SOB, DOE, wheezing, orthopnea, PND, worsening LE edema, palpitations, dizziness or syncope.  Pt denies neurological change such as new headache, facial or extremity weakness.  Pt denies polydipsia, polyuria, or low sugar symptoms. Pt states overall good compliance with treatment and medications, good tolerability, and has been trying to follow appropriate diet.  Pt denies worsening depressive symptoms, suicidal ideation or panic. No fever, night sweats, wt loss, loss of appetite, or other constitutional symptoms.  Pt states good ability with ADL's, has low fall risk, home safety reviewed and adequate, no other significant changes in hearing or vision, and only occasionally active with exercise.Declines immunizations today Also;  Does also have 3 wks mild heart beating she can hear and ringing sound.in left ear for 1 wk, with ? Low grade temp, but no ST, cough, or wheezing.  May have some slight reduced hearing on that side as well.  No ear d/c or vertigo  .Does have several wks ongoing nasal allergy symptoms with clearish congestion, itch and sneezing, without fever, pain, ST, cough, swelling or wheezing. Past Medical History:  Diagnosis Date  . ALLERGIC RHINITIS 03/02/2007  . ALOPECIA TOTALIS 03/02/2007  . ANA POSITIVE 09/03/2009  . ANOMALY, CONGENITAL, KIDNEY NEC 03/02/2007  . COLONIC POLYPS, HX OF 03/02/2007  . Cough 10/09/2008  . DEGENERATION, LUMBAR/LUMBOSACRAL DISC 03/02/2007  . DERMATITIS, ATOPIC 03/02/2007  . DERMATITIS, SEBORRHEIC 12/04/2007  . DYSPNEA 03/01/2007  . GERD 03/02/2007  . Hidradenitis 03/05/2010  . HYPERTENSION, BENIGN 11/28/2005  . LACTOSE INTOLERANCE 03/02/2007  . NEPHROLITHIASIS, HX OF 03/02/2007  . OTITIS EXTERNA, CHRONIC NEC 03/02/2007  . OVERACTIVE BLADDER 09/03/2009  . Overweight(278.02) 03/02/2007  .  PALPITATIONS, RECURRENT 03/02/2007  . PHOTOALLERGIC DERMATITIS 03/02/2007  . PSORIASIS 08/28/2008  . THALASSEMIA NEC 03/02/2007  . Unspecified anemia 10/09/2008   Past Surgical History:  Procedure Laterality Date  . APPENDECTOMY     ?  . CESAREAN SECTION    . exploratory abdominal surgury  1970's  . s/p BTL    . s/p C-section twice      reports that she quit smoking about 30 years ago. She has never used smokeless tobacco. She reports that she drinks about 0.6 oz of alcohol per week . She reports that she does not use drugs. family history includes Breast cancer (age of onset: 20) in her mother; Heart attack (age of onset: 38) in her father; Heart disease (age of onset: 48) in her sister. No Known Allergies Current Outpatient Prescriptions on File Prior to Visit  Medication Sig Dispense Refill  . amLODipine (NORVASC) 2.5 MG tablet Take 1 tablet (2.5 mg total) by mouth daily. 90 tablet 3  . aspirin EC 81 MG tablet Take 1 tablet (81 mg total) by mouth daily. 90 tablet 11  . atorvastatin (LIPITOR) 10 MG tablet Take 1 tablet (10 mg total) by mouth daily. 90 tablet 3  . gabapentin (NEURONTIN) 100 MG capsule Take 1-2 capsules (100-200 mg total) by mouth at bedtime. 60 capsule 3  . hydrochlorothiazide (MICROZIDE) 12.5 MG capsule Take 12.5 mg by mouth daily.    Marland Kitchen triamcinolone cream (KENALOG) 0.1 % Apply 1 application topically 2 (two) times daily. 30 g 2   No current facility-administered medications on file prior to visit.    Review of Systems  Constitutional: Negative for other unusual diaphoresis, sweats, appetite or weight changes HENT: Negative for other worsening hearing loss, ear pain, facial swelling, mouth sores or neck stiffness.   Eyes: Negative for other worsening pain, redness or other visual disturbance.  Respiratory: Negative for other stridor or swelling Cardiovascular: Negative for other palpitations or other chest pain  Gastrointestinal: Negative for worsening diarrhea or loose  stools, blood in stool, distention or other pain Genitourinary: Negative for hematuria, flank pain or other change in urine volume.  Musculoskeletal: Negative for myalgias or other joint swelling.  Skin: Negative for other color change, or other wound or worsening drainage.  Neurological: Negative for other syncope or numbness. Hematological: Negative for other adenopathy or swelling Psychiatric/Behavioral: Negative for hallucinations, other worsening agitation, SI, self-injury, or new decreased concentration All other system neg per pt    Objective:   Physical Exam BP 122/66   Pulse 69   Ht 5\' 1"  (1.549 m)   Wt 154 lb (69.9 kg)   SpO2 100%   BMI 29.10 kg/m  VS noted, non toxic Constitutional: Pt is oriented to person, place, and time. Appears well-developed and well-nourished, in no significant distress and comfortable Head: Normocephalic and atraumatic  Eyes: Conjunctivae and EOM are normal. Pupils are equal, round, and reactive to light Right Ear: External ear normal without discharge, right TM with trace erythema only Left Ear: External ear normal without discharge, Left TM with erythema and effusion, slight bulging Nose: Nose without discharge or deformity Mouth/Throat: Oropharynx is without other ulcerations and moist  Neck: Normal range of motion. Neck supple. No JVD present. No tracheal deviation present or significant neck LA or mass Cardiovascular: Normal rate, regular rhythm, normal heart sounds and intact distal pulses.   Pulmonary/Chest: WOB normal and breath sounds without rales or wheezing  Abdominal: Soft. Bowel sounds are normal. NT. No HSM  Musculoskeletal: Normal range of motion. Exhibits no edema Lymphadenopathy: Has no other cervical adenopathy.  Neurological: Pt is alert and oriented to person, place, and time. Pt has normal reflexes. No cranial nerve deficit. Motor grossly intact, Gait intact Skin: Skin is warm and dry. No rash noted or new  ulcerations Psychiatric:  Has normal mood and affect. Behavior is normal without agitation No other exam findings Lab Results  Component Value Date   WBC 6.7 10/02/2015   HGB 12.4 10/02/2015   HCT 38.8 10/02/2015   PLT 211.0 10/02/2015   GLUCOSE 86 10/02/2015   CHOL 167 10/02/2015   TRIG 173.0 (H) 10/02/2015   HDL 49.40 10/02/2015   LDLCALC 83 10/02/2015   ALT 11 10/02/2015   AST 15 10/02/2015   NA 142 10/02/2015   K 4.0 10/02/2015   CL 104 10/02/2015   CREATININE 0.83 10/02/2015   BUN 12 10/02/2015   CO2 32 10/02/2015   TSH 1.10 10/02/2015   HGBA1C 6.5 10/02/2015       Assessment & Plan:

## 2016-10-26 NOTE — Addendum Note (Signed)
Addended by: Roney MansGAY, Kayanna Mckillop on: 10/26/2016 04:23 PM   Modules accepted: Orders

## 2016-10-26 NOTE — Addendum Note (Signed)
Addended by: Roney MansGAY, Berneice Zettlemoyer on: 10/26/2016 04:30 PM   Modules accepted: Orders

## 2016-10-26 NOTE — Patient Instructions (Addendum)
Please take all new medication as prescribed - the antibiotic, and zyrtec as directed  You can also take Mucinex (or it's generic off brand) for congestion, and tylenol as needed for pain.  Please continue all other medications as before, and refills have been done if requested.  Please have the pharmacy call with any other refills you may need.  Please continue your efforts at being more active, low cholesterol diet, and weight control.  You are otherwise up to date with prevention measures today.  Please keep your appointments with your specialists as you may have planned  Please go to the LAB in the Basement (turn left off the elevator) for the tests to be done today  You will be contacted by phone if any changes need to be made immediately.  Otherwise, you will receive a letter about your results with an explanation, but please check with MyChart first.  Please remember to sign up for MyChart if you have not done so, as this will be important to you in the future with finding out test results, communicating by private email, and scheduling acute appointments online when needed.  Please return in 1 year for your yearly visit, or sooner if needed, with Lab testing done 3-5 days before

## 2016-10-26 NOTE — Assessment & Plan Note (Signed)
stable overall by history and exam, recent data reviewed with pt, and pt to continue medical treatment as before,  to f/u any worsening symptoms or concerns BP Readings from Last 3 Encounters:  10/26/16 122/66  05/10/16 134/78  04/08/16 128/80

## 2016-10-26 NOTE — Assessment & Plan Note (Addendum)
C/w possible infection and allergy related, for zpack, zyrtec asd, OTC mucinex prn,  to f/u any worsening symptoms or concerns, considier ENT referral  In addition to the time spent performing CPE, I spent an additional 15 minutes face to face,in which greater than 50% of this time was spent in counseling and coordination of care for patient's illness as documented, including the differential diagnosis, evaluation and management of left ear pain

## 2016-11-08 ENCOUNTER — Telehealth: Payer: Self-pay | Admitting: Internal Medicine

## 2016-11-08 DIAGNOSIS — J3489 Other specified disorders of nose and nasal sinuses: Secondary | ICD-10-CM

## 2016-11-08 NOTE — Telephone Encounter (Signed)
Pt would like to have the referral sent to ENT.

## 2016-11-08 NOTE — Telephone Encounter (Signed)
Ok, this is done 

## 2016-11-16 DIAGNOSIS — L400 Psoriasis vulgaris: Secondary | ICD-10-CM | POA: Diagnosis not present

## 2016-11-23 DIAGNOSIS — R42 Dizziness and giddiness: Secondary | ICD-10-CM | POA: Diagnosis not present

## 2016-11-23 DIAGNOSIS — H6123 Impacted cerumen, bilateral: Secondary | ICD-10-CM | POA: Diagnosis not present

## 2017-01-25 ENCOUNTER — Other Ambulatory Visit: Payer: Self-pay | Admitting: Internal Medicine

## 2017-03-03 IMAGING — US US SOFT TISSUE HEAD/NECK
1 series · 14 of 25 positions shown · non-contrast
Comparison: None.

CLINICAL DATA: Left neck pain x6 months

EXAM:
THYROID ULTRASOUND
TECHNIQUE: Ultrasound examination of the thyroid gland and adjacent soft
tissues was performed.

[Series 1: us soft tissue head/neck · 0.06mm/px · 14 of 32 slices shown]
[im 1/32]
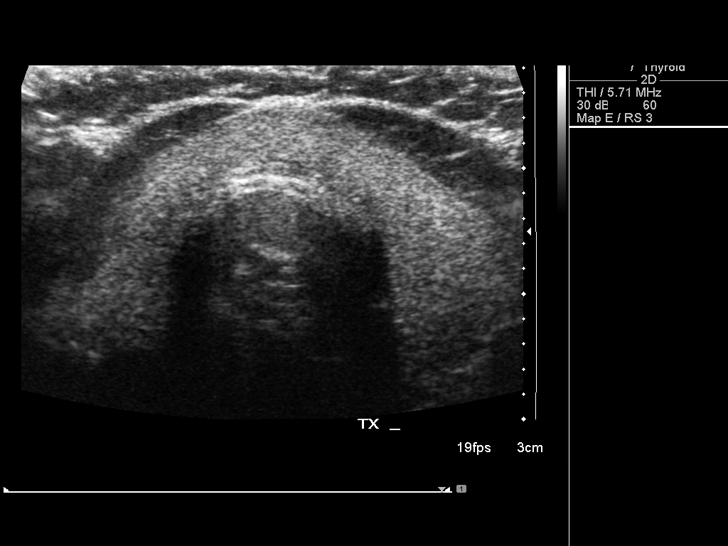
[im 3/32]
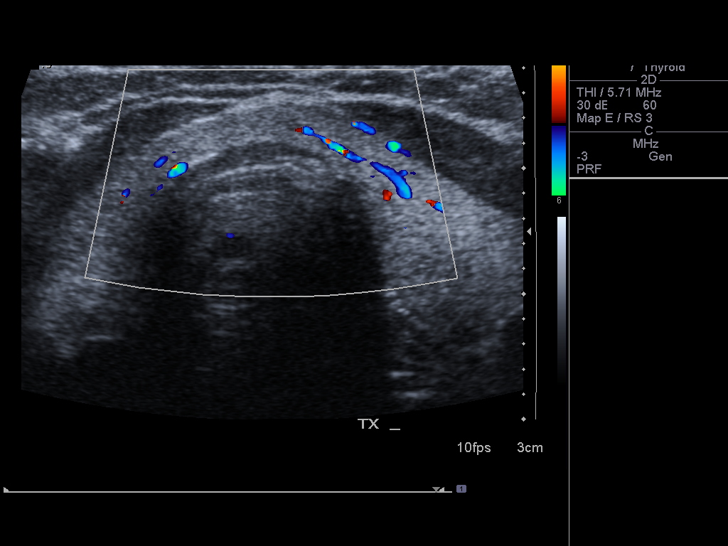
[im 6/32]
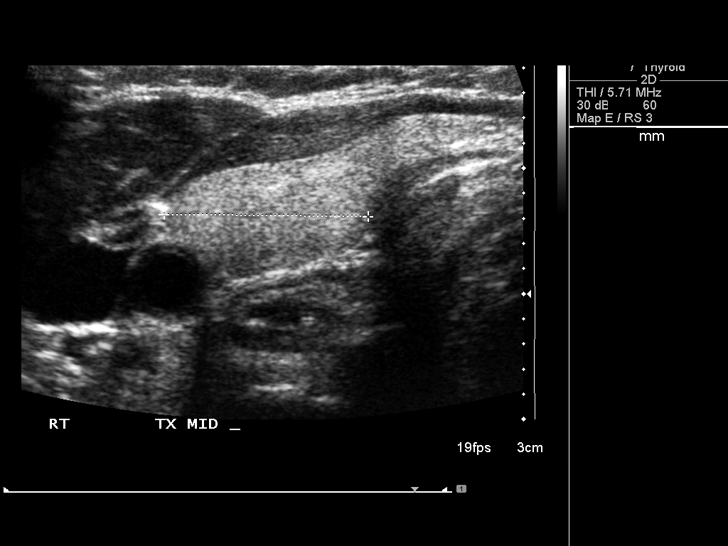
[im 8/32]
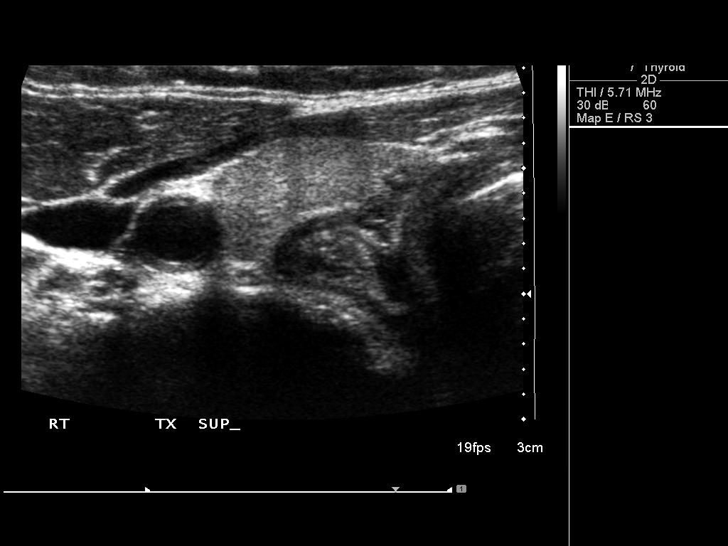
[im 11/32]
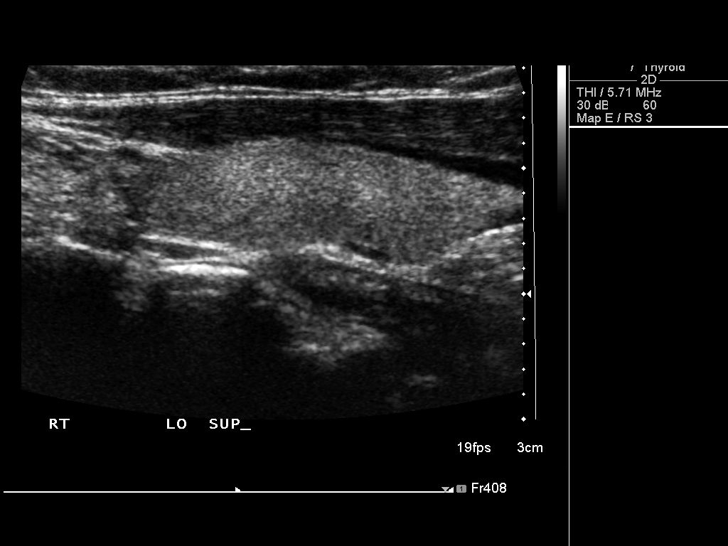
[im 12/32]
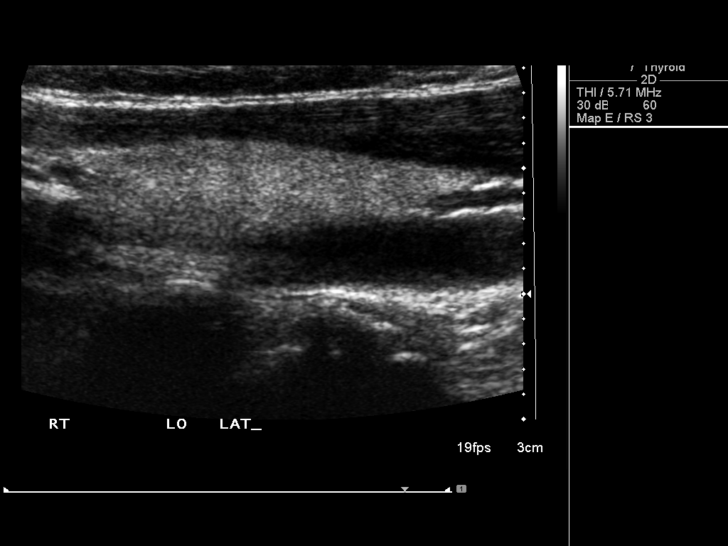
[im 15/32]
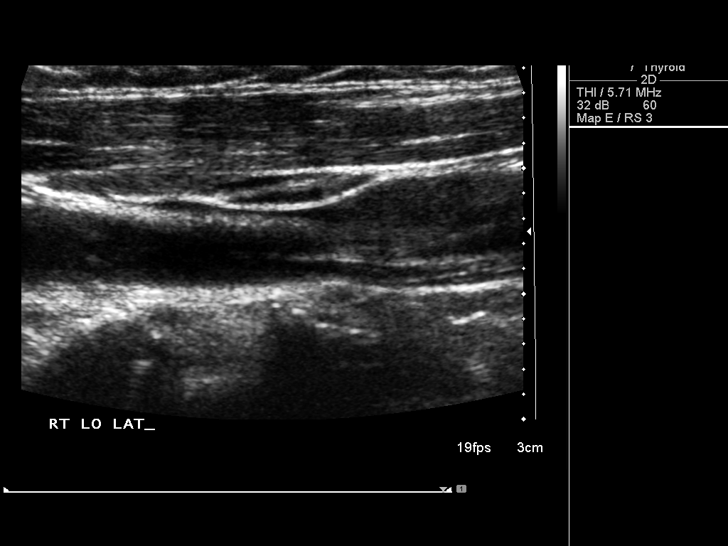
[im 17/32]
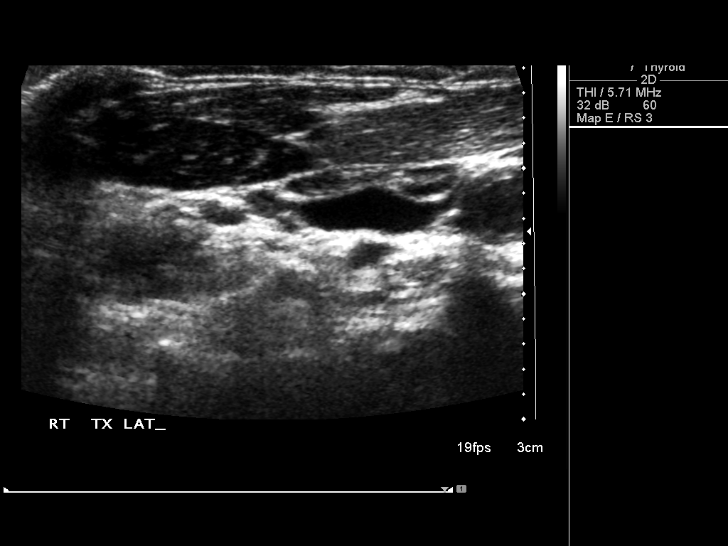
[im 20/32]
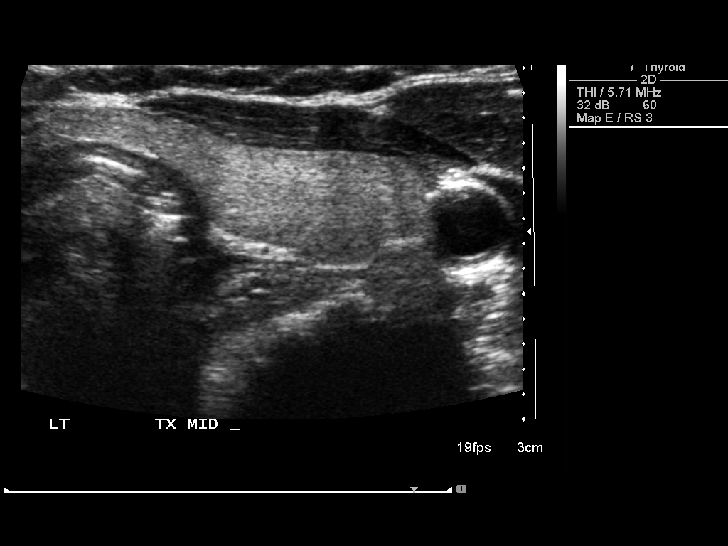
[im 21/32]
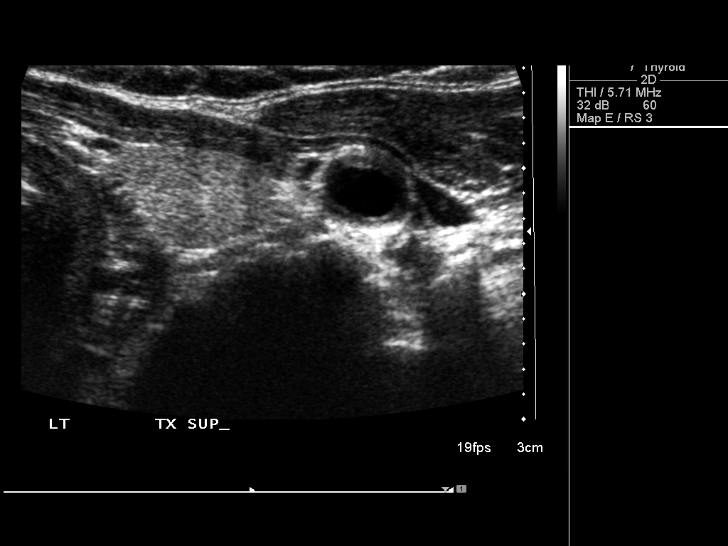
[im 24/32]
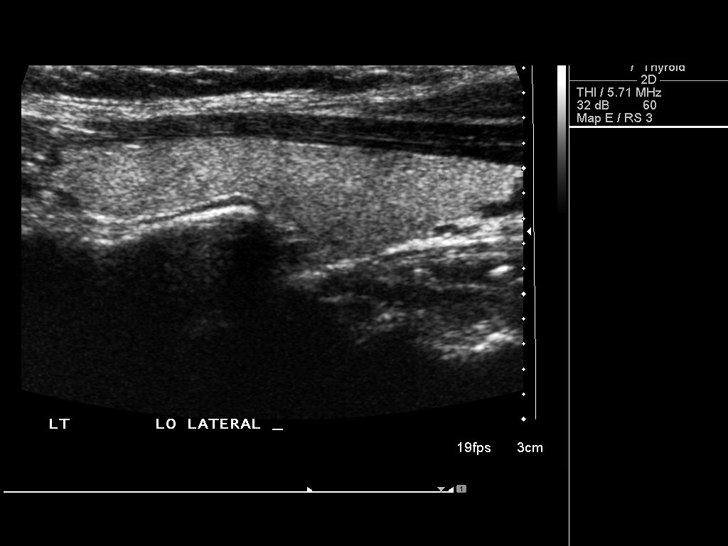
[im 26/32]
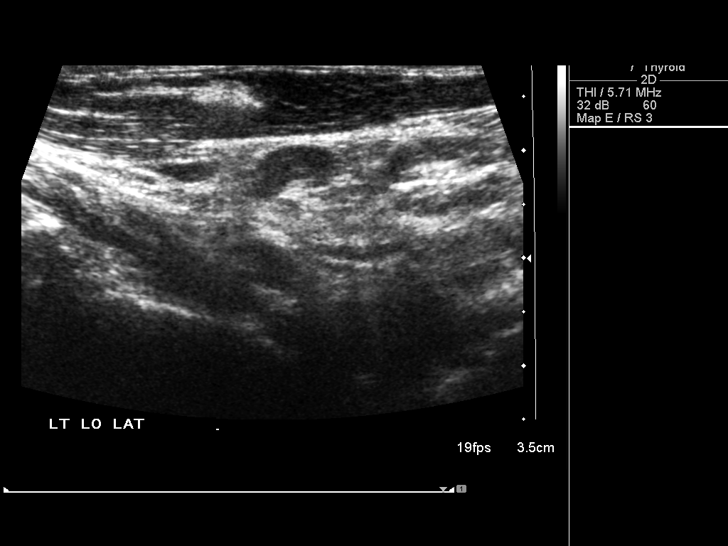
[im 29/32]
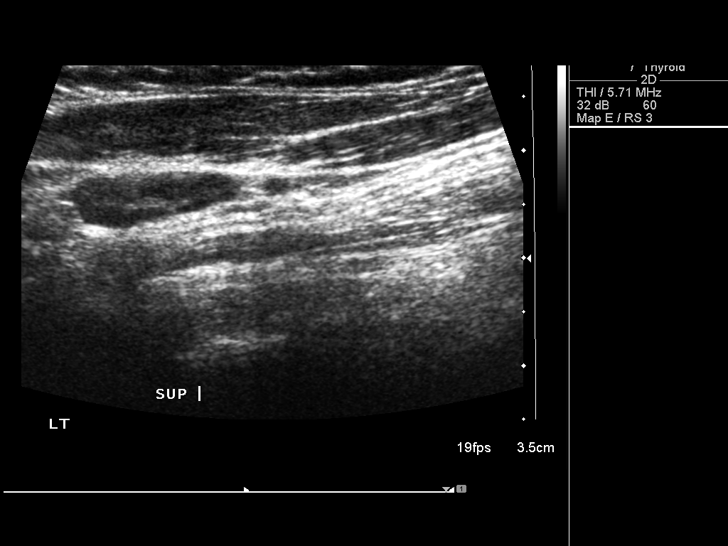
[im 32/32]
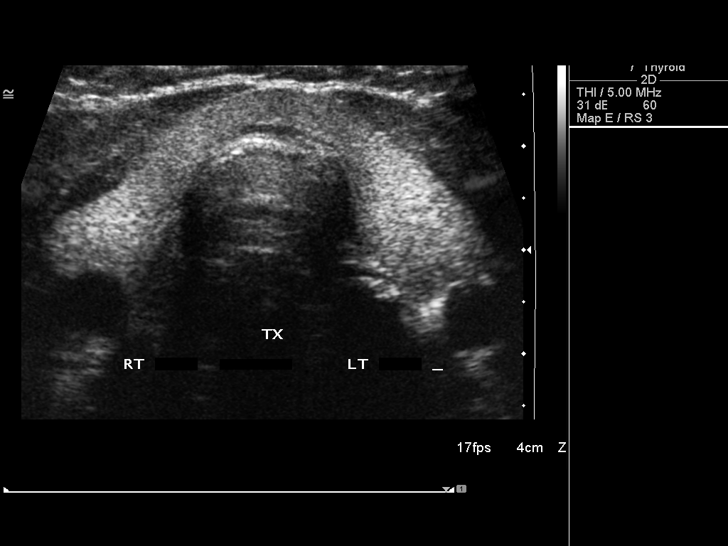

[14 of 25 positions shown; findings below may reference images not displayed]

FINDINGS: Parenchymal Echotexture: Normal

Estimated total number of nodules >/= 1 cm: 0

Number of spongiform nodules >/=  2 cm not described below (TR1): 0

Number of mixed cystic and solid nodules >/= 1.5 cm not described
below (TR2): 0

_________________________________________________________

Isthmus: 0.6 cm thickness

No discrete nodules are identified within the thyroid isthmus.

_________________________________________________________

Right lobe: 3.9 x 0.9 x 1.6 cm

No discrete nodules are identified within the right lobe of the
thyroid.

_________________________________________________________

Left lobe: 4.3 x 1 x 1.8 cm

No discrete nodules are identified within the left lobe of the
thyroid.

No cervical adenopathy identified on limited scanning.
IMPRESSION: Negative

The above is in keeping with the ACR TI-RADS recommendations - [HOSPITAL] 8822;[DATE].

## 2017-04-03 DIAGNOSIS — L400 Psoriasis vulgaris: Secondary | ICD-10-CM | POA: Diagnosis not present

## 2017-04-03 DIAGNOSIS — Z79899 Other long term (current) drug therapy: Secondary | ICD-10-CM | POA: Diagnosis not present

## 2017-04-24 ENCOUNTER — Other Ambulatory Visit: Payer: Self-pay | Admitting: Internal Medicine

## 2017-10-18 ENCOUNTER — Encounter: Payer: Self-pay | Admitting: Gastroenterology

## 2017-11-01 ENCOUNTER — Ambulatory Visit (INDEPENDENT_AMBULATORY_CARE_PROVIDER_SITE_OTHER): Payer: Medicare Other | Admitting: Internal Medicine

## 2017-11-01 ENCOUNTER — Encounter: Payer: Self-pay | Admitting: Internal Medicine

## 2017-11-01 VITALS — BP 136/84 | HR 62 | Temp 98.8°F | Ht 61.0 in | Wt 151.0 lb

## 2017-11-01 DIAGNOSIS — Z23 Encounter for immunization: Secondary | ICD-10-CM

## 2017-11-01 DIAGNOSIS — Z0000 Encounter for general adult medical examination without abnormal findings: Secondary | ICD-10-CM

## 2017-11-01 DIAGNOSIS — R350 Frequency of micturition: Secondary | ICD-10-CM | POA: Diagnosis not present

## 2017-11-01 DIAGNOSIS — L408 Other psoriasis: Secondary | ICD-10-CM | POA: Diagnosis not present

## 2017-11-01 DIAGNOSIS — Z0001 Encounter for general adult medical examination with abnormal findings: Secondary | ICD-10-CM

## 2017-11-01 MED ORDER — OXYBUTYNIN CHLORIDE ER 5 MG PO TB24: 5.0000 mg | ORAL_TABLET | Freq: Every day | ORAL | 3 refills | Status: DC

## 2017-11-01 NOTE — Addendum Note (Signed)
Addended by: Corwin LevinsJOHN, Oyindamola Key W on: 11/01/2017 01:36 PM   Modules accepted: Orders

## 2017-11-01 NOTE — Patient Instructions (Addendum)
You had the Tdap tetanus shot today, and the Prevnar 13 pneumonia shot  You will be contacted regarding the referral for: [facility]  Please take all new medication as prescribed  - the medication for overactive bladder  Please continue all other medications as before, and refills have been done if requested.  Please have the pharmacy call with any other refills you may need.  Please continue your efforts at being more active, low cholesterol diet, and weight control.  You are otherwise up to date with prevention measures today.  Please keep your appointments with your specialists as you may have planned  Please go to the LAB in the Basement (turn left off the elevator) for the tests to be done today  You will be contacted by phone if any changes need to be made immediately.  Otherwise, you will receive a letter about your results with an explanation, but please check with MyChart first.  Please remember to sign up for MyChart if you have not done so, as this will be important to you in the future with finding out test results, communicating by private email, and scheduling acute appointments online when needed.  Please return in 1 year for your yearly visit, or sooner if needed, with Lab testing done 3-5 days before

## 2017-11-01 NOTE — Addendum Note (Signed)
Addended by: Roney MansGAY, Tobyn Osgood on: 11/01/2017 01:39 PM   Modules accepted: Orders

## 2017-11-01 NOTE — Assessment & Plan Note (Signed)

## 2017-11-01 NOTE — Assessment & Plan Note (Signed)
Ok for derm referral [facility]

## 2017-11-01 NOTE — Progress Notes (Addendum)
Subjective:    Patient ID: Victoria Hale, female    DOB: [birth year], 67 y.o.   MRN: [id number]  HPI  Here for wellness and f/u;  Overall doing ok;  Pt denies Chest pain, worsening SOB, DOE, wheezing, orthopnea, PND, worsening LE edema, palpitations, dizziness or syncope.  Pt denies neurological change such as new headache, facial or extremity weakness.  Pt denies polydipsia, polyuria, or low sugar symptoms. Pt states overall good compliance with treatment and medications, good tolerability, and has been trying to follow appropriate diet.  Pt denies suicidal ideation or panic. No fever, night sweats, wt loss, loss of appetite, or other constitutional symptoms.  Pt states good ability with ADL's, has low fall risk, home safety reviewed and adequate, no other significant changes in hearing or vision, and only occasionally active with exercise.  Has worsening mild depressive symptoms but mild and declines referral for counseling or med tx.   Carries dx of Systemic dermatitis psoriasis., not clearing up though with MTX 4 pills per wk.  Asks for second opinion such as [facility].   Also Denies urinary symptoms such as dysuria, flank pain, hematuria or n/v, fever, chills, but has many months onset urinary freq and urgency, sometime with wetting accidents. Past Medical History:  Diagnosis Date  . ALLERGIC RHINITIS 03/02/2007  . ALOPECIA TOTALIS 03/02/2007  . ANA POSITIVE 09/03/2009  . ANOMALY, CONGENITAL, KIDNEY NEC 03/02/2007  . COLONIC POLYPS, HX OF 03/02/2007  . Cough 10/09/2008  . DEGENERATION, LUMBAR/LUMBOSACRAL DISC 03/02/2007  . DERMATITIS, ATOPIC 03/02/2007  . DERMATITIS, SEBORRHEIC 12/04/2007  . DYSPNEA 03/01/2007  . GERD 03/02/2007  . Hidradenitis 03/05/2010  . HYPERTENSION, BENIGN 11/28/2005  . LACTOSE INTOLERANCE 03/02/2007  . NEPHROLITHIASIS, HX OF 03/02/2007  . OTITIS EXTERNA, CHRONIC NEC 03/02/2007  . OVERACTIVE BLADDER 09/03/2009  . Overweight(278.02) 03/02/2007  . PALPITATIONS, RECURRENT  03/02/2007  . PHOTOALLERGIC DERMATITIS 03/02/2007  . PSORIASIS 08/28/2008  . THALASSEMIA NEC 03/02/2007  . Unspecified anemia 10/09/2008   Past Surgical History:  Procedure Laterality Date  . APPENDECTOMY     ?  . CESAREAN SECTION    . exploratory abdominal surgury  1970's  . s/p BTL    . s/p C-section twice      reports that she quit smoking about 31 years ago. She has never used smokeless tobacco. She reports that she drinks about 0.6 oz of alcohol per week. She reports that she does not use drugs. family history includes Breast cancer (age of onset: 65) in her mother; Heart attack (age of onset: 65) in her father; Heart disease (age of onset: 72) in her sister. No Known Allergies Current Outpatient Medications on File Prior to Visit  Medication Sig Dispense Refill  . amLODipine (NORVASC) 2.5 MG tablet TAKE ONE TABLET BY MOUTH ONCE DAILY 90 tablet 1  . aspirin EC 81 MG tablet Take 1 tablet (81 mg total) by mouth daily. 90 tablet 11  . atorvastatin (LIPITOR) 10 MG tablet TAKE 1 TABLET BY MOUTH EVERY DAY 90 tablet 2  . cetirizine (ZYRTEC) 10 MG tablet Take 1 tablet (10 mg total) by mouth daily. 30 tablet 11   No current facility-administered medications on file prior to visit.    Review of Systems Constitutional: Negative for other unusual diaphoresis, sweats, appetite or weight changes HENT: Negative for other worsening hearing loss, ear pain, facial swelling, mouth sores or neck stiffness.   Eyes: Negative for other worsening pain, redness or other visual disturbance.  Respiratory: Negative for other  stridor or swelling Cardiovascular: Negative for other palpitations or other chest pain  Gastrointestinal: Negative for worsening diarrhea or loose stools, blood in stool, distention or other pain Genitourinary: Negative for hematuria, flank pain or other change in urine volume.  Musculoskeletal: Negative for myalgias or other joint swelling.  Skin: Negative for other color change, or  other wound or worsening drainage.  Neurological: Negative for other syncope or numbness. Hematological: Negative for other adenopathy or swelling Psychiatric/Behavioral: Negative for hallucinations, other worsening agitation, SI, self-injury, or new decreased concentration All other system neg per pt    Objective:   Physical Exam BP 136/84   Pulse 62   Temp 98.8 F (37.1 C) (Oral)   Ht 5\' 1"  (1.549 m)   Wt 151 lb (68.5 kg)   SpO2 96%   BMI 28.53 kg/m  VS noted,  Constitutional: Pt is oriented to person, place, and time. Appears well-developed and well-nourished, in no significant distress and comfortable Head: Normocephalic and atraumatic  Eyes: Conjunctivae and EOM are normal. Pupils are equal, round, and reactive to light Right Ear: External ear normal without discharge Left Ear: External ear normal without discharge Nose: Nose without discharge or deformity Mouth/Throat: Oropharynx is without other ulcerations and moist  Neck: Normal range of motion. Neck supple. No JVD present. No tracheal deviation present or significant neck LA or mass Cardiovascular: Normal rate, regular rhythm, normal heart sounds and intact distal pulses.   Pulmonary/Chest: WOB normal and breath sounds without rales or wheezing  Abdominal: Soft. Bowel sounds are normal. NT. No HSM  Musculoskeletal: Normal range of motion. Exhibits no edema Lymphadenopathy: Has no other cervical adenopathy.  Neurological: Pt is alert and oriented to person, place, and time. Pt has normal reflexes. No cranial nerve deficit. Motor grossly intact, Gait intact Skin: Skin is warm and dry. No rash noted or new ulcerations Psychiatric:  Has dysphoric mood and affect. Behavior is normal without agitation No other exam findings Lab Results  Component Value Date   WBC 6.0 10/26/2016   HGB 12.7 10/26/2016   HCT 39.8 10/26/2016   PLT 176.0 10/26/2016   GLUCOSE 97 10/26/2016   CHOL 143 10/26/2016   TRIG 140.0 10/26/2016   HDL  46.50 10/26/2016   LDLCALC 69 10/26/2016   ALT 9 10/26/2016   AST 14 10/26/2016   NA 140 10/26/2016   K 4.5 10/26/2016   CL 103 10/26/2016   CREATININE 0.83 10/26/2016   BUN 12 10/26/2016   CO2 35 (H) 10/26/2016   TSH 1.13 10/26/2016   HGBA1C 6.5 10/02/2015       Assessment & Plan:

## 2017-11-01 NOTE — Assessment & Plan Note (Signed)
Likely oab, for ua with labs, also trial oxybutnin ER 5

## 2017-11-02 ENCOUNTER — Other Ambulatory Visit (INDEPENDENT_AMBULATORY_CARE_PROVIDER_SITE_OTHER): Payer: Medicare Other

## 2017-11-02 DIAGNOSIS — Z0001 Encounter for general adult medical examination with abnormal findings: Secondary | ICD-10-CM

## 2017-11-02 LAB — CBC WITH DIFFERENTIAL/PLATELET
Basophils Absolute: 0 10*3/uL (ref 0.0–0.1)
Basophils Relative: 0.6 % (ref 0.0–3.0)
EOS ABS: 0.2 10*3/uL (ref 0.0–0.7)
Eosinophils Relative: 2.5 % (ref 0.0–5.0)
HEMATOCRIT: 40.7 % (ref 36.0–46.0)
HEMOGLOBIN: 12.7 g/dL (ref 12.0–15.0)
LYMPHS PCT: 21.4 % (ref 12.0–46.0)
Lymphs Abs: 1.4 10*3/uL (ref 0.7–4.0)
MCHC: 31.3 g/dL (ref 30.0–36.0)
MCV: 69.9 fl — ABNORMAL LOW (ref 78.0–100.0)
MONO ABS: 0.4 10*3/uL (ref 0.1–1.0)
Monocytes Relative: 5.7 % (ref 3.0–12.0)
Neutro Abs: 4.6 10*3/uL (ref 1.4–7.7)
Neutrophils Relative %: 69.8 % (ref 43.0–77.0)
Platelets: 173 10*3/uL (ref 150.0–400.0)
RBC: 5.83 Mil/uL — AB (ref 3.87–5.11)
RDW: 16.6 % — ABNORMAL HIGH (ref 11.5–15.5)
WBC: 6.5 10*3/uL (ref 4.0–10.5)

## 2017-11-02 LAB — HEPATIC FUNCTION PANEL
ALK PHOS: 87 U/L (ref 39–117)
ALT: 10 U/L (ref 0–35)
AST: 14 U/L (ref 0–37)
Albumin: 3.9 g/dL (ref 3.5–5.2)
BILIRUBIN DIRECT: 0.1 mg/dL (ref 0.0–0.3)
BILIRUBIN TOTAL: 0.7 mg/dL (ref 0.2–1.2)
Total Protein: 7.1 g/dL (ref 6.0–8.3)

## 2017-11-02 LAB — LIPID PANEL
Cholesterol: 143 mg/dL (ref 0–200)
HDL: 44.9 mg/dL (ref >39.00)
LDL Cholesterol: 78 mg/dL (ref 0–99)
NONHDL: 97.9
Total CHOL/HDL Ratio: 3
Triglycerides: 98 mg/dL (ref 0.0–149.0)
VLDL: 19.6 mg/dL (ref 0.0–40.0)

## 2017-11-02 LAB — BASIC METABOLIC PANEL
BUN: 10 mg/dL (ref 6–23)
CALCIUM: 9.2 mg/dL (ref 8.4–10.5)
CO2: 30 mEq/L (ref 19–32)
CREATININE: 0.72 mg/dL (ref 0.40–1.20)
Chloride: 106 mEq/L (ref 96–112)
GFR: 103.79 mL/min (ref >60.00)
GLUCOSE: 106 mg/dL — AB (ref 70–99)
Potassium: 4.3 mEq/L (ref 3.5–5.1)
Sodium: 140 mEq/L (ref 135–145)

## 2017-11-02 LAB — URINALYSIS, ROUTINE W REFLEX MICROSCOPIC
BILIRUBIN URINE: NEGATIVE
Hgb urine dipstick: NEGATIVE
KETONES UR: NEGATIVE
LEUKOCYTES UA: NEGATIVE
Nitrite: NEGATIVE
RBC / HPF: NONE SEEN (ref 0–?)
SPECIFIC GRAVITY, URINE: 1.01 (ref 1.000–1.030)
Total Protein, Urine: NEGATIVE
UROBILINOGEN UA: 0.2 (ref 0.0–1.0)
Urine Glucose: NEGATIVE
WBC UA: NONE SEEN (ref 0–?)
pH: 6.5 (ref 5.0–8.0)

## 2017-11-02 LAB — TSH: TSH: 1.21 u[IU]/mL (ref 0.35–4.50)

## 2017-11-21 ENCOUNTER — Other Ambulatory Visit: Payer: Self-pay | Admitting: Internal Medicine

## 2017-12-08 ENCOUNTER — Other Ambulatory Visit: Payer: Self-pay | Admitting: Internal Medicine

## 2017-12-12 ENCOUNTER — Encounter: Payer: Self-pay | Admitting: Family

## 2017-12-12 ENCOUNTER — Ambulatory Visit (INDEPENDENT_AMBULATORY_CARE_PROVIDER_SITE_OTHER)
Admission: RE | Admit: 2017-12-12 | Discharge: 2017-12-12 | Disposition: A | Discharge status: Home / Self Care | Payer: Medicare Other | Source: Ambulatory Visit | Attending: Family | Admitting: Family

## 2017-12-12 ENCOUNTER — Ambulatory Visit (INDEPENDENT_AMBULATORY_CARE_PROVIDER_SITE_OTHER): Payer: Medicare Other | Admitting: Family

## 2017-12-12 VITALS — BP 132/72 | HR 70 | Temp 98.4°F | Ht 61.0 in | Wt 146.1 lb

## 2017-12-12 DIAGNOSIS — J209 Acute bronchitis, unspecified: Secondary | ICD-10-CM | POA: Diagnosis not present

## 2017-12-12 DIAGNOSIS — Z1231 Encounter for screening mammogram for malignant neoplasm of breast: Secondary | ICD-10-CM

## 2017-12-12 DIAGNOSIS — M79601 Pain in right arm: Secondary | ICD-10-CM

## 2017-12-12 DIAGNOSIS — M4802 Spinal stenosis, cervical region: Secondary | ICD-10-CM | POA: Diagnosis not present

## 2017-12-12 MED ORDER — DOXYCYCLINE HYCLATE 100 MG PO TABS: 100.0000 mg | ORAL_TABLET | Freq: Two times a day (BID) | ORAL | 0 refills | Status: DC

## 2017-12-12 NOTE — Progress Notes (Signed)
Victoria Hale is a 67 y.o. female with the following history as recorded in [employer]:  Patient Active Problem List   Diagnosis Date Noted  . Ear pain, left 10/26/2016  . Neck pain 05/10/2016  . Hyperglycemia 04/08/2016  . Thyroid disease 04/08/2016  . Rash 04/08/2016  . Urinary frequency 04/08/2016  . Degenerative cervical disc 10/16/2015  . Pain and swelling of left shoulder 10/02/2015  . Psoriasiform seborrheic dermatitis 06/26/2014  . Tonsillitis 12/28/2012  . Mixed hyperlipidemia 12/28/2011  . Chronic LBP 11/15/2011  . Neck pain on left side 11/15/2011  . Odynophagia 02/18/2011  . Neck pain, acute 01/24/2011  . Low back pain radiating to right leg 01/24/2011  . Anemia, iron deficiency 09/13/2010  . Preventative health care 09/10/2010  . Hidradenitis 03/05/2010  . OVERACTIVE BLADDER 09/03/2009  . ANA positive 09/03/2009  . PSORIASIS 08/28/2008  . DERMATITIS, SEBORRHEIC 12/04/2007  . LACTOSE INTOLERANCE 03/02/2007  . THALASSEMIA NEC 03/02/2007  . OTITIS EXTERNA, CHRONIC NEC 03/02/2007  . ALLERGIC RHINITIS 03/02/2007  . GERD 03/02/2007  . Atopic dermatitis 03/02/2007  . PHOTOALLERGIC DERMATITIS 03/02/2007  . ALOPECIA TOTALIS 03/02/2007  . DEGENERATION, LUMBAR/LUMBOSACRAL DISC 03/02/2007  . ANOMALY, CONGENITAL, KIDNEY NEC 03/02/2007  . PALPITATIONS, RECURRENT 03/02/2007  . COLONIC POLYPS, HX OF 03/02/2007  . NEPHROLITHIASIS, HX OF 03/02/2007  . HYPERTENSION, BENIGN 11/28/2005    Current Outpatient Medications  Medication Sig Dispense Refill  . amLODipine (NORVASC) 2.5 MG tablet TAKE 1 TABLET BY MOUTH ONCE DAILY 90 tablet 3  . aspirin EC 81 MG tablet Take 1 tablet (81 mg total) by mouth daily. 90 tablet 11  . atorvastatin (LIPITOR) 10 MG tablet TAKE 1 TABLET BY MOUTH EVERY DAY 90 tablet 1  . folic acid (FOLVITE) 1 MG tablet Take 1 mg by mouth daily.    Marland Kitchen. doxycycline (VIBRA-TABS) 100 MG tablet Take 1 tablet (100 mg total) by mouth 2 (two) times daily. 20 tablet 0    No current facility-administered medications for this visit.     Allergies: Patient has no known allergies.  Past Medical History:  Diagnosis Date  . ALLERGIC RHINITIS 03/02/2007  . ALOPECIA TOTALIS 03/02/2007  . ANA POSITIVE 09/03/2009  . ANOMALY, CONGENITAL, KIDNEY NEC 03/02/2007  . COLONIC POLYPS, HX OF 03/02/2007  . Cough 10/09/2008  . DEGENERATION, LUMBAR/LUMBOSACRAL DISC 03/02/2007  . DERMATITIS, ATOPIC 03/02/2007  . DERMATITIS, SEBORRHEIC 12/04/2007  . DYSPNEA 03/01/2007  . GERD 03/02/2007  . Hidradenitis 03/05/2010  . HYPERTENSION, BENIGN 11/28/2005  . LACTOSE INTOLERANCE 03/02/2007  . NEPHROLITHIASIS, HX OF 03/02/2007  . OTITIS EXTERNA, CHRONIC NEC 03/02/2007  . OVERACTIVE BLADDER 09/03/2009  . Overweight(278.02) 03/02/2007  . PALPITATIONS, RECURRENT 03/02/2007  . PHOTOALLERGIC DERMATITIS 03/02/2007  . PSORIASIS 08/28/2008  . THALASSEMIA NEC 03/02/2007  . Unspecified anemia 10/09/2008    Past Surgical History:  Procedure Laterality Date  . APPENDECTOMY     ?  . CESAREAN SECTION    . exploratory abdominal surgury  1970's  . s/p BTL    . s/p C-section twice      Family History  Problem Relation Age of Onset  . Breast cancer Mother 1260       diagnosed  . Heart attack Father 2080  . Heart disease Sister 6648       had heart valve replaced    Social History   Tobacco Use  . Smoking status: Former Smoker    Last attempt to quit: 07/27/1986    Years since quitting: 31.4  . Smokeless tobacco:  Never Used  Substance Use Topics  . Alcohol use: Yes    Alcohol/week: 0.6 oz    Types: 1 Glasses of wine per week    Comment: glass of wine weekend    Subjective:  Started Saturday with sudden onset of "bad sounding cough." Has been taking OTC Mucinex with some relief; does feel better but symptoms are still present; decreased appetite/ trying to stay hydrated; + thick phlegm; not prone to bronchitis or asthma; denies any chest pain or shortness of breath;  Also mentions that since last Friday  evening, she has been experiencing some pain in her right breast and pain upper under her right arm; has known history of fibrocystic changes; last mammogram in November 2017;     Objective:  Vitals:   12/12/17 1343  BP: 132/72  Pulse: 70  Temp: 98.4 F (36.9 C)  TempSrc: Oral  SpO2: 95%  Weight: 146 lb 1.6 oz (66.3 kg)  Height: 5\' 1"  (1.549 m)    General: Well developed, well nourished, in no acute distress  Skin : Warm and dry.  Head: Normocephalic and atraumatic  Eyes: Sclera and conjunctiva clear; pupils round and reactive to light; extraocular movements intact  Ears: External normal; canals clear; tympanic membranes normal  Oropharynx: Pink, supple. No suspicious lesions  Neck: Supple without thyromegaly, adenopathy  Lungs: Respirations unlabored; wheezing noted in bilateral lower lobes  CVS exam: normal rate and regular rhythm.  Neurologic: Alert and oriented; speech intact; face symmetrical; moves all extremities well; CNII-XII intact without focal deficit   Assessment:  1. Acute bronchitis, unspecified organism   2. Screening mammogram, encounter for   3. Right arm pain     Plan:  1. Rx for Doxycycline 100 mg bid x 10 days; sample of BREO 100 qd x 10 days; follow-up worse, no better- may need to update CXR; 2. Update screening mammogram; 3. Update cervical X-ray; follow-up to be determined.   No follow-ups on file.  Orders Placed This Encounter  Procedures  . MM Digital Screening    Standing Status:   Future    Standing Expiration Date:   02/13/2019    Order Specific Question:   Reason for Exam (SYMPTOM  OR DIAGNOSIS REQUIRED)    Answer:   screening mammogram    Order Specific Question:   Preferred imaging location?    Answer:   [facility]  . DG Cervical Spine 2 or 3 views    Standing Status:   Future    Number of Occurrences:   1    Standing Expiration Date:   02/13/2019    Order Specific Question:   Reason for Exam (SYMPTOM  OR DIAGNOSIS REQUIRED)     Answer:   right arm pain/ radiating pain    Order Specific Question:   Preferred imaging location?    Answer:   [facility]    Order Specific Question:   Radiology Contrast Protocol - do NOT remove file path    Answer:   [email or web address]    Requested Prescriptions   Signed Prescriptions Disp Refills  . doxycycline (VIBRA-TABS) 100 MG tablet 20 tablet 0    Sig: Take 1 tablet (100 mg total) by mouth 2 (two) times daily.

## 2017-12-14 ENCOUNTER — Telehealth: Payer: Self-pay | Admitting: Internal Medicine

## 2017-12-14 NOTE — Telephone Encounter (Signed)
Copied from CRM [phone]. Topic: Quick Communication - See Telephone Encounter >> Dec 14, 2017 11:26 AM Jolayne Hainesaylor, Brittany L wrote: CRM for notification. See Telephone encounter for: 12/14/17.  Patient is requesting her xray results from 7/16. Please call patient.

## 2017-12-15 NOTE — Progress Notes (Signed)
Appointment scheduled.

## 2017-12-16 ENCOUNTER — Ambulatory Visit (HOSPITAL_COMMUNITY)
Admission: EM | Admit: 2017-12-16 | Discharge: 2017-12-16 | Disposition: A | Discharge status: Home / Self Care | Payer: Medicare Other | Attending: Family Medicine | Admitting: Family Medicine

## 2017-12-16 ENCOUNTER — Other Ambulatory Visit: Payer: Self-pay

## 2017-12-16 ENCOUNTER — Encounter (HOSPITAL_COMMUNITY): Payer: Self-pay

## 2017-12-16 DIAGNOSIS — M25511 Pain in right shoulder: Secondary | ICD-10-CM

## 2017-12-16 MED ORDER — PREDNISONE 20 MG PO TABS: 40.0000 mg | ORAL_TABLET | Freq: Every day | ORAL | 0 refills | Status: AC

## 2017-12-16 NOTE — Discharge Instructions (Signed)
Heat (pad or rice pillow in microwave) over affected area, 10-15 minutes twice daily.   Ice/cold pack over area for 10-15 min twice daily.  OK to take Tylenol 1000 mg (2 extra strength tabs) or 975 mg (3 regular strength tabs) every 6 hours as needed.  EXERCISES  RANGE OF MOTION (ROM) AND STRETCHING EXERCISES These exercises may help you when beginning to rehabilitate your injury. While completing these exercises, remember:   Restoring tissue flexibility helps normal motion to return to the joints. This allows healthier, less painful movement and activity.  An effective stretch should be held for at least 30 seconds.  A stretch should never be painful. You should only feel a gentle lengthening or release in the stretched tissue.  ROM - Pendulum  Bend at the waist so that your right / left arm falls away from your body. Support yourself with your opposite hand on a solid surface, such as a table or a countertop.  Your right / left arm should be perpendicular to the ground. If it is not perpendicular, you need to lean over farther. Relax the muscles in your right / left arm and shoulder as much as possible.  Gently sway your hips and trunk so they move your right / left arm without any use of your right / left shoulder muscles.  Progress your movements so that your right / left arm moves side to side, then forward and backward, and finally, both clockwise and counterclockwise.  Complete 10-15 repetitions in each direction. Many people use this exercise to relieve discomfort in their shoulder as well as to gain range of motion. Repeat 2 times. Complete this exercise 3 times per week.  STRETCH - Flexion, Standing  Stand with good posture. With an underhand grip on your right / left hand and an overhand grip on the opposite hand, grasp a broomstick or cane so that your hands are a little more than shoulder-width apart.  Keeping your right / left elbow straight and shoulder muscles  relaxed, push the stick with your opposite hand to raise your right / left arm in front of your body and then overhead. Raise your arm until you feel a stretch in your right / left shoulder, but before you have increased shoulder pain.  Try to avoid shrugging your right / left shoulder as your arm rises by keeping your shoulder blade tucked down and toward your mid-back spine. Hold 30 seconds.  Slowly return to the starting position. Repeat 2 times. Complete this exercise 3 times per week.  STRETCH - Internal Rotation  Place your right / left hand behind your back, palm-up.  Throw a towel or belt over your opposite shoulder. Grasp the towel/belt with your right / left hand.  While keeping an upright posture, gently pull up on the towel/belt until you feel a stretch in the front of your right / left shoulder.  Avoid shrugging your right / left shoulder as your arm rises by keeping your shoulder blade tucked down and toward your mid-back spine.  Hold 30. Release the stretch by lowering your opposite hand. Repeat 2 times. Complete this exercise 3 times per week.  STRETCH - External Rotation and Abduction  Stagger your stance through a doorframe. It does not matter which foot is forward.  As instructed by your physician, physical therapist or athletic trainer, place your hands: ? And forearms above your head and on the door frame. ? And forearms at head-height and on the door frame. ? At   elbow-height and on the door frame.  Keeping your head and chest upright and your stomach muscles tight to prevent over-extending your low-back, slowly shift your weight onto your front foot until you feel a stretch across your chest and/or in the front of your shoulders.  Hold 30 seconds. Shift your weight to your back foot to release the stretch. Repeat 2 times. Complete this stretch 3 times per week.   STRENGTHENING EXERCISES  These exercises may help you when beginning to rehabilitate your injury.  They may resolve your symptoms with or without further involvement from your physician, physical therapist or athletic trainer. While completing these exercises, remember:   Muscles can gain both the endurance and the strength needed for everyday activities through controlled exercises.  Complete these exercises as instructed by your physician, physical therapist or athletic trainer. Progress the resistance and repetitions only as guided.  You may experience muscle soreness or fatigue, but the pain or discomfort you are trying to eliminate should never worsen during these exercises. If this pain does worsen, stop and make certain you are following the directions exactly. If the pain is still present after adjustments, discontinue the exercise until you can discuss the trouble with your clinician.  If advised by your physician, during your recovery, avoid activity or exercises which involve actions that place your right / left hand or elbow above your head or behind your back or head. These positions stress the tissues which are trying to heal.  STRENGTH - Scapular Depression and Adduction  With good posture, sit on a firm chair. Supported your arms in front of you with pillows, arm rests or a table top. Have your elbows in line with the sides of your body.  Gently draw your shoulder blades down and toward your mid-back spine. Gradually increase the tension without tensing the muscles along the top of your shoulders and the back of your neck.  Hold for 3 seconds. Slowly release the tension and relax your muscles completely before completing the next repetition.  After you have practiced this exercise, remove the arm support and complete it in standing as well as sitting. Repeat 2 times. Complete this exercise 3 times per week.   STRENGTH - External Rotators  Secure a rubber exercise band/tubing to a fixed object so that it is at the same height as your right / left elbow when you are standing  or sitting on a firm surface.  Stand or sit so that the secured exercise band/tubing is at your side that is not injured.  Bend your elbow 90 degrees. Place a folded towel or small pillow under your right / left arm so that your elbow is a few inches away from your side.  Keeping the tension on the exercise band/tubing, pull it away from your body, as if pivoting on your elbow. Be sure to keep your body steady so that the movement is only coming from your shoulder rotating.  Hold 3 seconds. Release the tension in a controlled manner as you return to the starting position. Repeat 2 times. Complete this exercise 3 times per week.   STRENGTH - Supraspinatus  Stand or sit with good posture. Grasp a 2-3 lb weight or an exercise band/tubing so that your hand is "thumbs-up," like when you shake hands.  Slowly lift your right / left hand from your thigh into the air, traveling about 30 degrees from straight out at your side. Lift your hand to shoulder height or as far as you   can without increasing any shoulder pain. Initially, many people do not lift their hands above shoulder height.  Avoid shrugging your right / left shoulder as your arm rises by keeping your shoulder blade tucked down and toward your mid-back spine.  Hold for 3 seconds. Control the descent of your hand as you slowly return to your starting position. Repeat 2 times. Complete this exercise 3 times per week.   STRENGTH - Shoulder Extensors  Secure a rubber exercise band/tubing so that it is at the height of your shoulders when you are either standing or sitting on a firm arm-less chair.  With a thumbs-up grip, grasp an end of the band/tubing in each hand. Straighten your elbows and lift your hands straight in front of you at shoulder height. Step back away from the secured end of band/tubing until it becomes tense.  Squeezing your shoulder blades together, pull your hands down to the sides of your thighs. Do not allow your hands  to go behind you.  Hold for 3 seconds. Slowly ease the tension on the band/tubing as you reverse the directions and return to the starting position. Repeat 2 times. Complete this exercise 3 times per week.   STRENGTH - Scapular Retractors  Secure a rubber exercise band/tubing so that it is at the height of your shoulders when you are either standing or sitting on a firm arm-less chair.  With a palm-down grip, grasp an end of the band/tubing in each hand. Straighten your elbows and lift your hands straight in front of you at shoulder height. Step back away from the secured end of band/tubing until it becomes tense.  Squeezing your shoulder blades together, draw your elbows back as you bend them. Keep your upper arm lifted away from your body throughout the exercise.  Hold 3 seconds. Slowly ease the tension on the band/tubing as you reverse the directions and return to the starting position. Repeat 2 times. Complete this exercise 3 times per week.  STRENGTH - Scapular Depressors  Find a sturdy chair without wheels, such as a from a dining room table.  Keeping your feet on the floor, lift your bottom from the seat and lock your elbows.  Keeping your elbows straight, allow gravity to pull your body weight down. Your shoulders will rise toward your ears.  Raise your body against gravity by drawing your shoulder blades down your back, shortening the distance between your shoulders and ears. Although your feet should always maintain contact with the floor, your feet should progressively support less body weight as you get stronger.  Hold 3 seconds. In a controlled and slow manner, lower your body weight to begin the next repetition. Repeat 2 times. Complete this exercise 3 times per week.   This information is not intended to replace advice given to you by your health care provider. Make sure you discuss any questions you have with your health care provider.  Document Released: 03/30/2005  Document Revised: 06/06/2014 Document Reviewed: 08/28/2008 [employer] Interactive Patient Education 2016 [employer]  

## 2017-12-16 NOTE — ED Provider Notes (Signed)
  [facility]    CSN: [id number] Arrival date & time: 12/16/17  1011  Musculoskeletal Exam  Patient: Victoria Hale DOB: 1951-02-07  DOS: 12/16/2017  SUBJECTIVE:  Chief Complaint:   Chief Complaint  Patient presents with  . Numbness    Victoria Hale is a 67 y.o.  female for evaluation and treatment of R shoulder pain.   Onset:  8 days ago.  No inj or change in activity.  Location: R shoulder  Character:  aching and burning  Progression of issue:  has worsened Associated symptoms: Numbness/tingling Treatment: to date has been ASA.   Neurovascular symptoms: no  ROS: Musculoskeletal/Extremities: +R shoulder pain  Past Medical History:  Diagnosis Date  . ALLERGIC RHINITIS 03/02/2007  . ALOPECIA TOTALIS 03/02/2007  . ANA POSITIVE 09/03/2009  . ANOMALY, CONGENITAL, KIDNEY NEC 03/02/2007  . COLONIC POLYPS, HX OF 03/02/2007  . Cough 10/09/2008  . DEGENERATION, LUMBAR/LUMBOSACRAL DISC 03/02/2007  . DERMATITIS, ATOPIC 03/02/2007  . DERMATITIS, SEBORRHEIC 12/04/2007  . DYSPNEA 03/01/2007  . GERD 03/02/2007  . Hidradenitis 03/05/2010  . HYPERTENSION, BENIGN 11/28/2005  . LACTOSE INTOLERANCE 03/02/2007  . NEPHROLITHIASIS, HX OF 03/02/2007  . OTITIS EXTERNA, CHRONIC NEC 03/02/2007  . OVERACTIVE BLADDER 09/03/2009  . Overweight(278.02) 03/02/2007  . PALPITATIONS, RECURRENT 03/02/2007  . PHOTOALLERGIC DERMATITIS 03/02/2007  . PSORIASIS 08/28/2008  . THALASSEMIA NEC 03/02/2007  . Unspecified anemia 10/09/2008    Objective: VITAL SIGNS: BP (!) 157/69 (BP Location: Left Arm) Comment: 146/65 rt arm  Pulse 65   Temp 98.4 F (36.9 C) (Oral)   Resp 16   SpO2 96%  Constitutional: Well formed, well developed. No acute distress. Cardiovascular: Brisk cap refill Thorax & Lungs: No accessory muscle use Musculoskeletal: R shoulder.   Normal active range of motion: yes.   Normal passive range of motion: yes Tenderness to palpation: no Deformity: no Ecchymosis: no Tests positive: Neer's,  Emtpy can Tests negative: Hawkins, cross over, Speed's, lift off, Spurling's Neurologic: Normal sensory function. No focal deficits noted. DTR's equal and symmetry in UE's. No clonus. Psychiatric: Normal mood. Age appropriate judgment and insight. Alert & oriented x 3.    Assessment:  Pain in joint of right shoulder  Plan: Orders as above. F/u w sports medicine team as originally scheduled on 12-19-17. The patient voiced understanding and agreement to the plan.     Sharlene DoryWendling, Abdifatah Colquhoun Paul, [place] 12/16/17 1456

## 2017-12-16 NOTE — ED Notes (Signed)
Pt discharged by provider.

## 2017-12-16 NOTE — ED Triage Notes (Signed)
[facility] rt arm x8 days, pt was seen by physician and was referred to sports medicine but it has become painful, pt denies any injuries.

## 2017-12-18 DIAGNOSIS — L409 Psoriasis, unspecified: Secondary | ICD-10-CM | POA: Diagnosis not present

## 2017-12-18 DIAGNOSIS — Z79899 Other long term (current) drug therapy: Secondary | ICD-10-CM | POA: Diagnosis not present

## 2017-12-18 DIAGNOSIS — M2550 Pain in unspecified joint: Secondary | ICD-10-CM | POA: Diagnosis not present

## 2017-12-18 NOTE — Progress Notes (Signed)
[name] [address] [place], [place] [address] Phone: [phone] Subjective:    I'm seeing this patient by the request  of:  Corwin Levins, MD   CC: Arm pain  OZH:[id number]  Victoria Hale is a 67 y.o. female coming in with complaint of right arm pain. Pain in the posterior aspect of arm with radiating symptoms including numbness into the hand. Has been going on for 2 weeks. Was in the hospital with her brother and she stayed overnight in a chair and the pain started after that visit to the hospital.  Patient states symptoms and feels better when she rests the arm on her head.  Sometimes has noticed that it is hard to grip things.  Rates the severity of pain is 8 out of 10.  Has not tried anything specific for the pain.     Patient did have x-rays taken December 12, 2017.  Patient was found to have arthritic changes from C3-C6.  He had a very large osteophyte formation noted at these levels as well.  Past Medical History:  Diagnosis Date  . ALLERGIC RHINITIS 03/02/2007  . ALOPECIA TOTALIS 03/02/2007  . ANA POSITIVE 09/03/2009  . ANOMALY, CONGENITAL, KIDNEY NEC 03/02/2007  . COLONIC POLYPS, HX OF 03/02/2007  . Cough 10/09/2008  . DEGENERATION, LUMBAR/LUMBOSACRAL DISC 03/02/2007  . DERMATITIS, ATOPIC 03/02/2007  . DERMATITIS, SEBORRHEIC 12/04/2007  . DYSPNEA 03/01/2007  . GERD 03/02/2007  . Hidradenitis 03/05/2010  . HYPERTENSION, BENIGN 11/28/2005  . LACTOSE INTOLERANCE 03/02/2007  . NEPHROLITHIASIS, HX OF 03/02/2007  . OTITIS EXTERNA, CHRONIC NEC 03/02/2007  . OVERACTIVE BLADDER 09/03/2009  . Overweight(278.02) 03/02/2007  . PALPITATIONS, RECURRENT 03/02/2007  . PHOTOALLERGIC DERMATITIS 03/02/2007  . PSORIASIS 08/28/2008  . THALASSEMIA NEC 03/02/2007  . Unspecified anemia 10/09/2008   Past Surgical History:  Procedure Laterality Date  . APPENDECTOMY     ?  . CESAREAN SECTION    . exploratory abdominal surgury  1970's  . s/p BTL    . s/p C-section twice      Social History   Socioeconomic History  . Marital status: Married    Spouse name: Not on file  . Number of children: 2  . Years of education: Not on file  . Highest education level: Not on file  Occupational History  . 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 Financial resource strain: Not on file  . Food insecurity:    Worry: Not on file    Inability: Not on file  . Transportation needs:    Medical: Not on file    Non-medical: Not on file  Tobacco Use  . Smoking status: Former Smoker    Last attempt to quit: 07/27/1986    Years since quitting: 31.4  . Smokeless tobacco: Never Used  Substance and Sexual Activity  . Alcohol use: Yes    Alcohol/week: 0.6 oz    Types: 1 Glasses of wine per week    Comment: glass of wine weekend  . Drug use: No  . Sexual activity: Not Currently    Comment: 1st intercourse- 18, partners- less than 5  Lifestyle  . Physical activity:    Days per week: Not on file    Minutes per session: Not on file  . Stress: Not on file  Relationships  . Social connections:    Talks on phone: Not on file    Gets together: Not on file    Attends religious service:  Not on file    Active member of club or organization: Not on file    Attends meetings of clubs or organizations: Not on file    Relationship status: Not on file  Other Topics Concern  . Not on file  Social History Narrative  . Not on file   No Known Allergies Family History  Problem Relation Age of Onset  . Breast cancer Mother 1560       diagnosed  . Heart attack Father 8480  . Heart disease Sister 4848       had heart valve replaced     Past medical history, social, surgical and family history all reviewed in electronic medical record.  No pertanent information unless stated regarding to the chief complaint.   Review of Systems:Review of systems updated and as accurate as of 12/19/17  No headache, visual changes, nausea, vomiting, diarrhea, constipation,  dizziness, abdominal pain, skin rash, fevers, chills, night sweats, weight loss, swollen lymph nodes, body aches, joint swelling,, chest pain, shortness of breath, mood changes.  Positive muscle aches  Objective  Blood pressure 140/72, pulse 60, height 5\' 1"  (1.549 m), weight 146 lb (66.2 kg), SpO2 98 %. Systems examined below as of 12/19/17   General: No apparent distress alert and oriented x3 mood and affect normal, dressed appropriately.  HEENT: Pupils equal, extraocular movements intact  Respiratory: Patient's speak in full sentences and does not appear short of breath  Cardiovascular: No lower extremity edema, non tender, no erythema  Skin: Warm dry intact with no signs of infection or rash on extremities or on axial skeleton.  Abdomen: Soft nontender  Neuro: Cranial nerves II through XII are intact, neurovascularly intact in all extremities with 2+ pulses.  Lymph: No lymphadenopathy of posterior or anterior cervical chain or axillae bilaterally.  Gait normal with good balance and coordination.  MSK:  Non tender with full range of motion and good stability and symmetric strength and tone of shoulders, elbows, wrist, hip, knee and ankles bilaterally.  Mild arthritic changes of multiple joints  Neck: Inspection loss of lordosis. No palpable stepoffs. Positive Spurling's maneuver with radicular symptoms down the right arm. Limited range of motion in all planes especially with right-sided sidebending and extension of only 5 degrees Significant weakness in the C6-C8 distribution on the right side. No sensory change to C4 to T1 Negative Hoffman sign bilaterally Reflexes 1+ tricep on the right side compared to the left   97110; 15 additional minutes spent for Therapeutic exercises as stated in above notes.  This included exercises focusing on stretching, strengthening, with significant focus on eccentric aspects.   Long term goals include an improvement in range of motion, strength,  endurance as well as avoiding reinjury. Patient's frequency would include in 1-2 times a day, 3-5 times a week for a duration of 6-12 weeks. =Exercises that included:  Basic scapular stabilization to include adduction and depression of scapula Scaption, focusing on proper movement and good control Rows with theraband which was given   Proper technique shown and discussed handout in great detail with ATC.  All questions were discussed and answered.     Impression and Recommendations:     This case required medical decision making of moderate complexity.      Note: This dictation was prepared with Dragon dictation along with smaller phrase technology. Any transcriptional errors that result from this process are unintentional.

## 2017-12-19 ENCOUNTER — Ambulatory Visit: Payer: Medicare Other | Admitting: Family Medicine

## 2017-12-19 ENCOUNTER — Other Ambulatory Visit: Payer: Self-pay | Admitting: Family Medicine

## 2017-12-19 ENCOUNTER — Encounter: Payer: Self-pay | Admitting: Family Medicine

## 2017-12-19 DIAGNOSIS — M5030 Other cervical disc degeneration, unspecified cervical region: Secondary | ICD-10-CM | POA: Diagnosis not present

## 2017-12-19 MED ORDER — MELOXICAM 15 MG PO TABS: 15.0000 mg | ORAL_TABLET | Freq: Every day | ORAL | 0 refills | Status: DC

## 2017-12-19 MED ORDER — GABAPENTIN 100 MG PO CAPS: 200.0000 mg | ORAL_CAPSULE | Freq: Every day | ORAL | 3 refills | Status: DC

## 2017-12-19 NOTE — Patient Instructions (Signed)
Good to see you  Ice 20 minutes 2 times daily. Usually after activity and before bed. Gabapentin 200mg  at night  Meloxicam daily for 10 days then as needed Exercises 3 times a week.  See me again in 2 weeks to make sure you are improving.

## 2017-12-19 NOTE — Assessment & Plan Note (Signed)
Degenerative disc disease.  Patient does have some cervical radiculopathy that is concerning.  Started patient on gabapentin and warned of potential side effects.  Home exercises given.  Discussed possible formal physical therapy which patient declined.  Patient has meloxicam but wanted to avoid prednisone.  Patient does have some weakness and decreasing deep tendon reflexes with no improvement MRI could be a possibility.  Patient would be a candidate for epidurals.  Follow-up with me again in 2 to 4 weeks

## 2017-12-27 ENCOUNTER — Other Ambulatory Visit: Payer: Self-pay | Admitting: Family

## 2017-12-27 DIAGNOSIS — N644 Mastodynia: Secondary | ICD-10-CM

## 2018-01-03 ENCOUNTER — Ambulatory Visit: Payer: Medicare Other

## 2018-01-03 ENCOUNTER — Ambulatory Visit
Admission: RE | Admit: 2018-01-03 | Discharge: 2018-01-03 | Disposition: A | Discharge status: Home / Self Care | Payer: Medicare Other | Source: Ambulatory Visit | Attending: Family | Admitting: Family

## 2018-01-03 DIAGNOSIS — R928 Other abnormal and inconclusive findings on diagnostic imaging of breast: Secondary | ICD-10-CM | POA: Diagnosis not present

## 2018-01-03 DIAGNOSIS — N644 Mastodynia: Secondary | ICD-10-CM

## 2018-01-08 NOTE — Progress Notes (Deleted)
Victoria Hale D.O. [facility] [address] [place], [place] [address] Phone: [phone] Subjective:    I'm seeing this patient by the request  of:    CC:   BJY:[id number]:Subjective  Victoria HarbourMildred G Hale is a 67 y.o. female coming in with complaint of ***  Onset-  Location Duration-  Character- Aggravating factors- Reliving factors-  Therapies tried-  Severity-     Past Medical History:  Diagnosis Date  . ALLERGIC RHINITIS 03/02/2007  . ALOPECIA TOTALIS 03/02/2007  . ANA POSITIVE 09/03/2009  . ANOMALY, CONGENITAL, KIDNEY NEC 03/02/2007  . COLONIC POLYPS, HX OF 03/02/2007  . Cough 10/09/2008  . DEGENERATION, LUMBAR/LUMBOSACRAL DISC 03/02/2007  . DERMATITIS, ATOPIC 03/02/2007  . DERMATITIS, SEBORRHEIC 12/04/2007  . DYSPNEA 03/01/2007  . GERD 03/02/2007  . Hidradenitis 03/05/2010  . HYPERTENSION, BENIGN 11/28/2005  . LACTOSE INTOLERANCE 03/02/2007  . NEPHROLITHIASIS, HX OF 03/02/2007  . OTITIS EXTERNA, CHRONIC NEC 03/02/2007  . OVERACTIVE BLADDER 09/03/2009  . Overweight(278.02) 03/02/2007  . PALPITATIONS, RECURRENT 03/02/2007  . PHOTOALLERGIC DERMATITIS 03/02/2007  . PSORIASIS 08/28/2008  . THALASSEMIA NEC 03/02/2007  . Unspecified anemia 10/09/2008   Past Surgical History:  Procedure Laterality Date  . APPENDECTOMY     ?  . CESAREAN SECTION    . exploratory abdominal surgury  1970's  . s/p BTL    . s/p C-section twice     Social History   Socioeconomic History  . Marital status: Married    Spouse name: Not on file  . Number of children: 2  . Years of education: Not on file  . Highest education level: Not on file  Occupational History  . 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 Financial resource strain: Not on file  . Food insecurity:    Worry: Not on file    Inability: Not on file  . Transportation needs:    Medical: Not on file    Non-medical: Not on file  Tobacco Use  . Smoking status: Former Smoker    Last attempt to quit: 07/27/1986    Years  since quitting: 31.4  . Smokeless tobacco: Never Used  Substance and Sexual Activity  . Alcohol use: Yes    Alcohol/week: 1.0 standard drinks    Types: 1 Glasses of wine per week    Comment: glass of wine weekend  . Drug use: No  . Sexual activity: Not Currently    Comment: 1st intercourse- 18, partners- less than 5  Lifestyle  . Physical activity:    Days per week: Not on file    Minutes per session: Not on file  . Stress: Not on file  Relationships  . Social connections:    Talks on phone: Not on file    Gets together: Not on file    Attends religious service: Not on file    Active member of club or organization: Not on file    Attends meetings of clubs or organizations: Not on file    Relationship status: Not on file  Other Topics Concern  . Not on file  Social History Narrative  . Not on file   No Known Allergies Family History  Problem Relation Age of Onset  . Breast cancer Mother 7260       diagnosed  . Heart attack Father 7880  . Heart disease Sister 6248       had heart valve replaced     Past medical history, social, surgical and family history all reviewed  in electronic medical record.  No pertanent information unless stated regarding to the chief complaint.   Review of Systems:Review of systems updated and as accurate as of 01/08/18  No headache, visual changes, nausea, vomiting, diarrhea, constipation, dizziness, abdominal pain, skin rash, fevers, chills, night sweats, weight loss, swollen lymph nodes, body aches, joint swelling, muscle aches, chest pain, shortness of breath, mood changes.   Objective  There were no vitals taken for this visit. Systems examined below as of 01/08/18   General: No apparent distress alert and oriented x3 mood and affect normal, dressed appropriately.  HEENT: Pupils equal, extraocular movements intact  Respiratory: Patient's speak in full sentences and does not appear short of breath  Cardiovascular: No lower extremity edema, non  tender, no erythema  Skin: Warm dry intact with no signs of infection or rash on extremities or on axial skeleton.  Abdomen: Soft nontender  Neuro: Cranial nerves II through XII are intact, neurovascularly intact in all extremities with 2+ DTRs and 2+ pulses.  Lymph: No lymphadenopathy of posterior or anterior cervical chain or axillae bilaterally.  Gait normal with good balance and coordination.  MSK:  Non tender with full range of motion and good stability and symmetric strength and tone of shoulders, elbows, wrist, hip, knee and ankles bilaterally.     Impression and Recommendations:     This case required medical decision making of moderate complexity.      Note: This dictation was prepared with Dragon dictation along with smaller phrase technology. Any transcriptional errors that result from this process are unintentional.

## 2018-01-09 ENCOUNTER — Ambulatory Visit: Payer: Medicare Other | Admitting: Family Medicine

## 2018-01-30 DIAGNOSIS — L409 Psoriasis, unspecified: Secondary | ICD-10-CM | POA: Diagnosis not present

## 2018-01-30 DIAGNOSIS — M2550 Pain in unspecified joint: Secondary | ICD-10-CM | POA: Diagnosis not present

## 2018-05-14 DIAGNOSIS — L409 Psoriasis, unspecified: Secondary | ICD-10-CM | POA: Diagnosis not present

## 2018-05-26 ENCOUNTER — Other Ambulatory Visit: Payer: Self-pay | Admitting: Internal Medicine

## 2018-12-24 ENCOUNTER — Ambulatory Visit (INDEPENDENT_AMBULATORY_CARE_PROVIDER_SITE_OTHER): Payer: Medicare Other | Admitting: Internal Medicine

## 2018-12-24 ENCOUNTER — Other Ambulatory Visit: Payer: Self-pay

## 2018-12-24 ENCOUNTER — Encounter: Payer: Self-pay | Admitting: Internal Medicine

## 2018-12-24 ENCOUNTER — Telehealth: Payer: Self-pay | Admitting: Internal Medicine

## 2018-12-24 VITALS — BP 128/76 | HR 75 | Temp 98.3°F | Ht 61.0 in | Wt 154.0 lb

## 2018-12-24 DIAGNOSIS — J309 Allergic rhinitis, unspecified: Secondary | ICD-10-CM | POA: Diagnosis not present

## 2018-12-24 DIAGNOSIS — I10 Essential (primary) hypertension: Secondary | ICD-10-CM

## 2018-12-24 DIAGNOSIS — F17200 Nicotine dependence, unspecified, uncomplicated: Secondary | ICD-10-CM

## 2018-12-24 DIAGNOSIS — E538 Deficiency of other specified B group vitamins: Secondary | ICD-10-CM

## 2018-12-24 DIAGNOSIS — E611 Iron deficiency: Secondary | ICD-10-CM

## 2018-12-24 DIAGNOSIS — R0600 Dyspnea, unspecified: Secondary | ICD-10-CM

## 2018-12-24 DIAGNOSIS — R05 Cough: Secondary | ICD-10-CM | POA: Insufficient documentation

## 2018-12-24 DIAGNOSIS — D509 Iron deficiency anemia, unspecified: Secondary | ICD-10-CM

## 2018-12-24 DIAGNOSIS — R059 Cough, unspecified: Secondary | ICD-10-CM

## 2018-12-24 DIAGNOSIS — E559 Vitamin D deficiency, unspecified: Secondary | ICD-10-CM | POA: Diagnosis not present

## 2018-12-24 DIAGNOSIS — Z0001 Encounter for general adult medical examination with abnormal findings: Secondary | ICD-10-CM | POA: Diagnosis not present

## 2018-12-24 DIAGNOSIS — R739 Hyperglycemia, unspecified: Secondary | ICD-10-CM

## 2018-12-24 MED ORDER — ATORVASTATIN CALCIUM 10 MG PO TABS: 10.0000 mg | ORAL_TABLET | Freq: Every day | ORAL | 1 refills | Status: DC

## 2018-12-24 MED ORDER — ALBUTEROL SULFATE HFA 108 (90 BASE) MCG/ACT IN AERS: 2.0000 | INHALATION_SPRAY | Freq: Four times a day (QID) | RESPIRATORY_TRACT | 5 refills | Status: DC | PRN

## 2018-12-24 MED ORDER — AMLODIPINE BESYLATE 2.5 MG PO TABS: 2.5000 mg | ORAL_TABLET | Freq: Every day | ORAL | 1 refills | Status: DC

## 2018-12-24 NOTE — Assessment & Plan Note (Addendum)
For bnp with labs, but no overt volume overload on exam  In addition to the time spent performing CPE, I spent an additional 25 minutes face to face,in which greater than 50% of this time was spent in counseling and coordination of care for patient's acute illness as documented, including the differential dx, treatment, further evaluation and other management of dyspnea, cough, allergies, HTN, hyperglycemia , iron deficiency, smoker

## 2018-12-24 NOTE — Telephone Encounter (Signed)
Noted  

## 2018-12-24 NOTE — Telephone Encounter (Signed)
Medication Refill - Medication: atorvastatin (LIPITOR) 10 MG tablet & amLODipine (NORVASC) 2.5 MG tablet    Has the patient contacted their pharmacy? Yes.  Pt called stating pharmacy tried to get in contact with office with no response. Pt states she is out of one of the medications. Please advise.  (Agent: If no, request that the patient contact the pharmacy for the refill.) (Agent: If yes, when and what did the pharmacy advise?)  Preferred Pharmacy (with phone number or street name):  [facility] [place], [address]  [place] [address]  Phone: [phone] Fax: [phone]  Open 24 hours     Agent: Please be advised that RX refills may take up to 3 business days. We ask that you follow-up with your pharmacy.

## 2018-12-24 NOTE — Assessment & Plan Note (Signed)
For allegra prn,  to f/u any worsening symptoms or concerns  

## 2018-12-24 NOTE — Progress Notes (Signed)
Subjective:    Patient ID: Victoria Hale, female    DOB: [birth year], 68 y.o.   MRN: [id number]  HPI  Here for wellness and f/u;  Overall doing ok.  Pt denies neurological change such as new headache, facial or extremity weakness.  Pt denies polydipsia, polyuria, or low sugar symptoms. Pt states overall good compliance with treatment and medications, good tolerability, and has been trying to follow appropriate diet.  Pt denies worsening depressive symptoms, suicidal ideation or panic. No fever, night sweats, wt loss, loss of appetite, or other constitutional symptoms.  Pt states good ability with ADL's, has low fall risk, home safety reviewed and adequate, no other significant changes in hearing or vision, and only occasionally active with exercise. Gained 8 lbs with increased calories with sister staying with her the lat 6 mo.   Wt Readings from Last 3 Encounters:  12/24/18 154 lb (69.9 kg)  12/19/17 146 lb (66.2 kg)  12/12/17 146 lb 1.6 oz (66.3 kg)  Pt denies chest pain, orthopnea, PND, increased LE swelling, palpitations, dizziness or syncope, but the first time had the onset in the past 6 mo of intermittent sob/doe with scan prod cough whitish sputum, without fever, mild intermittent, nothing seems to make better or worse.  Still smoking at least 1 cig per day, sometime 4-5.  Not ready to quit.  Does have several wks ongoing nasal allergy symptoms with clearish congestion, itch and sneezing, without fever, pain, ST, cough, swelling or wheezing. Past Medical History:  Diagnosis Date  . ALLERGIC RHINITIS 03/02/2007  . ALOPECIA TOTALIS 03/02/2007  . ANA POSITIVE 09/03/2009  . ANOMALY, CONGENITAL, KIDNEY NEC 03/02/2007  . COLONIC POLYPS, HX OF 03/02/2007  . Cough 10/09/2008  . DEGENERATION, LUMBAR/LUMBOSACRAL DISC 03/02/2007  . DERMATITIS, ATOPIC 03/02/2007  . DERMATITIS, SEBORRHEIC 12/04/2007  . DYSPNEA 03/01/2007  . GERD 03/02/2007  . Hidradenitis 03/05/2010  . HYPERTENSION, BENIGN 11/28/2005  .  LACTOSE INTOLERANCE 03/02/2007  . NEPHROLITHIASIS, HX OF 03/02/2007  . OTITIS EXTERNA, CHRONIC NEC 03/02/2007  . OVERACTIVE BLADDER 09/03/2009  . Overweight(278.02) 03/02/2007  . PALPITATIONS, RECURRENT 03/02/2007  . PHOTOALLERGIC DERMATITIS 03/02/2007  . PSORIASIS 08/28/2008  . THALASSEMIA NEC 03/02/2007  . Unspecified anemia 10/09/2008   Past Surgical History:  Procedure Laterality Date  . APPENDECTOMY     ?  . CESAREAN SECTION    . exploratory abdominal surgury  1970's  . s/p BTL    . s/p C-section twice      reports that she quit smoking about 32 years ago. She has never used smokeless tobacco. She reports current alcohol use of about 1.0 standard drinks of alcohol per week. She reports that she does not use drugs. family history includes Breast cancer (age of onset: 54) in her mother; Heart attack (age of onset: 78) in her father; Heart disease (age of onset: 86) in her sister. No Known Allergies Current Outpatient Medications on File Prior to Visit  Medication Sig Dispense Refill  . aspirin EC 81 MG tablet Take 1 tablet (81 mg total) by mouth daily. 90 tablet 11  . doxycycline (VIBRA-TABS) 100 MG tablet Take 1 tablet (100 mg total) by mouth 2 (two) times daily. 20 tablet 0  . folic acid (FOLVITE) 1 MG tablet Take 1 mg by mouth daily.    Marland Kitchen gabapentin (NEURONTIN) 100 MG capsule Take 2 capsules (200 mg total) by mouth at bedtime. 60 capsule 3  . meloxicam (MOBIC) 15 MG tablet TAKE 1 TABLET(15 MG) BY MOUTH DAILY  90 tablet 0   No current facility-administered medications on file prior to visit.    Review of Systems Constitutional: Negative for other unusual diaphoresis, sweats, appetite or weight changes HENT: Negative for other worsening hearing loss, ear pain, facial swelling, mouth sores or neck stiffness.   Eyes: Negative for other worsening pain, redness or other visual disturbance.  Respiratory: Negative for other stridor or swelling Cardiovascular: Negative for other palpitations or  other chest pain  Gastrointestinal: Negative for worsening diarrhea or loose stools, blood in stool, distention or other pain Genitourinary: Negative for hematuria, flank pain or other change in urine volume.  Musculoskeletal: Negative for myalgias or other joint swelling.  Skin: Negative for other color change, or other wound or worsening drainage.  Neurological: Negative for other syncope or numbness. Hematological: Negative for other adenopathy or swelling Psychiatric/Behavioral: Negative for hallucinations, other worsening agitation, SI, self-injury, or new decreased concentration All other system neg per pt    Objective:   Physical Exam BP 128/76   Pulse 75   Temp 98.3 F (36.8 C) (Oral)   Ht 5\' 1"  (1.549 m)   Wt 154 lb (69.9 kg)   SpO2 97%   BMI 29.10 kg/m  VS noted,  Constitutional: Pt is oriented to person, place, and time. Appears well-developed and well-nourished, in no significant distress and comfortable Head: Normocephalic and atraumatic  Eyes: Conjunctivae and EOM are normal. Pupils are equal, round, and reactive to light Right Ear: External ear normal without discharge Left Ear: External ear normal without discharge Nose: Nose without discharge or deformity Mouth/Throat: Oropharynx is without other ulcerations and moist  Neck: Normal range of motion. Neck supple. No JVD present. No tracheal deviation present or significant neck LA or mass Cardiovascular: Normal rate, regular rhythm, normal heart sounds and intact distal pulses.   Pulmonary/Chest: WOB normal and breath sounds decreased without rales or wheezing  Abdominal: Soft. Bowel sounds are normal. NT. No HSM  Musculoskeletal: Normal range of motion. Exhibits no edema Lymphadenopathy: Has no other cervical adenopathy.  Neurological: Pt is alert and oriented to person, place, and time. Pt has normal reflexes. No cranial nerve deficit. Motor grossly intact, Gait intact Skin: Skin is warm and dry. No rash noted or  new ulcerations Psychiatric:  Has normal mood and affect. Behavior is normal without agitation No other exam findings Lab Results  Component Value Date   WBC 6.5 11/02/2017   HGB 12.7 11/02/2017   HCT 40.7 11/02/2017   PLT 173.0 11/02/2017   GLUCOSE 106 (H) 11/02/2017   CHOL 143 11/02/2017   TRIG 98.0 11/02/2017   HDL 44.90 11/02/2017   LDLCALC 78 11/02/2017   ALT 10 11/02/2017   AST 14 11/02/2017   NA 140 11/02/2017   K 4.3 11/02/2017   CL 106 11/02/2017   CREATININE 0.72 11/02/2017   BUN 10 11/02/2017   CO2 30 11/02/2017   TSH 1.21 11/02/2017   HGBA1C 6.5 10/02/2015      Assessment & Plan:

## 2018-12-24 NOTE — Assessment & Plan Note (Signed)
stable overall by history and exam, recent data reviewed with pt, and pt to continue medical treatment as before,  to f/u any worsening symptoms or concerns  

## 2018-12-24 NOTE — Assessment & Plan Note (Signed)

## 2018-12-24 NOTE — Assessment & Plan Note (Signed)
Also for iron panel f/u

## 2018-12-24 NOTE — Assessment & Plan Note (Addendum)
Ok also for [employer] as needed for allergies, also cxr and bnp for dyspnea

## 2018-12-24 NOTE — Patient Instructions (Signed)
Please take all new medication as prescribed - the inhaler as needed for wheezing and shortness of breath  Please stop smoking  Please continue all other medications as before, and refills have been done if requested.  Please have the pharmacy call with any other refills you may need.  Please continue your efforts at being more active, low cholesterol diet, and weight control.  You are otherwise up to date with prevention measures today.  Please keep your appointments with your specialists as you may have planned  Please go to the XRAY Department in the Basement (go straight as you get off the elevator) for the x-ray testing  Please go to the LAB in the Basement (turn left off the elevator) for the tests to be done today  You will be contacted by phone if any changes need to be made immediately.  Otherwise, you will receive a letter about your results with an explanation, but please check with MyChart first.  Please remember to sign up for MyChart if you have not done so, as this will be important to you in the future with finding out test results, communicating by private email, and scheduling acute appointments online when needed.  Please return in 6 months, or sooner if needed

## 2018-12-24 NOTE — Telephone Encounter (Signed)
Appointment scheduled for today 

## 2018-12-24 NOTE — Assessment & Plan Note (Signed)
Urged to quit, pt not ready 

## 2018-12-25 ENCOUNTER — Ambulatory Visit (INDEPENDENT_AMBULATORY_CARE_PROVIDER_SITE_OTHER)
Admission: RE | Admit: 2018-12-25 | Discharge: 2018-12-25 | Disposition: A | Discharge status: Home / Self Care | Payer: Medicare Other | Source: Ambulatory Visit | Attending: Internal Medicine | Admitting: Internal Medicine

## 2018-12-25 ENCOUNTER — Other Ambulatory Visit (INDEPENDENT_AMBULATORY_CARE_PROVIDER_SITE_OTHER): Payer: Medicare Other

## 2018-12-25 ENCOUNTER — Telehealth: Payer: Self-pay | Admitting: Internal Medicine

## 2018-12-25 DIAGNOSIS — R0600 Dyspnea, unspecified: Secondary | ICD-10-CM

## 2018-12-25 DIAGNOSIS — E538 Deficiency of other specified B group vitamins: Secondary | ICD-10-CM

## 2018-12-25 DIAGNOSIS — R0602 Shortness of breath: Secondary | ICD-10-CM | POA: Diagnosis not present

## 2018-12-25 DIAGNOSIS — R05 Cough: Secondary | ICD-10-CM | POA: Diagnosis not present

## 2018-12-25 DIAGNOSIS — E559 Vitamin D deficiency, unspecified: Secondary | ICD-10-CM

## 2018-12-25 DIAGNOSIS — R739 Hyperglycemia, unspecified: Secondary | ICD-10-CM

## 2018-12-25 DIAGNOSIS — E611 Iron deficiency: Secondary | ICD-10-CM

## 2018-12-25 DIAGNOSIS — Z0001 Encounter for general adult medical examination with abnormal findings: Secondary | ICD-10-CM

## 2018-12-25 LAB — CBC WITH DIFFERENTIAL/PLATELET
Basophils Absolute: 0 10*3/uL (ref 0.0–0.1)
Basophils Relative: 0.8 % (ref 0.0–3.0)
Eosinophils Absolute: 0.1 10*3/uL (ref 0.0–0.7)
Eosinophils Relative: 2.1 % (ref 0.0–5.0)
HCT: 41 % (ref 36.0–46.0)
Hemoglobin: 12.9 g/dL (ref 12.0–15.0)
Lymphocytes Relative: 33 % (ref 12.0–46.0)
Lymphs Abs: 2 10*3/uL (ref 0.7–4.0)
MCHC: 31.5 g/dL (ref 30.0–36.0)
MCV: 68.4 fl — ABNORMAL LOW (ref 78.0–100.0)
Monocytes Absolute: 0.3 10*3/uL (ref 0.1–1.0)
Monocytes Relative: 5.7 % (ref 3.0–12.0)
Neutro Abs: 3.5 10*3/uL (ref 1.4–7.7)
Neutrophils Relative %: 58.4 % (ref 43.0–77.0)
Platelets: 173 10*3/uL (ref 150.0–400.0)
RBC: 5.99 Mil/uL — ABNORMAL HIGH (ref 3.87–5.11)
RDW: 15.5 % (ref 11.5–15.5)
WBC: 6 10*3/uL (ref 4.0–10.5)

## 2018-12-25 LAB — LIPID PANEL
Cholesterol: 131 mg/dL (ref 0–200)
HDL: 44.2 mg/dL (ref >39.00)
LDL Cholesterol: 69 mg/dL (ref 0–99)
NonHDL: 86.51
Total CHOL/HDL Ratio: 3
Triglycerides: 89 mg/dL (ref 0.0–149.0)
VLDL: 17.8 mg/dL (ref 0.0–40.0)

## 2018-12-25 LAB — URINALYSIS, ROUTINE W REFLEX MICROSCOPIC
Bilirubin Urine: NEGATIVE
Hgb urine dipstick: NEGATIVE
Ketones, ur: NEGATIVE
Leukocytes,Ua: NEGATIVE
Nitrite: NEGATIVE
Specific Gravity, Urine: 1.025 (ref 1.000–1.030)
Urine Glucose: NEGATIVE
Urobilinogen, UA: 0.2 (ref 0.0–1.0)
WBC, UA: NONE SEEN (ref 0–?)
pH: 6 (ref 5.0–8.0)

## 2018-12-25 LAB — HEPATIC FUNCTION PANEL
ALT: 9 U/L (ref 0–35)
AST: 14 U/L (ref 0–37)
Albumin: 4 g/dL (ref 3.5–5.2)
Alkaline Phosphatase: 107 U/L (ref 39–117)
Bilirubin, Direct: 0.1 mg/dL (ref 0.0–0.3)
Total Bilirubin: 0.7 mg/dL (ref 0.2–1.2)
Total Protein: 7.8 g/dL (ref 6.0–8.3)

## 2018-12-25 LAB — BASIC METABOLIC PANEL
BUN: 12 mg/dL (ref 6–23)
CO2: 30 mEq/L (ref 19–32)
Calcium: 9.4 mg/dL (ref 8.4–10.5)
Chloride: 105 mEq/L (ref 96–112)
Creatinine, Ser: 0.73 mg/dL (ref 0.40–1.20)
GFR: 95.78 mL/min (ref >60.00)
Glucose, Bld: 99 mg/dL (ref 70–99)
Potassium: 4.2 mEq/L (ref 3.5–5.1)
Sodium: 141 mEq/L (ref 135–145)

## 2018-12-25 LAB — IBC PANEL
Iron: 76 ug/dL (ref 42–145)
Saturation Ratios: 27.8 % (ref 20.0–50.0)
Transferrin: 195 mg/dL — ABNORMAL LOW (ref 212.0–360.0)

## 2018-12-25 LAB — TSH: TSH: 1.4 u[IU]/mL (ref 0.35–4.50)

## 2018-12-25 LAB — VITAMIN D 25 HYDROXY (VIT D DEFICIENCY, FRACTURES): VITD: 28.78 ng/mL — ABNORMAL LOW (ref 30.00–100.00)

## 2018-12-25 LAB — VITAMIN B12: Vitamin B-12: 1050 pg/mL — ABNORMAL HIGH (ref 211–911)

## 2018-12-25 LAB — HEMOGLOBIN A1C: Hgb A1c MFr Bld: 6.2 % (ref 4.6–6.5)

## 2018-12-25 MED ORDER — VITAMIN D (ERGOCALCIFEROL) 1.25 MG (50000 UNIT) PO CAPS: 50000.0000 [IU] | ORAL_CAPSULE | ORAL | 0 refills | Status: DC

## 2018-12-25 NOTE — Telephone Encounter (Signed)
No notes

## 2019-06-18 ENCOUNTER — Other Ambulatory Visit: Payer: Self-pay | Admitting: Internal Medicine

## 2019-06-26 ENCOUNTER — Ambulatory Visit (INDEPENDENT_AMBULATORY_CARE_PROVIDER_SITE_OTHER): Payer: Medicare Other | Admitting: Internal Medicine

## 2019-06-26 ENCOUNTER — Encounter: Payer: Self-pay | Admitting: Internal Medicine

## 2019-06-26 ENCOUNTER — Other Ambulatory Visit: Payer: Self-pay

## 2019-06-26 VITALS — BP 118/70 | HR 60 | Temp 98.9°F | Ht 61.0 in | Wt 157.2 lb

## 2019-06-26 DIAGNOSIS — R739 Hyperglycemia, unspecified: Secondary | ICD-10-CM

## 2019-06-26 DIAGNOSIS — E782 Mixed hyperlipidemia: Secondary | ICD-10-CM | POA: Diagnosis not present

## 2019-06-26 DIAGNOSIS — I10 Essential (primary) hypertension: Secondary | ICD-10-CM | POA: Diagnosis not present

## 2019-06-26 DIAGNOSIS — Z0000 Encounter for general adult medical examination without abnormal findings: Secondary | ICD-10-CM

## 2019-06-26 DIAGNOSIS — E559 Vitamin D deficiency, unspecified: Secondary | ICD-10-CM | POA: Diagnosis not present

## 2019-06-26 MED ORDER — ATORVASTATIN CALCIUM 10 MG PO TABS: ORAL_TABLET | ORAL | 3 refills | Status: DC

## 2019-06-26 MED ORDER — AMLODIPINE BESYLATE 2.5 MG PO TABS: ORAL_TABLET | ORAL | 3 refills | Status: DC

## 2019-06-26 NOTE — Progress Notes (Signed)
Subjective:    Patient ID: Victoria Hale, female    DOB: 1950-08-07, 69 y.o.   MRN: [id number]  HPI  Here to f/u; overall doing ok,  Pt denies chest pain, increasing sob or doe, wheezing, orthopnea, PND, increased LE swelling, palpitations, dizziness or syncope.  Pt denies new neurological symptoms such as new headache, or facial or extremity weakness or numbness.  Pt denies polydipsia, polyuria, or low sugar episode.  Pt states overall good compliance with meds, mostly trying to follow appropriate diet, with wt overall stable,  but little exercise however. Taking Vit d daily Past Medical History:  Diagnosis Date  . ALLERGIC RHINITIS 03/02/2007  . ALOPECIA TOTALIS 03/02/2007  . ANA POSITIVE 09/03/2009  . ANOMALY, CONGENITAL, KIDNEY NEC 03/02/2007  . COLONIC POLYPS, HX OF 03/02/2007  . Cough 10/09/2008  . DEGENERATION, LUMBAR/LUMBOSACRAL DISC 03/02/2007  . DERMATITIS, ATOPIC 03/02/2007  . DERMATITIS, SEBORRHEIC 12/04/2007  . DYSPNEA 03/01/2007  . GERD 03/02/2007  . Hidradenitis 03/05/2010  . HYPERTENSION, BENIGN 11/28/2005  . LACTOSE INTOLERANCE 03/02/2007  . NEPHROLITHIASIS, HX OF 03/02/2007  . OTITIS EXTERNA, CHRONIC NEC 03/02/2007  . OVERACTIVE BLADDER 09/03/2009  . Overweight(278.02) 03/02/2007  . PALPITATIONS, RECURRENT 03/02/2007  . PHOTOALLERGIC DERMATITIS 03/02/2007  . PSORIASIS 08/28/2008  . THALASSEMIA NEC 03/02/2007  . Unspecified anemia 10/09/2008   Past Surgical History:  Procedure Laterality Date  . APPENDECTOMY     ?  . CESAREAN SECTION    . exploratory abdominal surgury  1970's  . s/p BTL    . s/p C-section twice      reports that she quit smoking about 32 years ago. She has never used smokeless tobacco. She reports current alcohol use of about 1.0 standard drinks of alcohol per week. She reports that she does not use drugs. family history includes Breast cancer (age of onset: 94) in her mother; Heart attack (age of onset: 19) in her father; Heart disease (age of onset: 71) in her  sister. No Known Allergies Current Outpatient Medications on File Prior to Visit  Medication Sig Dispense Refill  . aspirin EC 81 MG tablet Take 1 tablet (81 mg total) by mouth daily. 90 tablet 11  . Vitamin D, Ergocalciferol, (DRISDOL) 1.25 MG (50000 UT) CAPS capsule Take 1 capsule (50,000 Units total) by mouth every 7 (seven) days. 12 capsule 0   No current facility-administered medications on file prior to visit.   Review of Systems All otherwise neg per pt     Objective:   Physical Exam BP 118/70   Pulse 60   Temp 98.9 F (37.2 C)   Ht 5\' 1"  (1.549 m)   Wt 157 lb 3.2 oz (71.3 kg)   SpO2 98%   BMI 29.70 kg/m  VS noted,  Constitutional: Pt appears in NAD HENT: Head: NCAT.  Right Ear: External ear normal.  Left Ear: External ear normal.  Eyes: . Pupils are equal, round, and reactive to light. Conjunctivae and EOM are normal Nose: without d/c or deformity Neck: Neck supple. Gross normal ROM Cardiovascular: Normal rate and regular rhythm.   Pulmonary/Chest: Effort normal and breath sounds without rales or wheezing.  Abd:  Soft, NT, ND, + BS, no organomegaly Neurological: Pt is alert. At baseline orientation, motor grossly intact Skin: Skin is warm. No rashes, other new lesions, no LE edema Psychiatric: Pt behavior is normal without agitation  All otherwise neg per pt Lab Results  Component Value Date   WBC 6.0 12/25/2018   HGB 12.9 12/25/2018  HCT 41.0 12/25/2018   PLT 173.0 12/25/2018   GLUCOSE 99 12/25/2018   CHOL 131 12/25/2018   TRIG 89.0 12/25/2018   HDL 44.20 12/25/2018   LDLCALC 69 12/25/2018   ALT 9 12/25/2018   AST 14 12/25/2018   NA 141 12/25/2018   K 4.2 12/25/2018   CL 105 12/25/2018   CREATININE 0.73 12/25/2018   BUN 12 12/25/2018   CO2 30 12/25/2018   TSH 1.40 12/25/2018   HGBA1C 6.2 12/25/2018           Assessment & Plan:

## 2019-06-26 NOTE — Assessment & Plan Note (Signed)
stable overall by history and exam, recent data reviewed with pt, and pt to continue medical treatment as before,  to f/u any worsening symptoms or concerns  

## 2019-06-26 NOTE — Patient Instructions (Signed)

## 2019-06-26 NOTE — Assessment & Plan Note (Addendum)
stable overall by history and exam, recent data reviewed with pt, and pt to continue medical treatment as before,  to f/u any worsening symptoms or concerns  I spent 31 minutes preparing to see the patient by review of recent labs, imaging and procedures, obtaining and reviewing separately obtained history, communicating with the patient and family or caregiver, ordering medications, tests or procedures, and documenting clinical information in the EHR including the differential Dx, treatment, and any further evaluation and other management of HTN, HLD, hyperglycemia, Vit D deficiency

## 2019-07-15 ENCOUNTER — Ambulatory Visit: Payer: Medicare Other | Attending: Internal Medicine

## 2019-07-15 DIAGNOSIS — Z23 Encounter for immunization: Secondary | ICD-10-CM | POA: Insufficient documentation

## 2019-07-15 NOTE — Progress Notes (Signed)
   Covid-19 Vaccination Clinic  Name:  Victoria Hale    MRN: [id number] DOB: 27-Apr-1951  07/15/2019  Victoria Hale was observed post Covid-19 immunization for 15 minutes without incidence. She was provided with Vaccine Information Sheet and instruction to access the V-Safe system.   Victoria Hale was instructed to call 911 with any severe reactions post vaccine: Marland Kitchen Difficulty breathing  . Swelling of your face and throat  . A fast heartbeat  . A bad rash all over your body  . Dizziness and weakness    Immunizations Administered    Name Date Dose VIS Date Route   Pfizer COVID-19 Vaccine 07/15/2019  2:59 PM 0.3 mL 05/10/2019 Intramuscular   Manufacturer: [employer], [employer]   Lot: [id number]   NDC: [address]-0

## 2019-08-06 ENCOUNTER — Ambulatory Visit: Payer: Medicare Other | Attending: Internal Medicine

## 2019-08-06 DIAGNOSIS — Z23 Encounter for immunization: Secondary | ICD-10-CM

## 2019-08-06 NOTE — Progress Notes (Signed)
   Covid-19 Vaccination Clinic  Name:  Victoria Hale    MRN: [id number] DOB: 14-Jul-1950  08/06/2019  [name] was observed post Covid-19 immunization for 15 minutes without incident. She was provided with Vaccine Information Sheet and instruction to access the V-Safe system.   [name] was instructed to call 911 with any severe reactions post vaccine: Marland Kitchen Difficulty breathing  . Swelling of face and throat  . A fast heartbeat  . A bad rash all over body  . Dizziness and weakness   Immunizations Administered    Name Date Dose VIS Date Route   Pfizer COVID-19 Vaccine 08/06/2019 11:27 AM 0.3 mL 05/10/2019 Intramuscular   Manufacturer: [employer], [employer]   Lot: [id number]   NDC: [address]-6

## 2019-08-07 ENCOUNTER — Ambulatory Visit: Payer: Medicare Other

## 2019-09-10 ENCOUNTER — Other Ambulatory Visit: Payer: Self-pay | Admitting: Internal Medicine

## 2019-09-10 DIAGNOSIS — Z1231 Encounter for screening mammogram for malignant neoplasm of breast: Secondary | ICD-10-CM

## 2019-10-10 ENCOUNTER — Other Ambulatory Visit: Payer: Self-pay | Admitting: Internal Medicine

## 2019-10-10 NOTE — Telephone Encounter (Signed)
Refill was sent in, in January 2021.   LVM for pt informing of same.

## 2019-10-10 NOTE — Telephone Encounter (Signed)
    1.Medication Requested: amLODipine (NORVASC) 2.5 MG tablet  2. Pharmacy (Name, Street, City):[facility] [phone] - [place], [place] - [address] AT [facility] OF [address]  3. On Med List: yes  4. Last Visit with PCP: 06/26/19  5. Next visit date with PCP: 12/26/19   Agent: Please be advised that RX refills may take up to 3 business days. We ask that you follow-up with your pharmacy.

## 2019-10-25 DIAGNOSIS — L400 Psoriasis vulgaris: Secondary | ICD-10-CM | POA: Diagnosis not present

## 2019-12-19 ENCOUNTER — Other Ambulatory Visit: Payer: Medicare Other

## 2019-12-19 ENCOUNTER — Other Ambulatory Visit: Payer: Self-pay

## 2019-12-19 DIAGNOSIS — E559 Vitamin D deficiency, unspecified: Secondary | ICD-10-CM | POA: Diagnosis not present

## 2019-12-19 DIAGNOSIS — R739 Hyperglycemia, unspecified: Secondary | ICD-10-CM

## 2019-12-19 DIAGNOSIS — Z0000 Encounter for general adult medical examination without abnormal findings: Secondary | ICD-10-CM | POA: Diagnosis not present

## 2019-12-19 NOTE — Addendum Note (Signed)
Addended by: Merrilyn Puma on: 12/19/2019 10:44 AM   Modules accepted: Orders

## 2019-12-19 NOTE — Addendum Note (Signed)
Addended by: Diyana Starrett N on: 12/19/2019 10:45 AM   Modules accepted: Orders  

## 2019-12-19 NOTE — Addendum Note (Signed)
Addended by: Rumaisa Schnetzer N on: 12/19/2019 10:45 AM   Modules accepted: Orders  

## 2019-12-19 NOTE — Addendum Note (Signed)
Addended by: [facility] N on: 12/19/2019 10:45 AM   Modules accepted: Orders  

## 2019-12-19 NOTE — Addendum Note (Signed)
Addended by: Admiral Marcucci N on: 12/19/2019 10:45 AM   Modules accepted: Orders  

## 2019-12-19 NOTE — Addendum Note (Signed)
Addended by: Merrilyn Puma on: 12/19/2019 10:45 AM   Modules accepted: Orders

## 2019-12-19 NOTE — Addendum Note (Signed)
Addended by: Maysen Bonsignore N on: 12/19/2019 10:44 AM   Modules accepted: Orders  

## 2019-12-19 NOTE — Addendum Note (Signed)
Addended by: Amarion Portell N on: 12/19/2019 10:45 AM   Modules accepted: Orders  

## 2019-12-20 LAB — URINALYSIS, ROUTINE W REFLEX MICROSCOPIC
Bilirubin Urine: NEGATIVE
Glucose, UA: NEGATIVE
Hgb urine dipstick: NEGATIVE
Ketones, ur: NEGATIVE
Leukocytes,Ua: NEGATIVE
Nitrite: NEGATIVE
Protein, ur: NEGATIVE
Specific Gravity, Urine: 1.011 (ref 1.001–1.03)
pH: 7 (ref 5.0–8.0)

## 2019-12-20 LAB — BASIC METABOLIC PANEL
BUN: 10 mg/dL (ref 7–25)
CO2: 32 mmol/L (ref 20–32)
Calcium: 9.3 mg/dL (ref 8.6–10.4)
Chloride: 103 mmol/L (ref 98–110)
Creat: 0.73 mg/dL (ref 0.50–0.99)
Glucose, Bld: 92 mg/dL (ref 65–99)
Potassium: 4.2 mmol/L (ref 3.5–5.3)
Sodium: 140 mmol/L (ref 135–146)

## 2019-12-20 LAB — CBC WITH DIFFERENTIAL/PLATELET
Absolute Monocytes: 407 cells/uL (ref 200–950)
Basophils Absolute: 11 cells/uL (ref 0–200)
Basophils Relative: 0.2 %
Eosinophils Absolute: 160 cells/uL (ref 15–500)
Eosinophils Relative: 2.9 %
HCT: 41.7 % (ref 35.0–45.0)
Hemoglobin: 12.7 g/dL (ref 11.7–15.5)
Lymphs Abs: 2024 cells/uL (ref 850–3900)
MCH: 21.9 pg — ABNORMAL LOW (ref 27.0–33.0)
MCHC: 30.5 g/dL — ABNORMAL LOW (ref 32.0–36.0)
MCV: 71.9 fL — ABNORMAL LOW (ref 80.0–100.0)
Monocytes Relative: 7.4 %
Neutro Abs: 2899 cells/uL (ref 1500–7800)
Neutrophils Relative %: 52.7 %
Platelets: 166 10*3/uL (ref 140–400)
RBC: 5.8 10*6/uL — ABNORMAL HIGH (ref 3.80–5.10)
RDW: 15.9 % — ABNORMAL HIGH (ref 11.0–15.0)
Total Lymphocyte: 36.8 %
WBC: 5.5 10*3/uL (ref 3.8–10.8)

## 2019-12-20 LAB — LIPID PANEL
Cholesterol: 158 mg/dL (ref <200)
HDL: 57 mg/dL (ref >=50)
LDL Cholesterol (Calc): 78 mg/dL (calc)
Non-HDL Cholesterol (Calc): 101 mg/dL (calc) (ref <130)
Total CHOL/HDL Ratio: 2.8 (calc) (ref <5.0)
Triglycerides: 131 mg/dL (ref <150)

## 2019-12-20 LAB — HEPATIC FUNCTION PANEL
AG Ratio: 1.3 (calc) (ref 1.0–2.5)
ALT: 12 U/L (ref 6–29)
AST: 17 U/L (ref 10–35)
Albumin: 4.1 g/dL (ref 3.6–5.1)
Alkaline phosphatase (APISO): 90 U/L (ref 37–153)
Bilirubin, Direct: 0.2 mg/dL (ref 0.0–0.2)
Globulin: 3.2 g/dL (calc) (ref 1.9–3.7)
Indirect Bilirubin: 0.6 mg/dL (calc) (ref 0.2–1.2)
Total Bilirubin: 0.8 mg/dL (ref 0.2–1.2)
Total Protein: 7.3 g/dL (ref 6.1–8.1)

## 2019-12-20 LAB — HEMOGLOBIN A1C
Hgb A1c MFr Bld: 5.8 % of total Hgb — ABNORMAL HIGH (ref <5.7)
Mean Plasma Glucose: 120 (calc)
eAG (mmol/L): 6.6 (calc)

## 2019-12-20 LAB — VITAMIN D 25 HYDROXY (VIT D DEFICIENCY, FRACTURES): Vit D, 25-Hydroxy: 28 ng/mL — ABNORMAL LOW (ref 30–100)

## 2019-12-20 LAB — TSH: TSH: 1.12 mIU/L (ref 0.40–4.50)

## 2019-12-23 ENCOUNTER — Other Ambulatory Visit: Payer: Medicare Other

## 2019-12-26 ENCOUNTER — Encounter: Payer: Self-pay | Admitting: Internal Medicine

## 2019-12-26 ENCOUNTER — Other Ambulatory Visit: Payer: Self-pay

## 2019-12-26 ENCOUNTER — Ambulatory Visit (INDEPENDENT_AMBULATORY_CARE_PROVIDER_SITE_OTHER): Payer: Medicare Other | Admitting: Internal Medicine

## 2019-12-26 VITALS — BP 122/70 | HR 62 | Temp 98.3°F | Ht 61.0 in | Wt 149.0 lb

## 2019-12-26 DIAGNOSIS — I10 Essential (primary) hypertension: Secondary | ICD-10-CM

## 2019-12-26 DIAGNOSIS — L408 Other psoriasis: Secondary | ICD-10-CM

## 2019-12-26 DIAGNOSIS — Z0000 Encounter for general adult medical examination without abnormal findings: Secondary | ICD-10-CM | POA: Diagnosis not present

## 2019-12-26 DIAGNOSIS — E559 Vitamin D deficiency, unspecified: Secondary | ICD-10-CM

## 2019-12-26 DIAGNOSIS — Z0001 Encounter for general adult medical examination with abnormal findings: Secondary | ICD-10-CM

## 2019-12-26 DIAGNOSIS — M542 Cervicalgia: Secondary | ICD-10-CM

## 2019-12-26 DIAGNOSIS — N318 Other neuromuscular dysfunction of bladder: Secondary | ICD-10-CM

## 2019-12-26 MED ORDER — VITAMIN D (ERGOCALCIFEROL) 1.25 MG (50000 UNIT) PO CAPS: 50000.0000 [IU] | ORAL_CAPSULE | ORAL | 0 refills | Status: DC

## 2019-12-26 NOTE — Patient Instructions (Addendum)
Please take Vitamin D 97673 units weekly for 12 weeks, then plan to change to OTC Vitamin D3 at 2000 units per day, indefinitely.  You will be contacted regarding the referral for: colonoscopy  Please make sure to continue the cream you have and follow up with dermatology  Please continue all other medications as before, and refills have been done if requested.  Please have the pharmacy call with any other refills you may need.  Please continue your efforts at being more active, low cholesterol diet, and weight control.  You are otherwise up to date with prevention measures today.  Please keep your appointments with your specialists as you may have planned  Please make an Appointment to return for your 1 year visit, or sooner if needed, with Lab testing by Appointment as well, to be done about 3-5 days before at the FIRST FLOOR Lab (so this is for TWO appointments - please see the scheduling desk as you leave)

## 2019-12-26 NOTE — Progress Notes (Signed)
Subjective:    Patient ID: Victoria Hale, female    DOB: 01/26/1951, 69 y.o.   MRN: [id number]  HPI  Here for wellness and f/u;  Overall doing ok;  Pt denies Chest pain, worsening SOB, DOE, wheezing, orthopnea, PND, worsening LE edema, palpitations, dizziness or syncope.  Pt denies neurological change such as new headache, facial or extremity weakness.  Pt denies polydipsia, polyuria, or low sugar symptoms. Pt states overall good compliance with treatment and medications, good tolerability, and has been trying to follow appropriate diet.  Pt denies worsening depressive symptoms, suicidal ideation or panic. No fever, night sweats, wt loss, loss of appetite, or other constitutional symptoms.  Pt states good ability with ADL's, has low fall risk, home safety reviewed and adequate, no other significant changes in hearing or vision, and only occasionally active with exercise. Has persistent psoriatic rash not responsive to topical steroids, and she has been slow to consider other internal tx, pt will f/u with derm.  Pt willing to start 2000 u qd vit d.  Has ongoing OAB symptoms and for ua with labs, but declines further med tx for now.  Also has 2 mo worsening left neck neuritic symptoms with pain to below the left elbow, mild to mod, intermittent, without weakness. Past Medical History:  Diagnosis Date  . ALLERGIC RHINITIS 03/02/2007  . ALOPECIA TOTALIS 03/02/2007  . ANA POSITIVE 09/03/2009  . ANOMALY, CONGENITAL, KIDNEY NEC 03/02/2007  . COLONIC POLYPS, HX OF 03/02/2007  . Cough 10/09/2008  . DEGENERATION, LUMBAR/LUMBOSACRAL DISC 03/02/2007  . DERMATITIS, ATOPIC 03/02/2007  . DERMATITIS, SEBORRHEIC 12/04/2007  . DYSPNEA 03/01/2007  . GERD 03/02/2007  . Hidradenitis 03/05/2010  . HYPERTENSION, BENIGN 11/28/2005  . LACTOSE INTOLERANCE 03/02/2007  . NEPHROLITHIASIS, HX OF 03/02/2007  . OTITIS EXTERNA, CHRONIC NEC 03/02/2007  . OVERACTIVE BLADDER 09/03/2009  . Overweight(278.02) 03/02/2007  . PALPITATIONS,  RECURRENT 03/02/2007  . PHOTOALLERGIC DERMATITIS 03/02/2007  . PSORIASIS 08/28/2008  . THALASSEMIA NEC 03/02/2007  . Unspecified anemia 10/09/2008   Past Surgical History:  Procedure Laterality Date  . APPENDECTOMY     ?  . CESAREAN SECTION    . exploratory abdominal surgury  1970's  . s/p BTL    . s/p C-section twice      reports that she quit smoking about 33 years ago. She has never used smokeless tobacco. She reports current alcohol use of about 1.0 standard drink of alcohol per week. She reports that she does not use drugs. family history includes Breast cancer (age of onset: 61) in her mother; Heart attack (age of onset: 54) in her father; Heart disease (age of onset: 50) in her sister. No Known Allergies Current Outpatient Medications on File Prior to Visit  Medication Sig Dispense Refill  . amLODipine (NORVASC) 2.5 MG tablet TAKE 1 TABLET(2.5 MG) BY MOUTH DAILY 90 tablet 3  . aspirin EC 81 MG tablet Take 1 tablet (81 mg total) by mouth daily. 90 tablet 11  . atorvastatin (LIPITOR) 10 MG tablet TAKE 1 TABLET(10 MG) BY MOUTH DAILY 90 tablet 3   No current facility-administered medications on file prior to visit.   Review of Systems All otherwise neg per pt     Objective:   Physical Exam BP 122/70 (BP Location: Left Arm, Patient Position: Sitting, Cuff Size: Large)   Pulse 62   Temp 98.3 F (36.8 C) (Oral)   Ht 5\' 1"  (1.549 m)   Wt 149 lb (67.6 kg)   SpO2 97%  BMI 28.15 kg/m  VS noted,  Constitutional: Pt appears in NAD HENT: Head: NCAT.  Right Ear: External ear normal.  Left Ear: External ear normal.  Eyes: . Pupils are equal, round, and reactive to light. Conjunctivae and EOM are normal Nose: without d/c or deformity Neck: Neck supple. Gross normal ROM Cardiovascular: Normal rate and regular rhythm.   Pulmonary/Chest: Effort normal and breath sounds without rales or wheezing.  Abd:  Soft, NT, ND, + BS, no organomegaly Neurological: Pt is alert. At baseline  orientation, motor grossly intact Skin: Skin is warm. No rashes, other new lesions, no LE edema Psychiatric: Pt behavior is normal without agitation  All otherwise neg per pt  Lab Results  Component Value Date   WBC 5.5 12/19/2019   HGB 12.7 12/19/2019   HCT 41.7 12/19/2019   PLT 166 12/19/2019   GLUCOSE 92 12/19/2019   CHOL 158 12/19/2019   TRIG 131 12/19/2019   HDL 57 12/19/2019   LDLCALC 78 12/19/2019   ALT 12 12/19/2019   AST 17 12/19/2019   NA 140 12/19/2019   K 4.2 12/19/2019   CL 103 12/19/2019   CREATININE 0.73 12/19/2019   BUN 10 12/19/2019   CO2 32 12/19/2019   TSH 1.12 12/19/2019   HGBA1C 5.8 (H) 12/19/2019        Assessment & Plan:

## 2019-12-29 ENCOUNTER — Encounter: Payer: Self-pay | Admitting: Internal Medicine

## 2019-12-29 NOTE — Assessment & Plan Note (Signed)
Ok for 2000 u vit d daily

## 2019-12-29 NOTE — Assessment & Plan Note (Signed)
stable overall by history and exam, recent data reviewed with pt, and pt to continue medical treatment as before,  to f/u any worsening symptoms or concerns  

## 2019-12-29 NOTE — Assessment & Plan Note (Signed)

## 2019-12-29 NOTE — Assessment & Plan Note (Signed)
Pt with persistent symptoms, but decliens OAB med trial

## 2019-12-29 NOTE — Assessment & Plan Note (Signed)
Pt laments persistent rash, but not willing for other internal tx, pt to f/u with derm

## 2019-12-29 NOTE — Assessment & Plan Note (Signed)
Worsening last few months, again declines further evaluation or tx such as MRI  I spent 31 minutes in addition to time for CPX wellness examination in preparing to see the patient by review of recent labs, imaging and procedures, obtaining and reviewing separately obtained history, communicating with the patient and family or caregiver, ordering medications, tests or procedures, and documenting clinical information in the EHR including the differential Dx, treatment, and any further evaluation and other management of left neck pain, psoriasis, oab, htn, vit d deficiency

## 2020-01-22 ENCOUNTER — Other Ambulatory Visit: Payer: Self-pay | Admitting: Internal Medicine

## 2020-04-20 ENCOUNTER — Telehealth: Payer: Self-pay | Admitting: Internal Medicine

## 2020-04-20 NOTE — Telephone Encounter (Signed)
Patient states she would like a new patient, patient states her niece see you (amanda swift)

## 2020-04-20 NOTE — Telephone Encounter (Signed)
OK w me if OK w Dr. Jonny Ruiz.

## 2020-04-21 NOTE — Telephone Encounter (Signed)
Patient would like a TOC from Oliver Barre W,MD to Arva Chafe DO   Please Advise

## 2020-04-21 NOTE — Telephone Encounter (Signed)
Called patient to schedule an TOC appointment, no answer. Left message to call our office back to set up appointment.

## 2020-04-21 NOTE — Telephone Encounter (Signed)
Ok with me 

## 2020-05-06 ENCOUNTER — Encounter: Payer: Self-pay | Admitting: Family Medicine

## 2020-05-06 ENCOUNTER — Ambulatory Visit (INDEPENDENT_AMBULATORY_CARE_PROVIDER_SITE_OTHER): Payer: Medicare Other | Admitting: Family Medicine

## 2020-05-06 ENCOUNTER — Other Ambulatory Visit: Payer: Self-pay

## 2020-05-06 VITALS — BP 120/80 | HR 79 | Temp 98.1°F | Ht 61.0 in | Wt 157.0 lb

## 2020-05-06 DIAGNOSIS — E559 Vitamin D deficiency, unspecified: Secondary | ICD-10-CM

## 2020-05-06 DIAGNOSIS — N3941 Urge incontinence: Secondary | ICD-10-CM

## 2020-05-06 DIAGNOSIS — L408 Other psoriasis: Secondary | ICD-10-CM

## 2020-05-06 MED ORDER — MIRABEGRON ER 25 MG PO TB24: 25.0000 mg | ORAL_TABLET | Freq: Every day | ORAL | 2 refills | Status: DC

## 2020-05-06 MED ORDER — VITAMIN D (ERGOCALCIFEROL) 1.25 MG (50000 UNIT) PO CAPS: 50000.0000 [IU] | ORAL_CAPSULE | ORAL | 0 refills | Status: DC

## 2020-05-06 MED ORDER — TRIAMCINOLONE ACETONIDE 0.1 % EX CREA: 1.0000 "application " | TOPICAL_CREAM | Freq: Two times a day (BID) | CUTANEOUS | 0 refills | Status: DC

## 2020-05-06 NOTE — Patient Instructions (Addendum)
If you do not hear anything about your referral in the next 1-2 weeks, call our office and ask for an update.  Keep the diet clean and stay active.  Let me know if there are cost issues with the medicine.   Mind caffeine intake.  Let us know if you need anything.

## 2020-05-06 NOTE — Progress Notes (Signed)
Chief Complaint  Patient presents with  . New Patient (Initial Visit)    Never got prescription strength D  . Urinary Frequency    for years  . Psoriasis    referral derm    Subjective: Patient is a 69 y.o. female here for transition of care.  Urinary frequency Over the last 5 years, the patient has had worsening urgency and urinary frequency.  She is not having any bleeding, discharge, abdominal exam or bowel changes.  She drinks 1 or 2 cups of coffee in the morning and then stops.  No alcohol consumption.  She is not on any diuretics.  She is getting to the point where she will plan her outings around the nearest bathroom.  If she does not get to the restroom in time, she will experience incontinence.  Psoriasis The patient has a history of psoriasis.  She was on methotrexate and folic acid in the past.  She does not want any oral therapies if possible.  She did well with Kenalog cream is requesting a refill in addition to a referral to the dermatology team.  It is on her inner thighs, torso, and upper extremities.  Patient has a history of vitamin D deficiency.  She was insufficient over the summer but never received the prescription dosage.  She has been taking a daily supplement.  Past Medical History:  Diagnosis Date  . ALLERGIC RHINITIS 03/02/2007  . ALOPECIA TOTALIS 03/02/2007  . ANA POSITIVE 09/03/2009  . ANOMALY, CONGENITAL, KIDNEY NEC 03/02/2007  . COLONIC POLYPS, HX OF 03/02/2007  . Cough 10/09/2008  . DEGENERATION, LUMBAR/LUMBOSACRAL DISC 03/02/2007  . DERMATITIS, ATOPIC 03/02/2007  . DERMATITIS, SEBORRHEIC 12/04/2007  . DYSPNEA 03/01/2007  . GERD 03/02/2007  . Hidradenitis 03/05/2010  . HYPERTENSION, BENIGN 11/28/2005  . LACTOSE INTOLERANCE 03/02/2007  . NEPHROLITHIASIS, HX OF 03/02/2007  . OTITIS EXTERNA, CHRONIC NEC 03/02/2007  . OVERACTIVE BLADDER 09/03/2009  . Overweight(278.02) 03/02/2007  . PALPITATIONS, RECURRENT 03/02/2007  . PHOTOALLERGIC DERMATITIS 03/02/2007  . PSORIASIS  08/28/2008  . THALASSEMIA NEC 03/02/2007  . Unspecified anemia 10/09/2008    Objective: BP 120/80 (BP Location: Left Arm, Patient Position: Sitting, Cuff Size: Normal)   Pulse 79   Temp 98.1 F (36.7 C) (Oral)   Ht 5\' 1"  (1.549 m)   Wt 157 lb (71.2 kg)   SpO2 98%   BMI 29.66 kg/m  General: Awake, appears stated age HEENT: MMM, EOMi Skin: Over the extensor surfaces of the elbow and posterior forearm, there are scaly plaques and patches; there are also smaller lesions of similar ilk on the lower abdomen and back Heart: RRR, no lower extremity swelling Lungs: CTAB, no rales, wheezes or rhonchi. No accessory muscle use Abdomen: Bowel sounds present, soft, mild discomfort in the suprapubic region, nondistended, no masses or organomegaly Psych: Age appropriate judgment and insight, normal affect and mood  Assessment and Plan: Urge incontinence - Plan: mirabegron ER (MYRBETRIQ) 25 MG TB24 tablet  PSORIASIS - Plan: Ambulatory referral to Dermatology, triamcinolone (KENALOG) 0.1 %  Vitamin D deficiency - Plan: Vitamin D, Ergocalciferol, (DRISDOL) 1.25 MG (50000 UNIT) CAPS capsule, VITAMIN D 25 Hydroxy (Vit-D Deficiency, Fractures)  1.  Mind caffeine, alcohol, fluid intake.  Start Myrbetriq.  Monitor blood pressure at home.  Ditropan XL as an alternative.  I will see her in 1 month for this. 2.  I will send in a new referral to the dermatology team and ordered Kenalog cream to use twice daily while her skin flares. 3.  Reorder supplemental vitamin D weekly, recheck vitamin D in 12 weeks. The patient voiced understanding and agreement to the plan.  Jilda Roche Clifton, DO 05/06/20  10:22 AM

## 2020-06-02 ENCOUNTER — Other Ambulatory Visit: Payer: Self-pay

## 2020-06-02 ENCOUNTER — Encounter (INDEPENDENT_AMBULATORY_CARE_PROVIDER_SITE_OTHER): Payer: Self-pay | Admitting: Otolaryngology

## 2020-06-02 ENCOUNTER — Ambulatory Visit (INDEPENDENT_AMBULATORY_CARE_PROVIDER_SITE_OTHER): Payer: Medicare Other | Admitting: Otolaryngology

## 2020-06-02 VITALS — Temp 96.4°F

## 2020-06-02 DIAGNOSIS — H9312 Tinnitus, left ear: Secondary | ICD-10-CM

## 2020-06-02 DIAGNOSIS — H6123 Impacted cerumen, bilateral: Secondary | ICD-10-CM | POA: Diagnosis not present

## 2020-06-02 DIAGNOSIS — H608X3 Other otitis externa, bilateral: Secondary | ICD-10-CM

## 2020-06-02 NOTE — Progress Notes (Signed)
HPI: Victoria Hale is a 70 y.o. female who presents for evaluation of ringing in her left ear that started about 2 months ago.  This began after having a loud noise in her phone when she had a phone up to the left ear little over 2 months ago.  She does have history of psoriasis and has chronic itching in her ears.  She does not notice any problems with her hearing on the right side but has some distorted hearing on the left side.  She has not had a hearing test.  Past Medical History:  Diagnosis Date   ALLERGIC RHINITIS 03/02/2007   ALOPECIA TOTALIS 03/02/2007   ANA POSITIVE 09/03/2009   ANOMALY, CONGENITAL, KIDNEY NEC 03/02/2007   COLONIC POLYPS, HX OF 03/02/2007   Cough 10/09/2008   DEGENERATION, LUMBAR/LUMBOSACRAL DISC 03/02/2007   DERMATITIS, ATOPIC 03/02/2007   DERMATITIS, SEBORRHEIC 12/04/2007   DYSPNEA 03/01/2007   GERD 03/02/2007   Hidradenitis 03/05/2010   HYPERTENSION, BENIGN 11/28/2005   LACTOSE INTOLERANCE 03/02/2007   NEPHROLITHIASIS, HX OF 03/02/2007   OTITIS EXTERNA, CHRONIC NEC 03/02/2007   OVERACTIVE BLADDER 09/03/2009   Overweight(278.02) 03/02/2007   PALPITATIONS, RECURRENT 03/02/2007   PHOTOALLERGIC DERMATITIS 03/02/2007   PSORIASIS 08/28/2008   THALASSEMIA NEC 03/02/2007   Unspecified anemia 10/09/2008   Past Surgical History:  Procedure Laterality Date   APPENDECTOMY     ?   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  Tobacco Use   Smoking status: Current Every Day Smoker    Packs/day: 0.25    Last attempt to quit: 07/27/1986    Years since quitting: 33.8   Smokeless tobacco: Never Used   Tobacco comment: Stopped in 1988  and started back in 2015  Substance and Sexual Activity   Alcohol use: Yes    Alcohol/week: 1.0 standard drink    Types: 1 Glasses of wine per week    Comment: glass of wine weekend   Drug use: No   Sexual activity: Not Currently    Comment: 1st intercourse- 18, partners- less than 5  Other Topics Concern   Not on file  Social History Narrative   Not on file   Social Determinants of Health   Financial Resource Strain: Not on file  Food Insecurity: Not on file  Transportation Needs: Not on file  Physical Activity: Not on file  Stress: Not on file  Social Connections: Not on file   Family History  Problem Relation Age of Onset   Breast cancer Mother 8       diagnosed   Heart attack Father 72   Heart disease Sister 65       had heart valve replaced   No Known Allergies Prior to Admission medications   Medication Sig Start Date End Date Taking? Authorizing Provider  amLODipine (NORVASC) 2.5 MG tablet TAKE 1 TABLET(2.5 MG) BY MOUTH DAILY 06/26/19   Corwin Levins, MD  atorvastatin (LIPITOR) 10 MG tablet TAKE 1 TABLET(10 MG) BY MOUTH DAILY 01/22/20   Corwin Levins, MD  mirabegron ER (MYRBETRIQ) 25 MG TB24 tablet Take 1  tablet (25 mg total) by mouth daily. 05/06/20   Sharlene Dory, DO  triamcinolone (KENALOG) 0.1 % Apply 1 application topically 2 (two) times daily. 05/06/20   Sharlene Dory, DO  Vitamin D, Ergocalciferol, (DRISDOL) 1.25 MG (50000 UNIT) CAPS capsule Take 1 capsule (50,000 Units total) by mouth every 7 (seven) days. 05/06/20   Sharlene Dory, DO     Positive ROS: Otherwise negative  All other systems have been reviewed and were otherwise negative with the exception of those mentioned in the HPI and as above.  Physical Exam: Constitutional: Alert, well-appearing, no acute distress Ears: External ears without lesions or tenderness.  She has flaking and scabbing of the lateral portion of the ear canals bilaterally consistent with  psoriasis.  The TMs however are clear bilaterally with good mobility on pneumatic otoscopy.  Using suction the ear canals and TMs were clear bilaterally with some small flakes that found on the TMs. Nasal: External nose without lesions.. Clear nasal passages Oral: Lips and gums without lesions. Tongue and palate mucosa without lesions. Posterior oropharynx clear. Neck: No palpable adenopathy or masses Respiratory: Breathing comfortably  Skin: No facial/neck lesions or rash noted.  Cerumen impaction removal  Date/Time: 06/02/2020 4:25 PM Performed by: Drema Halon, MD Authorized by: Drema Halon, MD   Consent:    Consent obtained:  Verbal   Consent given by:  Patient   Risks discussed:  Pain and bleeding Procedure details:    Location:  L ear and R ear   Procedure type: suction   Post-procedure details:    Inspection:  TM intact and canal normal   Hearing quality:  Improved   Patient tolerance of procedure:  Tolerated well, no immediate complications Comments:     Patient had mild scabbing and crusting in both ear canals from eczema.  Removed some flaking and scabbing from both TMs with suction.  TMs are otherwise clear.  Audiogram demonstrated essentially normal hearing in both ears with SRT's of 20 in 15 dB bilaterally and 100% word discrimination scores.  She also had type A tympanograms bilaterally.  Assessment: Chronic mild eczematoid external otitis bilaterally. Left ear tinnitus with distorted hearing.  Plan: She has triamcinolone 0.1% cream to use for the eczema in her ear canals.  This is not helped much and suggested trying Diprolene 0.05% cream or lotion application to the ear canals twice daily for 5 days. Also cleaned the ear canals as this helped in the past with suction.  Narda Bonds, MD

## 2020-06-03 ENCOUNTER — Encounter (INDEPENDENT_AMBULATORY_CARE_PROVIDER_SITE_OTHER): Payer: Self-pay

## 2020-06-08 ENCOUNTER — Encounter: Payer: Self-pay | Admitting: Family Medicine

## 2020-06-08 ENCOUNTER — Ambulatory Visit (INDEPENDENT_AMBULATORY_CARE_PROVIDER_SITE_OTHER): Payer: Medicare Other | Admitting: Family Medicine

## 2020-06-08 ENCOUNTER — Other Ambulatory Visit: Payer: Self-pay

## 2020-06-08 VITALS — BP 130/72 | HR 87 | Temp 98.5°F | Ht 61.0 in | Wt 159.2 lb

## 2020-06-08 DIAGNOSIS — R002 Palpitations: Secondary | ICD-10-CM | POA: Diagnosis not present

## 2020-06-08 DIAGNOSIS — N3941 Urge incontinence: Secondary | ICD-10-CM | POA: Diagnosis not present

## 2020-06-08 NOTE — Progress Notes (Signed)
Chief Complaint  Patient presents with  . Follow-up    Subjective: Patient is a 70 y.o. female here for fu.  She was started on Myrbetriq extended release 25 mg daily for urge incontinence at her last visit.  She did not take the medication due to fear of side effects.  She is interested in seeing urologist at this point.  There have been no changes in her symptoms.  The patient has a longstanding history of a racing heartbeat.  She gets them 1-2 times per week.  She does not have any shortness of breath, chest pain, dizziness, lightheadedness, or feel that she is skipping beats when this happens.  It last several minutes before spontaneously resolving.  There appeared to be no triggers.  No other associated symptoms.  She had a monitor done in 2013 that was unremarkable.  She does not consume excessive amounts of caffeine or alcohol.  She stays well-hydrated overall.  No new medications.  Past Medical History:  Diagnosis Date  . ALLERGIC RHINITIS 03/02/2007  . ALOPECIA TOTALIS 03/02/2007  . ANA POSITIVE 09/03/2009  . ANOMALY, CONGENITAL, KIDNEY NEC 03/02/2007  . COLONIC POLYPS, HX OF 03/02/2007  . Cough 10/09/2008  . DEGENERATION, LUMBAR/LUMBOSACRAL DISC 03/02/2007  . DERMATITIS, ATOPIC 03/02/2007  . DERMATITIS, SEBORRHEIC 12/04/2007  . DYSPNEA 03/01/2007  . GERD 03/02/2007  . Hidradenitis 03/05/2010  . HYPERTENSION, BENIGN 11/28/2005  . LACTOSE INTOLERANCE 03/02/2007  . NEPHROLITHIASIS, HX OF 03/02/2007  . OTITIS EXTERNA, CHRONIC NEC 03/02/2007  . OVERACTIVE BLADDER 09/03/2009  . Overweight(278.02) 03/02/2007  . PALPITATIONS, RECURRENT 03/02/2007  . PHOTOALLERGIC DERMATITIS 03/02/2007  . PSORIASIS 08/28/2008  . THALASSEMIA NEC 03/02/2007  . Unspecified anemia 10/09/2008    Objective: BP 130/72 (BP Location: Left Arm, Patient Position: Sitting, Cuff Size: Normal)   Pulse 87   Temp 98.5 F (36.9 C) (Oral)   Ht 5\' 1"  (1.549 m)   Wt 159 lb 4 oz (72.2 kg)   SpO2 97%   BMI 30.09 kg/m  General:  Awake, appears stated age Neck: Symmetric, no thyromegaly noted Abdomen: Bowel sounds present, soft, nontender, nondistended Heart: RRR, no lower extremity edema Lungs: CTAB, no rales, wheezes or rhonchi. No accessory muscle use Psych: Age appropriate judgment and insight, normal affect and mood  Assessment and Plan: Urge incontinence - Plan: Ambulatory referral to Urology  Palpitations  1.  I doubt she would be pleased with the side effect profile of anticholinergics which we did discuss.  We will remove Myrbetriq from her medication list.  Refer to urology per her request 2. stay hydrated.  Offered to refer to cardiology but did reassure her that the infrequent nature of this in addition to lack of associated symptoms is reassuring and she does not have to do anything at this point. I will see her in 6-7 months for her physical or as needed. The patient voiced understanding and agreement to the plan.  [place], DO 06/08/20  12:58 PM

## 2020-06-08 NOTE — Patient Instructions (Addendum)
If you do not hear anything about your referral in the next 1-2 weeks, call our office and ask for an update.  Try to drink 55-60 oz of water daily outside of exercise.  Mind caffeine intake.   Let me know if you would like to see the cardiology team for your palpitations/racing heart beat.   Let us know if you need anything.

## 2020-07-06 ENCOUNTER — Ambulatory Visit (INDEPENDENT_AMBULATORY_CARE_PROVIDER_SITE_OTHER): Payer: Medicare Other | Admitting: Family Medicine

## 2020-07-06 ENCOUNTER — Other Ambulatory Visit: Payer: Self-pay

## 2020-07-06 ENCOUNTER — Encounter: Payer: Self-pay | Admitting: Family Medicine

## 2020-07-06 VITALS — BP 142/86 | HR 71 | Temp 98.1°F | Ht 61.0 in | Wt 157.1 lb

## 2020-07-06 DIAGNOSIS — R002 Palpitations: Secondary | ICD-10-CM | POA: Diagnosis not present

## 2020-07-06 DIAGNOSIS — I10 Essential (primary) hypertension: Secondary | ICD-10-CM

## 2020-07-06 MED ORDER — METOPROLOL SUCCINATE ER 50 MG PO TB24: 50.0000 mg | ORAL_TABLET | Freq: Every day | ORAL | 3 refills | Status: DC

## 2020-07-06 NOTE — Progress Notes (Signed)
Chief Complaint  Patient presents with  . Hypertension    Subjective Victoria Hale is a 70 y.o. female who presents for hypertension follow up. She does monitor home blood pressures. Blood pressures ranging from 170-190s/70-80's on average. She is compliant with medication- Norvasc 2.5 mg/d. Patient has these side effects of medication: none She is usually adhering to a healthy diet overall. Current exercise: none No Cp or SOB.  She is having pounding in her chest when her BP went high. Happened Sat night, once Sunday and nothing today.  She is also having pain in the L arm and side of neck. She is sitting down when this starts. She has associated light headedness.    Past Medical History:  Diagnosis Date  . ALLERGIC RHINITIS 03/02/2007  . ALOPECIA TOTALIS 03/02/2007  . ANA POSITIVE 09/03/2009  . ANOMALY, CONGENITAL, KIDNEY NEC 03/02/2007  . COLONIC POLYPS, HX OF 03/02/2007  . Cough 10/09/2008  . DEGENERATION, LUMBAR/LUMBOSACRAL DISC 03/02/2007  . DERMATITIS, ATOPIC 03/02/2007  . DERMATITIS, SEBORRHEIC 12/04/2007  . DYSPNEA 03/01/2007  . GERD 03/02/2007  . Hidradenitis 03/05/2010  . HYPERTENSION, BENIGN 11/28/2005  . LACTOSE INTOLERANCE 03/02/2007  . NEPHROLITHIASIS, HX OF 03/02/2007  . OTITIS EXTERNA, CHRONIC NEC 03/02/2007  . OVERACTIVE BLADDER 09/03/2009  . Overweight(278.02) 03/02/2007  . PALPITATIONS, RECURRENT 03/02/2007  . PHOTOALLERGIC DERMATITIS 03/02/2007  . PSORIASIS 08/28/2008  . THALASSEMIA NEC 03/02/2007  . Unspecified anemia 10/09/2008    Exam BP (!) 142/86 (BP Location: Left Arm, Patient Position: Sitting, Cuff Size: Normal)   Pulse 71   Temp 98.1 F (36.7 C) (Oral)   Ht 5\' 1"  (1.549 m)   Wt 157 lb 2 oz (71.3 kg)   SpO2 98%   BMI 29.69 kg/m  General:  well developed, well nourished, in no apparent distress Heart: RRR, no bruits, no LE edema Lungs: clear to auscultation, no accessory muscle use Psych: well oriented with normal range of affect and appropriate  judgment/insight  Essential hypertension - Plan: metoprolol succinate (TOPROL-XL) 50 MG 24 hr tablet  Palpitations - Plan: Ambulatory referral to Cardiology  BP not terribly high today. Counseled on diet and exercise. Cont Norvasc 2.5 mg/d. Add Toprol XL for 1/2. F/u prn as she is being referred to Dr. - a friend recommended Dr. Rennis Golden. The patient voiced understanding and agreement to the plan.  Rennis Golden [place], DO 07/06/20  11:49 AM

## 2020-07-07 NOTE — Progress Notes (Unsigned)
Cardiology Office Note:   Date:  07/08/2020  NAME:  Victoria Hale    MRN: [id number] DOB:  04-Jun-1950   PCP:  Sharlene Dory, DO  Cardiologist:  No primary care provider on file.   Referring MD: Sharlene Dory*   Chief Complaint  Patient presents with  . Palpitations         History of Present Illness:   Victoria Hale is a 70 y.o. female with a hx of HTN who is being seen today for the evaluation of palpitations at the request of Sharlene Dory, DO.  She reports she was seen in the [facility] emergency room system on Saturday.  She had elevated blood pressure up to 190 and palpitations.  Work-up there showed normal TSH 1.77.  High-sensitivity troponins were negative x2.  EKG showed normal sinus rhythm.  She underwent a CT head for dizziness that was negative.  Chest x-ray was unremarkable.  She was discharged to follow-up with her primary care physician.  He prescribed metoprolol but she did not want to take this.  Her blood pressure today in office is 148/74.  She is still getting palpitations.  They occur with exertion.  She is not checking her blood pressure at home.  She does have hyperlipidemia and takes Lipitor.  Most recent LDL cholesterol 78.  She is never had a heart attack or stroke.  She is a tobacco user.  She quit Saturday.  Apparently she smoked on and off for roughly 15 years.  She denies any exertional chest pain or shortness of breath.  It is mainly symptoms of heartbeat sensation that are rapid with exertion.  No drug use reported.  No alcohol use in excess reported.  She is a retired English as a second language teacher.  She has 2 children and 1 grandchild.  She is divorced.  Her father did have heart disease.  Her EKG in office demonstrates sinus bradycardia with no acute ischemic changes or evidence of infarction.  Cardiovascular examination is normal.  A1c 5.8 T chol 158, LDL 78, HDL 57, TG 131  Past Medical History: Past Medical History:  Diagnosis Date  .  ALLERGIC RHINITIS 03/02/2007  . ALOPECIA TOTALIS 03/02/2007  . ANA POSITIVE 09/03/2009  . ANOMALY, CONGENITAL, KIDNEY NEC 03/02/2007  . COLONIC POLYPS, HX OF 03/02/2007  . Cough 10/09/2008  . DEGENERATION, LUMBAR/LUMBOSACRAL DISC 03/02/2007  . DERMATITIS, ATOPIC 03/02/2007  . DERMATITIS, SEBORRHEIC 12/04/2007  . DYSPNEA 03/01/2007  . GERD 03/02/2007  . Hidradenitis 03/05/2010  . HYPERTENSION, BENIGN 11/28/2005  . LACTOSE INTOLERANCE 03/02/2007  . NEPHROLITHIASIS, HX OF 03/02/2007  . OTITIS EXTERNA, CHRONIC NEC 03/02/2007  . OVERACTIVE BLADDER 09/03/2009  . Overweight(278.02) 03/02/2007  . PALPITATIONS, RECURRENT 03/02/2007  . PHOTOALLERGIC DERMATITIS 03/02/2007  . PSORIASIS 08/28/2008  . THALASSEMIA NEC 03/02/2007  . Unspecified anemia 10/09/2008    Past Surgical History: Past Surgical History:  Procedure Laterality Date  . APPENDECTOMY     ?  . CESAREAN SECTION    . exploratory abdominal surgury  1970's  . s/p BTL    . s/p C-section twice      Current Medications: Current Meds  Medication Sig  . amLODipine (NORVASC) 2.5 MG tablet TAKE 1 TABLET(2.5 MG) BY MOUTH DAILY  . atorvastatin (LIPITOR) 10 MG tablet TAKE 1 TABLET(10 MG) BY MOUTH DAILY  . triamcinolone (KENALOG) 0.1 % Apply 1 application topically 2 (two) times daily.  . Vitamin D, Ergocalciferol, (DRISDOL) 1.25 MG (50000 UNIT) CAPS capsule Take 1 capsule (50,000 Units  total) by mouth every 7 (seven) days.     Allergies:    Patient has no known allergies.   Social History: Social History   Socioeconomic History  . Marital status: Divorced    Spouse name: Not on file  . Number of children: 2  . Years of education: Not on file  . Highest education level: Not on file  Occupational History  . 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 Smoking status: Former Smoker    Packs/day: 0.25    Years: 15.00    Pack years: 3.75    Quit date: 07/27/1986    Years since quitting: 33.9  . Smokeless tobacco: Never Used  .  Tobacco comment: Stopped in 1988 and started back in 2015  Substance and Sexual Activity  . Alcohol use: Yes    Alcohol/week: 1.0 standard drink    Types: 1 Glasses of wine per week    Comment: glass of wine weekend  . Drug use: No  . Sexual activity: Not Currently    Comment: 1st intercourse- 18, partners- less than 5  Other Topics Concern  . Not on file  Social History Narrative  . Not on file   Social Determinants of Health   Financial Resource Strain: Not on file  Food Insecurity: Not on file  Transportation Needs: Not on file  Physical Activity: Not on file  Stress: Not on file  Social Connections: Not on file     Family History: The patient's family history includes Breast cancer (age of onset: 9) in her mother; Heart attack (age of onset: 53) in her father; Heart disease (age of onset: 63) in her sister.  ROS:   All other ROS reviewed and negative. Pertinent positives noted in the HPI.     EKGs/Labs/Other Studies Reviewed:   The following studies were personally reviewed by me today:  EKG:  EKG is ordered today.  The ekg ordered today demonstrates sinus bradycardia heart rate 59, no acute ischemic changes, no evidence of infarction, and was personally reviewed by me.   Recent Labs: 12/19/2019: ALT 12; BUN 10; Creat 0.73; Hemoglobin 12.7; Platelets 166; Potassium 4.2; Sodium 140; TSH 1.12   Recent Lipid Panel    Component Value Date/Time   CHOL 158 12/19/2019 1046   TRIG 131 12/19/2019 1046   HDL 57 12/19/2019 1046   CHOLHDL 2.8 12/19/2019 1046   VLDL 17.8 12/25/2018 0933   LDLCALC 78 12/19/2019 1046    Physical Exam:   VS:  BP (!) 148/74   Pulse 67   Ht 5\' 1"  (1.549 m)   Wt 160 lb (72.6 kg)   SpO2 97%   BMI 30.23 kg/m    Wt Readings from Last 3 Encounters:  07/08/20 160 lb (72.6 kg)  07/06/20 157 lb 2 oz (71.3 kg)  06/08/20 159 lb 4 oz (72.2 kg)    General: Well nourished, well developed, in no acute distress Head: Atraumatic, normal size  Eyes:  PEERLA, EOMI  Neck: Supple, no JVD Endocrine: No thryomegaly Cardiac: Normal S1, S2; RRR; no murmurs, rubs, or gallops Lungs: Clear to auscultation bilaterally, no wheezing, rhonchi or rales  Abd: Soft, nontender, no hepatomegaly  Ext: No edema, pulses 2+ Musculoskeletal: No deformities, BUE and BLE strength normal and equal Skin: Warm and dry, no rashes   Neuro: Alert and oriented to person, place, time, and situation, CNII-XII grossly intact, no focal deficits  Psych: Normal mood and affect   ASSESSMENT:   Victoria Croft  CHRISTIANNE Hale is a 70 y.o. female who presents for the following: 1. Palpitations   2. Primary hypertension   3. Tobacco abuse     PLAN:   1. Palpitations -Palpitations occurring with exertion.  EKG in office is normal.  No ischemic changes or evidence of infarction.  Recent emergency room visit shows normal high-sensitivity troponins.  TSH normal.  Her cardiovascular exam is normal.  There are no murmurs. -I suspect her symptoms are related to blood pressure.  She will keep a blood pressure log.  2. Primary hypertension -BP slightly elevated today.  148/74.  We discussed increasing her amlodipine and holding her metoprolol.  She would like to await the results of her monitor before we increase her blood pressure.  She was given information about salty foods to avoid.  She will keep blood pressure log.  3. Tobacco abuse -It is important that she refrain from cigarette use.  She will work on this moving forward.  Counseling provided in office.  Disposition: Return in about 3 months (around 10/05/2020).  Medication Adjustments/Labs and Tests Ordered: Current medicines are reviewed at length with the patient today.  Concerns regarding medicines are outlined above.  Orders Placed This Encounter  Procedures  . LONG TERM MONITOR (3-14 DAYS)  . EKG 12-Lead   No orders of the defined types were placed in this encounter.   Patient Instructions  Medication Instructions:  The  current medical regimen is effective;  continue present plan and medications.  *If you need a refill on your cardiac medications before your next appointment, please call your pharmacy*   Testing/Procedures: ZIO XT- Long Term Monitor Instructions   Your physician has requested you wear your ZIO patch monitor___7____days.   This is a single patch monitor.  [employer] supplies one patch monitor per enrollment.  Additional stickers are not available.   Please do not apply patch if you will be having a Nuclear Stress Test, Echocardiogram, Cardiac CT, MRI, or Chest Xray during the time frame you would be wearing the monitor. The patch cannot be worn during these tests.  You cannot remove and re-apply the ZIO XT patch monitor.   Your ZIO patch monitor will be sent USPS Priority mail from [facility] directly to your home address. The monitor may also be mailed to a PO BOX if home delivery is not available.   It may take 3-5 days to receive your monitor after you have been enrolled.   Once you have received you monitor, please review enclosed instructions.  Your monitor has already been registered assigning a specific monitor serial # to you.   Applying the monitor   Shave hair from upper left chest.   Hold abrader disc by orange tab.  Rub abrader in 40 strokes over left upper chest as indicated in your monitor instructions.   Clean area with 4 enclosed alcohol pads .  Use all pads to assure are is cleaned thoroughly.  Let dry.   Apply patch as indicated in monitor instructions.  Patch will be place under collarbone on left side of chest with arrow pointing upward.   Rub patch adhesive wings for 2 minutes.Remove white label marked "1".  Remove white label marked "2".  Rub patch adhesive wings for 2 additional minutes.   While looking in a mirror, press and release button in center of patch.  A small green light will flash 3-4 times .  This will be your only indicator the monitor has been  turned on.  Do not shower for the first 24 hours.  You may shower after the first 24 hours.   Press button if you feel a symptom. You will hear a small click.  Record Date, Time and Symptom in the Patient Log Book.   When you are ready to remove patch, follow instructions on last 2 pages of Patient Log Book.  Stick patch monitor onto last page of Patient Log Book.   Place Patient Log Book in [place] box.  Use locking tab on box and tape box closed securely.  The [employer] has [employer] on it.  Please place in mailbox as soon as possible.  Your physician should have your test results approximately 7 days after the monitor has been mailed back to [facility].   Call [facility] Customer Care at [phone] if you have questions regarding your ZIO XT patch monitor.  Call them immediately if you see an orange light blinking on your monitor.   If your monitor falls off in less than 4 days contact our Monitor department at [phone].  If your monitor becomes loose or falls off after 4 days call [employer] at [phone] for suggestions on securing your monitor.     Follow-Up: At [facility], you and your health needs are our priority.  As part of our continuing mission to provide you with exceptional heart care, we have created designated Provider Care Teams.  These Care Teams include your primary Cardiologist (physician) and Advanced Practice Providers (APPs -  Physician Assistants and Nurse Practitioners) who all work together to provide you with the care you need, when you need it.  We recommend signing up for the patient portal called "MyChart".  Sign up information is provided on this After Visit Summary.  MyChart is used to connect with patients for Virtual Visits (Telemedicine).  Patients are able to view lab/test results, encounter notes, upcoming appointments, etc.  Non-urgent messages can be sent to your provider as well.   To learn more about what you can do  with MyChart, go to [email or web address].    Your next appointment:   3 month(s)  The format for your next appointment:   In Person  Provider:   Lennie Odor, MD          Signed, Lenna Gilford. Flora Lipps, MD [facility]  [address], [address] [place], [place] [address] [phone]  07/08/2020 1:33 PM

## 2020-07-08 ENCOUNTER — Ambulatory Visit (INDEPENDENT_AMBULATORY_CARE_PROVIDER_SITE_OTHER): Payer: Medicare Other

## 2020-07-08 ENCOUNTER — Encounter: Payer: Self-pay | Admitting: Cardiovascular Disease

## 2020-07-08 ENCOUNTER — Other Ambulatory Visit: Payer: Self-pay

## 2020-07-08 ENCOUNTER — Ambulatory Visit (INDEPENDENT_AMBULATORY_CARE_PROVIDER_SITE_OTHER): Payer: Medicare Other | Admitting: Cardiovascular Disease

## 2020-07-08 VITALS — BP 148/74 | HR 67 | Ht 61.0 in | Wt 160.0 lb

## 2020-07-08 DIAGNOSIS — R002 Palpitations: Secondary | ICD-10-CM | POA: Diagnosis not present

## 2020-07-08 DIAGNOSIS — F17210 Nicotine dependence, cigarettes, uncomplicated: Secondary | ICD-10-CM

## 2020-07-08 DIAGNOSIS — I10 Essential (primary) hypertension: Secondary | ICD-10-CM | POA: Diagnosis not present

## 2020-07-08 DIAGNOSIS — Z720 Tobacco use: Secondary | ICD-10-CM

## 2020-07-08 NOTE — Patient Instructions (Signed)
Medication Instructions:  The current medical regimen is effective;  continue present plan and medications.  *If you need a refill on your cardiac medications before your next appointment, please call your pharmacy*   Testing/Procedures: ZIO XT- Long Term Monitor Instructions   Your physician has requested you wear your ZIO patch monitor___7____days.   This is a single patch monitor.  [employer] supplies one patch monitor per enrollment.  Additional stickers are not available.   Please do not apply patch if you will be having a Nuclear Stress Test, Echocardiogram, Cardiac CT, MRI, or Chest Xray during the time frame you would be wearing the monitor. The patch cannot be worn during these tests.  You cannot remove and re-apply the ZIO XT patch monitor.   Your ZIO patch monitor will be sent USPS Priority mail from [facility] directly to your home address. The monitor may also be mailed to a PO BOX if home delivery is not available.   It may take 3-5 days to receive your monitor after you have been enrolled.   Once you have received you monitor, please review enclosed instructions.  Your monitor has already been registered assigning a specific monitor serial # to you.   Applying the monitor   Shave hair from upper left chest.   Hold abrader disc by orange tab.  Rub abrader in 40 strokes over left upper chest as indicated in your monitor instructions.   Clean area with 4 enclosed alcohol pads .  Use all pads to assure are is cleaned thoroughly.  Let dry.   Apply patch as indicated in monitor instructions.  Patch will be place under collarbone on left side of chest with arrow pointing upward.   Rub patch adhesive wings for 2 minutes.Remove white label marked "1".  Remove white label marked "2".  Rub patch adhesive wings for 2 additional minutes.   While looking in a mirror, press and release button in center of patch.  A small green light will flash 3-4 times .  This will be your only  indicator the monitor has been turned on.     Do not shower for the first 24 hours.  You may shower after the first 24 hours.   Press button if you feel a symptom. You will hear a small click.  Record Date, Time and Symptom in the Patient Log Book.   When you are ready to remove patch, follow instructions on last 2 pages of Patient Log Book.  Stick patch monitor onto last page of Patient Log Book.   Place Patient Log Book in [place] box.  Use locking tab on box and tape box closed securely.  The [employer] has [employer] on it.  Please place in mailbox as soon as possible.  Your physician should have your test results approximately 7 days after the monitor has been mailed back to [facility].   Call [facility] Customer Care at [phone] if you have questions regarding your ZIO XT patch monitor.  Call them immediately if you see an orange light blinking on your monitor.   If your monitor falls off in less than 4 days contact our Monitor department at [phone].  If your monitor becomes loose or falls off after 4 days call [employer] at [phone] for suggestions on securing your monitor.     Follow-Up: At [facility], you and your health needs are our priority.  As part of our continuing mission to provide you with exceptional heart care, we  have created designated Provider Care Teams.  These Care Teams include your primary Cardiologist (physician) and Advanced Practice Providers (APPs -  Physician Assistants and Nurse Practitioners) who all work together to provide you with the care you need, when you need it.  We recommend signing up for the patient portal called "MyChart".  Sign up information is provided on this After Visit Summary.  MyChart is used to connect with patients for Virtual Visits (Telemedicine).  Patients are able to view lab/test results, encounter notes, upcoming appointments, etc.  Non-urgent messages can be sent to your provider as well.   To  learn more about what you can do with MyChart, go to [email or web address].    Your next appointment:   3 month(s)  The format for your next appointment:   In Person  Provider:   Lennie Odor, MD

## 2020-07-13 ENCOUNTER — Other Ambulatory Visit: Payer: Self-pay | Admitting: Family Medicine

## 2020-07-13 MED ORDER — AMLODIPINE BESYLATE 2.5 MG PO TABS: ORAL_TABLET | ORAL | 3 refills | Status: DC

## 2020-07-15 ENCOUNTER — Other Ambulatory Visit: Payer: Self-pay

## 2020-07-15 ENCOUNTER — Ambulatory Visit (INDEPENDENT_AMBULATORY_CARE_PROVIDER_SITE_OTHER): Payer: Medicare Other | Admitting: Family Medicine

## 2020-07-15 ENCOUNTER — Encounter: Payer: Self-pay | Admitting: Family Medicine

## 2020-07-15 VITALS — BP 152/72 | HR 84 | Temp 98.9°F | Ht 61.0 in | Wt 157.0 lb

## 2020-07-15 DIAGNOSIS — I10 Essential (primary) hypertension: Secondary | ICD-10-CM | POA: Diagnosis not present

## 2020-07-15 MED ORDER — OLMESARTAN MEDOXOMIL 20 MG PO TABS: 20.0000 mg | ORAL_TABLET | Freq: Every day | ORAL | 2 refills | Status: DC

## 2020-07-15 NOTE — Patient Instructions (Addendum)
Consider Biotene mouthwash for dry mouth.   Keep the diet clean and stay active.  Continue to check blood pressure at home. Bring your blood pressure monitor to your next appointment.  [facility]: [phone]  Let us know if you need anything.

## 2020-07-15 NOTE — Progress Notes (Signed)
Chief Complaint  Patient presents with  . Hypertension    Subjective Victoria Hale is a 70 y.o. female who presents for hypertension follow up. She does monitor home blood pressures. Blood pressures ranging from 120-130's/70's on average. She is compliant with medications- Norvasc 2.5 mg/d, . Patient has these side effects of medication: none She took 5 mg daily over weekend and did not feel good. Felt better after going back to 2.5 mg/d.  She is adhering to a healthy diet overall. Current exercise: none No SOB or CP.    Past Medical History:  Diagnosis Date  . ALLERGIC RHINITIS 03/02/2007  . ALOPECIA TOTALIS 03/02/2007  . ANA POSITIVE 09/03/2009  . ANOMALY, CONGENITAL, KIDNEY NEC 03/02/2007  . COLONIC POLYPS, HX OF 03/02/2007  . Cough 10/09/2008  . DEGENERATION, LUMBAR/LUMBOSACRAL DISC 03/02/2007  . DERMATITIS, ATOPIC 03/02/2007  . DERMATITIS, SEBORRHEIC 12/04/2007  . DYSPNEA 03/01/2007  . GERD 03/02/2007  . Hidradenitis 03/05/2010  . HYPERTENSION, BENIGN 11/28/2005  . LACTOSE INTOLERANCE 03/02/2007  . NEPHROLITHIASIS, HX OF 03/02/2007  . OTITIS EXTERNA, CHRONIC NEC 03/02/2007  . OVERACTIVE BLADDER 09/03/2009  . Overweight(278.02) 03/02/2007  . PALPITATIONS, RECURRENT 03/02/2007  . PHOTOALLERGIC DERMATITIS 03/02/2007  . PSORIASIS 08/28/2008  . THALASSEMIA NEC 03/02/2007  . Unspecified anemia 10/09/2008    Exam BP (!) 152/72 (BP Location: Left Arm, Patient Position: Sitting, Cuff Size: Normal)   Pulse 84   Temp 98.9 F (37.2 C) (Oral)   Ht 5\' 1"  (1.549 m)   Wt 157 lb (71.2 kg)   SpO2 96%   BMI 29.66 kg/m  General:  well developed, well nourished, in no apparent distress Heart: RRR, no bruits, no LE edema Lungs: clear to auscultation, no accessory muscle use Psych: well oriented with normal range of affect and appropriate judgment/insight  Essential hypertension - Plan: olmesartan (BENICAR) 20 MG tablet, Basic metabolic panel  Status: Chronic, uncontrolled. Stop Norvasc, start above  daily. Cont to monitor BP at home. Counseled on diet and exercise. F/u in 1 week for labs, 1 mo for reck. The patient voiced understanding and agreement to the plan.  [place], DO 07/15/20  10:15 AM

## 2020-07-20 ENCOUNTER — Telehealth: Payer: Self-pay | Admitting: Family Medicine

## 2020-07-20 DIAGNOSIS — I10 Essential (primary) hypertension: Secondary | ICD-10-CM

## 2020-07-20 NOTE — Telephone Encounter (Signed)
Caller : Tanielle Emigh  Call Back @ [phone]  Patient states she had an episode last night with her blood pressure . Patient states she called EMS , her blood pressure was 210/110 when they arrived to her home, Patient states EMS stayed with her until it came down to 157/74. Patient would also like Dr Carmelia Roller to know that she turn in her heart monitor yesterday around 5pm.

## 2020-07-22 ENCOUNTER — Other Ambulatory Visit: Payer: Self-pay

## 2020-07-22 ENCOUNTER — Other Ambulatory Visit (INDEPENDENT_AMBULATORY_CARE_PROVIDER_SITE_OTHER): Payer: Medicare Other

## 2020-07-22 DIAGNOSIS — I10 Essential (primary) hypertension: Secondary | ICD-10-CM | POA: Diagnosis not present

## 2020-07-22 LAB — BASIC METABOLIC PANEL
BUN: 13 mg/dL (ref 6–23)
CO2: 29 mEq/L (ref 19–32)
Calcium: 9.5 mg/dL (ref 8.4–10.5)
Chloride: 101 mEq/L (ref 96–112)
Creatinine, Ser: 0.85 mg/dL (ref 0.40–1.20)
GFR: 69.57 mL/min (ref >60.00)
Glucose, Bld: 78 mg/dL (ref 70–99)
Potassium: 4.2 mEq/L (ref 3.5–5.1)
Sodium: 138 mEq/L (ref 135–145)

## 2020-07-22 MED ORDER — OLMESARTAN MEDOXOMIL 20 MG PO TABS: 20.0000 mg | ORAL_TABLET | Freq: Two times a day (BID) | ORAL | 1 refills | Status: DC

## 2020-07-22 NOTE — Telephone Encounter (Signed)
pcp will increase the Benicar to twice daily and followup in March as planned

## 2020-07-29 ENCOUNTER — Other Ambulatory Visit: Payer: Medicare Other

## 2020-07-29 ENCOUNTER — Other Ambulatory Visit: Payer: Self-pay

## 2020-07-29 MED ORDER — ATORVASTATIN CALCIUM 10 MG PO TABS: ORAL_TABLET | ORAL | 1 refills | Status: DC

## 2020-08-03 ENCOUNTER — Telehealth: Payer: Self-pay | Admitting: Cardiovascular Disease

## 2020-08-03 NOTE — Telephone Encounter (Signed)
    Pt calling to follow up heart monitor result. She said she would like to know the result since she still have the same symptoms

## 2020-08-04 ENCOUNTER — Other Ambulatory Visit: Payer: Self-pay

## 2020-08-04 ENCOUNTER — Ambulatory Visit (INDEPENDENT_AMBULATORY_CARE_PROVIDER_SITE_OTHER): Payer: Medicare Other | Admitting: Family Medicine

## 2020-08-04 ENCOUNTER — Encounter: Payer: Self-pay | Admitting: Family Medicine

## 2020-08-04 VITALS — BP 124/60 | HR 59 | Resp 16 | Ht 61.0 in | Wt 153.0 lb

## 2020-08-04 DIAGNOSIS — I10 Essential (primary) hypertension: Secondary | ICD-10-CM | POA: Diagnosis not present

## 2020-08-04 MED ORDER — AMLODIPINE BESYLATE 5 MG PO TABS: 5.0000 mg | ORAL_TABLET | Freq: Every day | ORAL | 3 refills | Status: DC

## 2020-08-04 MED ORDER — OLMESARTAN MEDOXOMIL 20 MG PO TABS: 20.0000 mg | ORAL_TABLET | Freq: Every day | ORAL | 2 refills | Status: DC

## 2020-08-04 NOTE — Patient Instructions (Signed)
Take the 2.5 mg tab of the amlodipine daily for 10-14 days and then take 5 mg daily.   Keep the diet clean and stay active.  Monitor your blood pressures at home.  Let us know if you need anything.

## 2020-08-04 NOTE — Progress Notes (Signed)
Chief Complaint  Patient presents with  . Hypertension    4 week follow up-168/65, 185/88 08/01/2020     Subjective Victoria Hale is a 70 y.o. female who presents for hypertension follow up. She does monitor home blood pressures. Blood pressures ranging from 130-140's/70's on average. She is compliant with medication-olmesartan 20 mg daily. Patient has these side effects of medication: Some lightheadedness She is adhering to a healthy diet overall. Current exercise: none No CP or SOB. She is still getting palpitations.  She saw the cardiology team and her long-term monitor is currently in process.  The cardiology team thinks the palpitations are related to her blood pressure.  Whenever she feels them, she gets anxious and her blood pressure spikes.   Past Medical History:  Diagnosis Date  . ALLERGIC RHINITIS 03/02/2007  . ALOPECIA TOTALIS 03/02/2007  . ANA POSITIVE 09/03/2009  . ANOMALY, CONGENITAL, KIDNEY NEC 03/02/2007  . COLONIC POLYPS, HX OF 03/02/2007  . Cough 10/09/2008  . DEGENERATION, LUMBAR/LUMBOSACRAL DISC 03/02/2007  . DERMATITIS, ATOPIC 03/02/2007  . DERMATITIS, SEBORRHEIC 12/04/2007  . DYSPNEA 03/01/2007  . GERD 03/02/2007  . Hidradenitis 03/05/2010  . HYPERTENSION, BENIGN 11/28/2005  . LACTOSE INTOLERANCE 03/02/2007  . NEPHROLITHIASIS, HX OF 03/02/2007  . OTITIS EXTERNA, CHRONIC NEC 03/02/2007  . OVERACTIVE BLADDER 09/03/2009  . Overweight(278.02) 03/02/2007  . PALPITATIONS, RECURRENT 03/02/2007  . PHOTOALLERGIC DERMATITIS 03/02/2007  . PSORIASIS 08/28/2008  . THALASSEMIA NEC 03/02/2007  . Unspecified anemia 10/09/2008    Exam BP 124/60 (BP Location: Left Arm, Patient Position: Sitting, Cuff Size: Large)   Pulse (!) 59   Resp 16   Ht 5\' 1"  (1.549 m)   Wt 153 lb (69.4 kg)   SpO2 98%   BMI 28.91 kg/m  General:  well developed, well nourished, in no apparent distress Heart: RRR, no bruits, no LE edema Lungs: clear to auscultation, no accessory muscle use Psych: well oriented  with normal range of affect and appropriate judgment/insight  Essential hypertension - Plan: amLODipine (NORVASC) 5 MG tablet  Stop the Benicar, we will restart amlodipine.  She will take 2.5 mg daily for the next 10 to 14 days.  Then she will start taking 5 mg daily.  Patient adverse effects versus coincidental malaise.  Would like to try to avoid diuretics and her she is going frequently.  Could trial carvedilol if no improvement.  May have to add beta-blocker for palpitation management.  Await final long-term monitor read.  Continue to monitor blood pressure at home. F/u in 1 month. The patient voiced understanding and agreement to the plan.  [place], DO 08/04/20  10:23 AM

## 2020-08-12 ENCOUNTER — Ambulatory Visit: Payer: Medicare Other | Admitting: Family Medicine

## 2020-09-04 ENCOUNTER — Other Ambulatory Visit: Payer: Self-pay

## 2020-09-04 ENCOUNTER — Ambulatory Visit (INDEPENDENT_AMBULATORY_CARE_PROVIDER_SITE_OTHER): Payer: Medicare Other | Admitting: Family Medicine

## 2020-09-04 ENCOUNTER — Encounter: Payer: Self-pay | Admitting: Family Medicine

## 2020-09-04 ENCOUNTER — Other Ambulatory Visit (HOSPITAL_BASED_OUTPATIENT_CLINIC_OR_DEPARTMENT_OTHER): Payer: Self-pay

## 2020-09-04 VITALS — BP 120/70 | HR 60 | Temp 98.4°F | Ht 61.0 in | Wt 150.0 lb

## 2020-09-04 DIAGNOSIS — I10 Essential (primary) hypertension: Secondary | ICD-10-CM | POA: Diagnosis not present

## 2020-09-04 DIAGNOSIS — F411 Generalized anxiety disorder: Secondary | ICD-10-CM | POA: Diagnosis not present

## 2020-09-04 MED ORDER — BUSPIRONE HCL 7.5 MG PO TABS
7.5000 mg | ORAL_TABLET | Freq: Every day | ORAL | 2 refills | Status: DC
Start: 2020-09-04 — End: 2020-10-05
  Filled 2020-09-04: qty 30, 30d supply, fill #0

## 2020-09-04 NOTE — Progress Notes (Signed)
Chief Complaint  Patient presents with  . Follow-up    Subjective Victoria Hale is a 70 y.o. female who presents for hypertension follow up. She does monitor home blood pressures. Blood pressures ranging from 140-150's/60's on average.  She does keep her arm to her side when she checks her blood pressure. She is compliant with medication-Norvasc 5 mg/d. Patient has these side effects of medication: none She is adhering to a healthy diet overall. Current exercise: active in yard and at home.  No CP or SOB.  Anxiety Over the past 2 months, the patient has had more anxiety/worry.  She denies any decreased interest in doing things or depression.  She has never been on any medicine before.  She is not following with a counselor or psychologist.  No homicidal or suicidal ideation.  She does not feel any thoughts of impending doom.   Past Medical History:  Diagnosis Date  . ALLERGIC RHINITIS 03/02/2007  . ALOPECIA TOTALIS 03/02/2007  . ANA POSITIVE 09/03/2009  . ANOMALY, CONGENITAL, KIDNEY NEC 03/02/2007  . COLONIC POLYPS, HX OF 03/02/2007  . Cough 10/09/2008  . DEGENERATION, LUMBAR/LUMBOSACRAL DISC 03/02/2007  . DERMATITIS, ATOPIC 03/02/2007  . DERMATITIS, SEBORRHEIC 12/04/2007  . DYSPNEA 03/01/2007  . GERD 03/02/2007  . Hidradenitis 03/05/2010  . HYPERTENSION, BENIGN 11/28/2005  . LACTOSE INTOLERANCE 03/02/2007  . NEPHROLITHIASIS, HX OF 03/02/2007  . OTITIS EXTERNA, CHRONIC NEC 03/02/2007  . OVERACTIVE BLADDER 09/03/2009  . Overweight(278.02) 03/02/2007  . PALPITATIONS, RECURRENT 03/02/2007  . PHOTOALLERGIC DERMATITIS 03/02/2007  . PSORIASIS 08/28/2008  . THALASSEMIA NEC 03/02/2007  . Unspecified anemia 10/09/2008    Exam BP 120/70 (BP Location: Left Arm, Patient Position: Sitting, Cuff Size: Normal)   Pulse 60   Temp 98.4 F (36.9 C) (Oral)   Ht 5\' 1"  (1.549 m)   Wt 150 lb (68 kg)   SpO2 95%   BMI 28.34 kg/m  General:  well developed, well nourished, in no apparent distress Heart: RRR, no  bruits, no LE edema Lungs: clear to auscultation, no accessory muscle use Psych: well oriented with normal range of affect and appropriate judgment/insight  Essential hypertension  GAD (generalized anxiety disorder) - Plan: busPIRone (BUSPAR) 7.5 MG tablet  1.  Continue to monitor blood pressure to make sure arm is at heart level.  Continue Norvasc 5 mg daily.  Counseled on diet and exercise.  I rechecked her blood pressure and got 122/64. 2.  Start BuSpar 7.5 mg daily.  We discussed how it is usually at least a twice daily medication but she wants to try once a day.  We discussed starting a SSRI medication but she wanted to have something for more immediate results.  Behavioral health information provided in her paperwork today. F/u in 1 month. The patient voiced understanding and agreement to the plan.  [place], DO 09/04/20  3:20 PM

## 2020-09-04 NOTE — Patient Instructions (Addendum)
Call your insurance company and inquire about Silver Sneakers.   Keep the diet clean and stay active.  Please consider counseling. Contact [phone] to schedule an appointment or inquire about cost/insurance coverage.  Continue checking your blood pressure at home. Make sure the cuff is at the level of your heart and arm is relaxed.   Coping skills Choose 5 that work for you:  Take a deep breath  Count to 20  Read a book  Do a puzzle  Meditate  Bake  Sing  Knit  Garden  Pray  Go outside  Call a friend  Listen to music  Take a walk  Color  Send a note  Take a bath  Watch a movie  Be alone in a quiet place  Pet an animal  Visit a friend  Journal  Exercise  Stretch   Let us know if you need anything.

## 2020-10-05 ENCOUNTER — Other Ambulatory Visit: Payer: Self-pay

## 2020-10-05 ENCOUNTER — Ambulatory Visit (INDEPENDENT_AMBULATORY_CARE_PROVIDER_SITE_OTHER): Payer: Medicare Other | Admitting: Family Medicine

## 2020-10-05 ENCOUNTER — Encounter: Payer: Self-pay | Admitting: Family Medicine

## 2020-10-05 VITALS — BP 124/72 | HR 57 | Temp 98.7°F | Ht 61.0 in | Wt 142.1 lb

## 2020-10-05 DIAGNOSIS — Z1231 Encounter for screening mammogram for malignant neoplasm of breast: Secondary | ICD-10-CM | POA: Diagnosis not present

## 2020-10-05 DIAGNOSIS — F411 Generalized anxiety disorder: Secondary | ICD-10-CM

## 2020-10-05 NOTE — Progress Notes (Signed)
Chief Complaint  Patient presents with  . Follow-up    Subjective Victoria Hale presents for f/u anxiety.  Pt was prescribed BuSpar but did not started because she started to feel good.  She has not had any issues with depression or anxiety since her last visit.  She is not exercising.  She is not following with a counselor or psychologist.  No homicidal or suicidal ideation.  No self-medication.  Past Medical History:  Diagnosis Date  . ALLERGIC RHINITIS 03/02/2007  . ALOPECIA TOTALIS 03/02/2007  . ANA POSITIVE 09/03/2009  . ANOMALY, CONGENITAL, KIDNEY NEC 03/02/2007  . COLONIC POLYPS, HX OF 03/02/2007  . Cough 10/09/2008  . DEGENERATION, LUMBAR/LUMBOSACRAL DISC 03/02/2007  . DERMATITIS, ATOPIC 03/02/2007  . DERMATITIS, SEBORRHEIC 12/04/2007  . DYSPNEA 03/01/2007  . GERD 03/02/2007  . Hidradenitis 03/05/2010  . HYPERTENSION, BENIGN 11/28/2005  . LACTOSE INTOLERANCE 03/02/2007  . NEPHROLITHIASIS, HX OF 03/02/2007  . OTITIS EXTERNA, CHRONIC NEC 03/02/2007  . OVERACTIVE BLADDER 09/03/2009  . Overweight(278.02) 03/02/2007  . PALPITATIONS, RECURRENT 03/02/2007  . PHOTOALLERGIC DERMATITIS 03/02/2007  . PSORIASIS 08/28/2008  . THALASSEMIA NEC 03/02/2007  . Unspecified anemia 10/09/2008     Exam BP 124/72 (BP Location: Left Arm, Patient Position: Sitting, Cuff Size: Normal)   Pulse (!) 57   Temp 98.7 F (37.1 C) (Oral)   Ht 5\' 1"  (1.549 m)   Wt 142 lb 2 oz (64.5 kg)   SpO2 97%   BMI 26.85 kg/m  General:  well developed, well nourished, in no apparent distress Heart: RRR Lungs:  CTAB. No respiratory distress Psych: well oriented with normal range of affect and age-appropriate judgement/insight, alert and oriented x4.  Assessment and Plan  GAD (generalized anxiety disorder)  Encounter for screening mammogram for malignant neoplasm of breast - Plan: MM DIGITAL SCREENING BILATERAL  Okay to hold off of any medication, she would be able to restart within the next year if anything new comes up  with her mood. Follow-up in 6 months for a physical or as needed. The patient voiced understanding and agreement to the plan.  [place], DO 10/05/20 12:57 PM

## 2020-10-05 NOTE — Patient Instructions (Signed)
Call Center for [facility] at [phone] for an appointment.  They are located at [address], [place], [place], [address] (right across the hall from our office).  Keep the diet clean and stay active.  If the mood changes, we can start the buspirone. This is not mandatory though.  Keep the diet clean and stay active.  Let us know if you need anything.

## 2020-10-12 ENCOUNTER — Ambulatory Visit (HOSPITAL_BASED_OUTPATIENT_CLINIC_OR_DEPARTMENT_OTHER)
Admission: RE | Admit: 2020-10-12 | Discharge: 2020-10-12 | Disposition: A | Discharge status: Home / Self Care | Payer: Medicare Other | Source: Ambulatory Visit | Attending: Family Medicine | Admitting: Family Medicine

## 2020-10-12 ENCOUNTER — Encounter (HOSPITAL_BASED_OUTPATIENT_CLINIC_OR_DEPARTMENT_OTHER): Payer: Self-pay

## 2020-10-12 ENCOUNTER — Other Ambulatory Visit: Payer: Self-pay

## 2020-10-12 DIAGNOSIS — Z1231 Encounter for screening mammogram for malignant neoplasm of breast: Secondary | ICD-10-CM | POA: Insufficient documentation

## 2020-10-19 NOTE — Progress Notes (Signed)
Cardiology Office Note:   Date:  10/21/2020  NAME:  Victoria Hale    MRN: [id number] DOB:  [birth year]   PCP:  Sharlene Dory, DO  Cardiologist:  None  Electrophysiologist:  None   Referring MD: Sharlene Dory*   Chief Complaint  Patient presents with  . Follow-up   History of Present Illness:   Victoria Hale is a 70 y.o. female with a hx of HTN who presents for follow-up of palpitations. Monitor normal. Had palpitations that occurred with severely elevated BP.  She reports that she is still having episodes of palpitations.  They occur 2-3 times per week.  Can last 5 to 10 minutes.  She reports no association with food.  She reports they mainly occur while sitting.  She did have episodes while wearing the monitor.  She describes no stress.  We did go over the fact that her monitor showed no arrhythmias.  There is no extra heartbeats to treat.  She has no chest pain or shortness of breath.  She is not exercising.  I recommended regular exercise and proper diet.  I also recommended proper sleep.  Overall her cardiovascular examination is normal.  I see no need for further cardiovascular work-up.  Problem List 1. HTN 2. HLD -T chol 158, HDL 58, LDL 78, TG 131 3. Tobacco abuse   Past Medical History: Past Medical History:  Diagnosis Date  . ALLERGIC RHINITIS 03/02/2007  . ALOPECIA TOTALIS 03/02/2007  . ANA POSITIVE 09/03/2009  . ANOMALY, CONGENITAL, KIDNEY NEC 03/02/2007  . COLONIC POLYPS, HX OF 03/02/2007  . Cough 10/09/2008  . DEGENERATION, LUMBAR/LUMBOSACRAL DISC 03/02/2007  . DERMATITIS, ATOPIC 03/02/2007  . DERMATITIS, SEBORRHEIC 12/04/2007  . DYSPNEA 03/01/2007  . GERD 03/02/2007  . Hidradenitis 03/05/2010  . HYPERTENSION, BENIGN 11/28/2005  . LACTOSE INTOLERANCE 03/02/2007  . NEPHROLITHIASIS, HX OF 03/02/2007  . OTITIS EXTERNA, CHRONIC NEC 03/02/2007  . OVERACTIVE BLADDER 09/03/2009  . Overweight(278.02) 03/02/2007  . PALPITATIONS, RECURRENT 03/02/2007  . PHOTOALLERGIC  DERMATITIS 03/02/2007  . PSORIASIS 08/28/2008  . THALASSEMIA NEC 03/02/2007  . Unspecified anemia 10/09/2008    Past Surgical History: Past Surgical History:  Procedure Laterality Date  . APPENDECTOMY     ?  . CESAREAN SECTION    . exploratory abdominal surgury  1970's  . s/p BTL    . s/p C-section twice      Current Medications: Current Meds  Medication Sig  . amLODipine (NORVASC) 5 MG tablet Take 1 tablet (5 mg total) by mouth daily.  Marland Kitchen atorvastatin (LIPITOR) 10 MG tablet Take 1 tablet daily  . triamcinolone (KENALOG) 0.1 % Apply 1 application topically 2 (two) times daily.  . Vitamin D, Ergocalciferol, (DRISDOL) 1.25 MG (50000 UNIT) CAPS capsule TAKE 1 CAPSULE BY MOUTH EVERY 7 DAYS     Allergies:    Patient has no known allergies.   Social History: Social History   Socioeconomic History  . Marital status: Divorced    Spouse name: Not on file  . Number of children: 2  . Years of education: Not on file  . Highest education level: Not on file  Occupational History  . 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 Smoking status: Former Smoker    Packs/day: 0.25    Years: 15.00    Pack years: 3.75    Quit date: 07/27/1986    Years since quitting: 34.2  . Smokeless tobacco: Never Used  . Tobacco comment: Stopped in 1988 and  started back in 2015  Substance and Sexual Activity  . Alcohol use: Yes    Alcohol/week: 1.0 standard drink    Types: 1 Glasses of wine per week    Comment: glass of wine weekend  . Drug use: No  . Sexual activity: Not Currently    Comment: 1st intercourse- 18, partners- less than 5  Other Topics Concern  . Not on file  Social History Narrative  . Not on file   Social Determinants of Health   Financial Resource Strain: Not on file  Food Insecurity: Not on file  Transportation Needs: Not on file  Physical Activity: Not on file  Stress: Not on file  Social Connections: Not on file     Family History: The patient's  family history includes Breast cancer (age of onset: 81) in her mother; Heart attack (age of onset: 100) in her father; Heart disease (age of onset: 61) in her sister.  ROS:   All other ROS reviewed and negative. Pertinent positives noted in the HPI.     EKGs/Labs/Other Studies Reviewed:   The following studies were personally reviewed by me today:  [facility] 08/05/2020 Impression: 1. No significant arrhythmias detected to explain symptoms.  2. Symptoms occurred with normal sinus rhythm.  3. Rare ectopy.   Recent Labs: 12/19/2019: ALT 12; Hemoglobin 12.7; Platelets 166; TSH 1.12 07/22/2020: BUN 13; Creatinine, Ser 0.85; Potassium 4.2; Sodium 138   Recent Lipid Panel    Component Value Date/Time   CHOL 158 12/19/2019 1046   TRIG 131 12/19/2019 1046   HDL 57 12/19/2019 1046   CHOLHDL 2.8 12/19/2019 1046   VLDL 17.8 12/25/2018 0933   LDLCALC 78 12/19/2019 1046    Physical Exam:   VS:  BP (!) 126/58   Pulse 60   Ht 5' (1.524 m)   Wt 144 lb 6.4 oz (65.5 kg)   SpO2 98%   BMI 28.20 kg/m    Wt Readings from Last 3 Encounters:  10/21/20 144 lb 6.4 oz (65.5 kg)  10/05/20 142 lb 2 oz (64.5 kg)  09/04/20 150 lb (68 kg)    General: Well nourished, well developed, in no acute distress Head: Atraumatic, normal size  Eyes: PEERLA, EOMI  Neck: Supple, no JVD Endocrine: No thryomegaly Cardiac: Normal S1, S2; RRR; no murmurs, rubs, or gallops Lungs: Clear to auscultation bilaterally, no wheezing, rhonchi or rales  Abd: Soft, nontender, no hepatomegaly  Ext: No edema, pulses 2+ Musculoskeletal: No deformities, BUE and BLE strength normal and equal Skin: Warm and dry, no rashes   Neuro: Alert and oriented to person, place, time, and situation, CNII-XII grossly intact, no focal deficits  Psych: Normal mood and affect   ASSESSMENT:   Victoria Hale is a 70 y.o. female who presents for the following: 1. Palpitations   2. Primary hypertension   3. Tobacco abuse     PLAN:   1.  Palpitations -Normal thyroid study.  Normal EKG.  Normal monitor.  Normal cardiovascular exam.  She continues to have symptoms.  Could be stress related.  I recommended regular exercise and proper sleep.  She will work on these.  It is reassuring that everything is normal.  2. Primary hypertension -Well-controlled on Norvasc 5 mg daily.  3. Tobacco abuse -Smoking cessation advised.   Disposition: Return if symptoms worsen or fail to improve.  Medication Adjustments/Labs and Tests Ordered: Current medicines are reviewed at length with the patient today.  Concerns regarding medicines are outlined above.  No orders  of the defined types were placed in this encounter.  No orders of the defined types were placed in this encounter.   Patient Instructions  Medication Instructions:  The current medical regimen is effective;  continue present plan and medications.  *If you need a refill on your cardiac medications before your next appointment, please call your pharmacy*   Follow-Up: At [facility], you and your health needs are our priority.  As part of our continuing mission to provide you with exceptional heart care, we have created designated Provider Care Teams.  These Care Teams include your primary Cardiologist (physician) and Advanced Practice Providers (APPs -  Physician Assistants and Nurse Practitioners) who all work together to provide you with the care you need, when you need it.  We recommend signing up for the patient portal called "MyChart".  Sign up information is provided on this After Visit Summary.  MyChart is used to connect with patients for Virtual Visits (Telemedicine).  Patients are able to view lab/test results, encounter notes, upcoming appointments, etc.  Non-urgent messages can be sent to your provider as well.   To learn more about what you can do with MyChart, go to [email or web address].    Your next appointment:   As needed  The format for your next  appointment:   In Person  Provider:   Lennie Odor, MD        Time Spent with Patient: I have spent a total of 25 minutes with patient reviewing hospital notes, telemetry, EKGs, labs and examining the patient as well as establishing an assessment and plan that was discussed with the patient.  > 50% of time was spent in direct patient care.  Signed, Lenna Gilford. Flora Lipps, MD, [facility]  [facility]  [address], [address] [place], [place] [address] [phone]  10/21/2020 11:43 AM

## 2020-10-20 ENCOUNTER — Other Ambulatory Visit: Payer: Self-pay | Admitting: Family Medicine

## 2020-10-20 DIAGNOSIS — E559 Vitamin D deficiency, unspecified: Secondary | ICD-10-CM

## 2020-10-21 ENCOUNTER — Encounter: Payer: Self-pay | Admitting: Cardiovascular Disease

## 2020-10-21 ENCOUNTER — Other Ambulatory Visit: Payer: Self-pay | Admitting: Family Medicine

## 2020-10-21 ENCOUNTER — Other Ambulatory Visit: Payer: Self-pay

## 2020-10-21 ENCOUNTER — Ambulatory Visit: Payer: Medicare Other | Admitting: Cardiovascular Disease

## 2020-10-21 VITALS — BP 126/58 | HR 60 | Ht 60.0 in | Wt 144.4 lb

## 2020-10-21 DIAGNOSIS — R002 Palpitations: Secondary | ICD-10-CM

## 2020-10-21 DIAGNOSIS — Z720 Tobacco use: Secondary | ICD-10-CM

## 2020-10-21 DIAGNOSIS — I10 Essential (primary) hypertension: Secondary | ICD-10-CM

## 2020-10-21 NOTE — Patient Instructions (Signed)
Medication Instructions:  The current medical regimen is effective;  continue present plan and medications.  *If you need a refill on your cardiac medications before your next appointment, please call your pharmacy*    Follow-Up: At [facility], you and your health needs are our priority.  As part of our continuing mission to provide you with exceptional heart care, we have created designated Provider Care Teams.  These Care Teams include your primary Cardiologist (physician) and Advanced Practice Providers (APPs -  Physician Assistants and Nurse Practitioners) who all work together to provide you with the care you need, when you need it.  We recommend signing up for the patient portal called "MyChart".  Sign up information is provided on this After Visit Summary.  MyChart is used to connect with patients for Virtual Visits (Telemedicine).  Patients are able to view lab/test results, encounter notes, upcoming appointments, etc.  Non-urgent messages can be sent to your provider as well.   To learn more about what you can do with MyChart, go to [email or web address].    Your next appointment:   As needed  The format for your next appointment:   In Person  Provider:   Tahoma O'Neal, MD      

## 2020-12-16 ENCOUNTER — Other Ambulatory Visit (HOSPITAL_COMMUNITY): Payer: Self-pay

## 2021-01-18 ENCOUNTER — Other Ambulatory Visit: Payer: Self-pay | Admitting: Internal Medicine

## 2021-01-18 NOTE — Telephone Encounter (Signed)
Please refill as per office routine med refill policy (all routine meds refilled for 3 mo or monthly per pt preference up to one year from last visit, then month to month grace period for 3 mo, then further med refills will have to be denied)  

## 2021-01-20 ENCOUNTER — Telehealth: Payer: Self-pay | Admitting: Family Medicine

## 2021-01-20 DIAGNOSIS — E559 Vitamin D deficiency, unspecified: Secondary | ICD-10-CM

## 2021-01-22 ENCOUNTER — Other Ambulatory Visit: Payer: Self-pay | Admitting: Family Medicine

## 2021-01-22 DIAGNOSIS — E559 Vitamin D deficiency, unspecified: Secondary | ICD-10-CM

## 2021-01-22 MED ORDER — VITAMIN D (ERGOCALCIFEROL) 1.25 MG (50000 UNIT) PO CAPS: 50000.0000 [IU] | ORAL_CAPSULE | ORAL | 0 refills | Status: DC

## 2021-01-22 NOTE — Telephone Encounter (Signed)
Refill done  Patient contacted and scheduled lab appointment.

## 2021-01-22 NOTE — Telephone Encounter (Signed)
Does she need labs first before refilling.  Seems to be future labs ordered

## 2021-01-26 ENCOUNTER — Other Ambulatory Visit: Payer: Self-pay | Admitting: Internal Medicine

## 2021-01-26 ENCOUNTER — Other Ambulatory Visit (INDEPENDENT_AMBULATORY_CARE_PROVIDER_SITE_OTHER): Payer: Medicare Other

## 2021-01-26 ENCOUNTER — Other Ambulatory Visit: Payer: Self-pay

## 2021-01-26 DIAGNOSIS — E559 Vitamin D deficiency, unspecified: Secondary | ICD-10-CM

## 2021-01-26 LAB — VITAMIN D 25 HYDROXY (VIT D DEFICIENCY, FRACTURES): VITD: 54.42 ng/mL (ref 30.00–100.00)

## 2021-02-03 ENCOUNTER — Other Ambulatory Visit: Payer: Self-pay | Admitting: Family Medicine

## 2021-02-16 DIAGNOSIS — H524 Presbyopia: Secondary | ICD-10-CM | POA: Diagnosis not present

## 2021-02-16 DIAGNOSIS — H5203 Hypermetropia, bilateral: Secondary | ICD-10-CM | POA: Diagnosis not present

## 2021-02-16 DIAGNOSIS — H2513 Age-related nuclear cataract, bilateral: Secondary | ICD-10-CM | POA: Diagnosis not present

## 2021-02-16 DIAGNOSIS — H52223 Regular astigmatism, bilateral: Secondary | ICD-10-CM | POA: Diagnosis not present

## 2021-03-31 ENCOUNTER — Other Ambulatory Visit (INDEPENDENT_AMBULATORY_CARE_PROVIDER_SITE_OTHER): Payer: Self-pay | Admitting: Otolaryngology

## 2021-03-31 DIAGNOSIS — L400 Psoriasis vulgaris: Secondary | ICD-10-CM | POA: Diagnosis not present

## 2021-04-13 ENCOUNTER — Encounter: Payer: Self-pay | Admitting: Family Medicine

## 2021-04-13 ENCOUNTER — Ambulatory Visit (INDEPENDENT_AMBULATORY_CARE_PROVIDER_SITE_OTHER): Payer: Medicare Other | Admitting: Family Medicine

## 2021-04-13 ENCOUNTER — Other Ambulatory Visit: Payer: Self-pay

## 2021-04-13 VITALS — BP 129/71 | HR 60 | Temp 98.5°F | Ht 60.0 in | Wt 143.1 lb

## 2021-04-13 DIAGNOSIS — Z23 Encounter for immunization: Secondary | ICD-10-CM

## 2021-04-13 DIAGNOSIS — L408 Other psoriasis: Secondary | ICD-10-CM

## 2021-04-13 DIAGNOSIS — Z0000 Encounter for general adult medical examination without abnormal findings: Secondary | ICD-10-CM

## 2021-04-13 DIAGNOSIS — Z1211 Encounter for screening for malignant neoplasm of colon: Secondary | ICD-10-CM | POA: Diagnosis not present

## 2021-04-13 LAB — LIPID PANEL
Cholesterol: 163 mg/dL (ref 0–200)
HDL: 58.5 mg/dL (ref >39.00)
LDL Cholesterol: 87 mg/dL (ref 0–99)
NonHDL: 104.05
Total CHOL/HDL Ratio: 3
Triglycerides: 85 mg/dL (ref 0.0–149.0)
VLDL: 17 mg/dL (ref 0.0–40.0)

## 2021-04-13 LAB — COMPREHENSIVE METABOLIC PANEL
ALT: 7 U/L (ref 0–35)
AST: 13 U/L (ref 0–37)
Albumin: 4.1 g/dL (ref 3.5–5.2)
Alkaline Phosphatase: 100 U/L (ref 39–117)
BUN: 9 mg/dL (ref 6–23)
CO2: 33 mEq/L — ABNORMAL HIGH (ref 19–32)
Calcium: 9.2 mg/dL (ref 8.4–10.5)
Chloride: 103 mEq/L (ref 96–112)
Creatinine, Ser: 0.7 mg/dL (ref 0.40–1.20)
GFR: 87.38 mL/min (ref >60.00)
Glucose, Bld: 85 mg/dL (ref 70–99)
Potassium: 4.8 mEq/L (ref 3.5–5.1)
Sodium: 139 mEq/L (ref 135–145)
Total Bilirubin: 0.6 mg/dL (ref 0.2–1.2)
Total Protein: 7.2 g/dL (ref 6.0–8.3)

## 2021-04-13 MED ORDER — TRIAMCINOLONE ACETONIDE 0.1 % EX CREA: 1.0000 "application " | TOPICAL_CREAM | Freq: Two times a day (BID) | CUTANEOUS | 0 refills | Status: DC

## 2021-04-13 NOTE — Progress Notes (Signed)
Chief Complaint  Patient presents with   Annual Exam     Well Woman Victoria Hale is here for a complete physical.   Her last physical was >1 year ago.  Current diet: in general, diet is healthy overall. Current exercise: active in yard/house. Weight is stable and she denies daytime fatigue. Seatbelt? Yes  Health Maintenance Colonoscopy- Due Shingrix- No DEXA- Yes Mammogram- Yes Tetanus- Yes Pneumonia- No Hep C screen- Yes  Past Medical History:  Diagnosis Date   ALLERGIC RHINITIS 03/02/2007   ALOPECIA TOTALIS 03/02/2007   ANA POSITIVE 09/03/2009   ANOMALY, CONGENITAL, KIDNEY NEC 03/02/2007   COLONIC POLYPS, HX OF 03/02/2007   Cough 10/09/2008   DEGENERATION, LUMBAR/LUMBOSACRAL DISC 03/02/2007   DERMATITIS, ATOPIC 03/02/2007   DERMATITIS, SEBORRHEIC 12/04/2007   DYSPNEA 03/01/2007   GERD 03/02/2007   Hidradenitis 03/05/2010   HYPERTENSION, BENIGN 11/28/2005   LACTOSE INTOLERANCE 03/02/2007   NEPHROLITHIASIS, HX OF 03/02/2007   OTITIS EXTERNA, CHRONIC NEC 03/02/2007   OVERACTIVE BLADDER 09/03/2009   Overweight(278.02) 03/02/2007   PALPITATIONS, RECURRENT 03/02/2007   PHOTOALLERGIC DERMATITIS 03/02/2007   PSORIASIS 08/28/2008   THALASSEMIA NEC 03/02/2007   Unspecified anemia 10/09/2008     Past Surgical History:  Procedure Laterality Date   APPENDECTOMY     ?   CESAREAN SECTION     exploratory abdominal surgury  1970's   s/p BTL     s/p C-section twice      Medications  Current Outpatient Medications on File Prior to Visit  Medication Sig Dispense Refill   amLODipine (NORVASC) 5 MG tablet Take 1 tablet (5 mg total) by mouth daily. 90 tablet 1   atorvastatin (LIPITOR) 10 MG tablet TAKE 1 TABLET BY MOUTH DAILY 90 tablet 1   Allergies No Known Allergies  Review of Systems: Constitutional:  no fevers Eye:  no recent significant change in vision Ears:  No changes in hearing Nose/Mouth/Throat:  no complaints of nasal congestion, no sore throat Cardiovascular: no chest  pain Respiratory:  No shortness of breath Gastrointestinal:  No change in bowel habits GU:  Female: negative for dysuria Integumentary:  no abnormal skin lesions reported Neurologic:  no headaches Endocrine:  denies unexplained weight changes  Exam BP 129/71   Pulse 60   Temp 98.5 F (36.9 C) (Oral)   Ht 5' (1.524 m)   Wt 143 lb 2 oz (64.9 kg)   SpO2 98%   BMI 27.95 kg/m  General:  well developed, well nourished, in no apparent distress Skin:  no significant moles, warts, or growths Head:  no masses, lesions, or tenderness Eyes:  pupils equal and round, sclera anicteric without injection Ears:  canals without lesions, TMs shiny without retraction, no obvious effusion, no erythema Nose:  nares patent, septum midline, mucosa normal, and no drainage or sinus tenderness Throat/Pharynx:  lips and gingiva without lesion; tongue and uvula midline; non-inflamed pharynx; no exudates or postnasal drainage Neck: neck supple without adenopathy, thyromegaly, or masses Lungs:  clear to auscultation, breath sounds equal bilaterally, no respiratory distress Cardio:  regular rate and rhythm, no bruits or LE edema Abdomen:  abdomen soft, nontender; bowel sounds normal; no masses or organomegaly Genital: Deferred Neuro:  gait normal; deep tendon reflexes normal and symmetric Psych: well oriented with normal range of affect and appropriate judgment/insight  Assessment and Plan  Well adult exam - Plan: Comprehensive metabolic panel, Lipid panel  PSORIASIS - Plan: triamcinolone cream (KENALOG) 0.1 %  Screen for colon cancer - Plan: Ambulatory referral to  Gastroenterology   Well 70 y.o. female. Counseled on diet and exercise. Other orders as above. Flu and PCV20 today. Shingrix and Covid bivalent booster rec'd.  Refer back to GI for CCS.  Follow up in 6 mo. The patient voiced understanding and agreement to the plan.  Jilda Roche [place], DO 04/13/21 11:07 AM

## 2021-04-13 NOTE — Patient Instructions (Addendum)
Give [place] 2-3 business days to get the results of your labs back.   I recommend getting the updated bivalent covid vaccination booster at your convenience.   The new Shingrix vaccine (for shingles) is a 2 shot series. It can make people feel low energy, achy and almost like they have the flu for 48 hours after injection. Please plan accordingly when deciding on when to get this shot. Call your pharmacy to get this. The second shot of the series is less severe regarding the side effects, but it still lasts 48 hours.   If you do not hear anything about your referral in the next 1-2 weeks, call our office and ask for an update.  Let us know if you need anything.

## 2021-04-13 NOTE — Addendum Note (Signed)
Addended by: Scharlene Gloss B on: 04/13/2021 11:11 AM   Modules accepted: Orders

## 2021-06-11 ENCOUNTER — Ambulatory Visit (INDEPENDENT_AMBULATORY_CARE_PROVIDER_SITE_OTHER): Payer: Medicare Other

## 2021-06-11 VITALS — Ht 60.0 in | Wt 143.0 lb

## 2021-06-11 DIAGNOSIS — Z7689 Persons encountering health services in other specified circumstances: Secondary | ICD-10-CM | POA: Diagnosis not present

## 2021-06-11 DIAGNOSIS — Z0000 Encounter for general adult medical examination without abnormal findings: Secondary | ICD-10-CM | POA: Diagnosis not present

## 2021-06-11 DIAGNOSIS — L400 Psoriasis vulgaris: Secondary | ICD-10-CM | POA: Diagnosis not present

## 2021-06-11 DIAGNOSIS — Z780 Asymptomatic menopausal state: Secondary | ICD-10-CM

## 2021-06-11 DIAGNOSIS — L4050 Arthropathic psoriasis, unspecified: Secondary | ICD-10-CM | POA: Diagnosis not present

## 2021-06-11 NOTE — Progress Notes (Signed)
Subjective:   Victoria HarbourMildred G Hale is a 71 y.o. female who presents for an Initial Medicare Annual Wellness Visit.  I connected with Victoria CroftMildred today by telephone and verified that I am speaking with the correct person using two identifiers. Location patient: home Location provider: work Persons participating in the virtual visit: patient, Engineer, civil (consulting)nurse.    I discussed the limitations, risks, security and privacy concerns of performing an evaluation and management service by telephone and the availability of in person appointments. I also discussed with the patient that there may be a patient responsible charge related to this service. The patient expressed understanding and verbally consented to this telephonic visit.    Interactive audio and video telecommunications were attempted between this provider and patient, however failed, due to patient having technical difficulties OR patient did not have access to video capability.  We continued and completed visit with audio only.  Some vital signs may be absent or patient reported.   Time Spent with patient on telephone encounter: 25 minutes   Review of Systems     Cardiac Risk Factors include: advanced age (>1555men, 45>65 women);hypertension;dyslipidemia;sedentary lifestyle     Objective:    Today's Vitals   06/11/21 1140  Weight: 143 lb (64.9 kg)  Height: 5' (1.524 m)   Body mass index is 27.93 kg/m.  Advanced Directives 06/11/2021  Does Patient Have a Medical Advance Directive? No  Would patient like information on creating a medical advance directive? Yes (MAU/Ambulatory/Procedural Areas - Information given)    Current Medications (verified) Outpatient Encounter Medications as of 06/11/2021  Medication Sig   amLODipine (NORVASC) 5 MG tablet Take 1 tablet (5 mg total) by mouth daily.   atorvastatin (LIPITOR) 10 MG tablet TAKE 1 TABLET BY MOUTH DAILY   triamcinolone cream (KENALOG) 0.1 % Apply 1 application topically 2 (two) times daily.    No facility-administered encounter medications on file as of 06/11/2021.    Allergies (verified) Patient has no known allergies.   History: Past Medical History:  Diagnosis Date   ALLERGIC RHINITIS 03/02/2007   ALOPECIA TOTALIS 03/02/2007   ANA POSITIVE 09/03/2009   ANOMALY, CONGENITAL, KIDNEY NEC 03/02/2007   COLONIC POLYPS, HX OF 03/02/2007   Cough 10/09/2008   DEGENERATION, LUMBAR/LUMBOSACRAL DISC 03/02/2007   DERMATITIS, ATOPIC 03/02/2007   DERMATITIS, SEBORRHEIC 12/04/2007   DYSPNEA 03/01/2007   GERD 03/02/2007   Hidradenitis 03/05/2010   HYPERTENSION, BENIGN 11/28/2005   LACTOSE INTOLERANCE 03/02/2007   NEPHROLITHIASIS, HX OF 03/02/2007   OTITIS EXTERNA, CHRONIC NEC 03/02/2007   OVERACTIVE BLADDER 09/03/2009   Overweight(278.02) 03/02/2007   PALPITATIONS, RECURRENT 03/02/2007   PHOTOALLERGIC DERMATITIS 03/02/2007   PSORIASIS 08/28/2008   THALASSEMIA NEC 03/02/2007   Unspecified anemia 10/09/2008   Past Surgical History:  Procedure Laterality Date   APPENDECTOMY     ?   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  Tobacco Use   Smoking status: Former    Packs/day: 0.25    Years: 15.00    Pack years: 3.75    Types: Cigarettes    Quit date: 07/27/1986    Years since quitting: 34.8   Smokeless tobacco: Never   Tobacco comments:    Stopped in 1988 and started back in 2015  Substance and Sexual Activity   Alcohol use: Yes    Alcohol/week: 1.0 standard drink    Types: 1 Glasses of wine per  week    Comment: glass of wine weekend   Drug use: No   Sexual activity: Not Currently    Comment: 1st intercourse- 18, partners- less than 5  Other Topics Concern   Not on file  Social History Narrative   Not on file   Social Determinants of Health   Financial Resource Strain: Low Risk    Difficulty of Paying Living Expenses: Not hard at all  Food Insecurity: No Food Insecurity   Worried About Programme researcher, broadcasting/film/video in the Last Year: Never true   Ran Out of Food in the Last Year: Never true  Transportation Needs: No Transportation Needs   Lack of Transportation (Medical): No   Lack of Transportation (Non-Medical): No  Physical Activity: Inactive   Days of Exercise per Week: 0 days   Minutes of Exercise per Session: 0 min  Stress: No Stress Concern Present   Feeling of Stress : Not at all  Social Connections: Moderately Isolated   Frequency of Communication with Friends and Family: More than three times a week   Frequency of Social Gatherings with Friends and Family: More than three times a week   Attends Religious Services: More than 4 times per year   Active Member of [employer] or Organizations: No   Attends Banker Meetings: Never   Marital Status: Divorced    Tobacco Counseling Counseling given: Not Answered Tobacco comments: Stopped in 1988 and started back in 2015   Clinical Intake:  Pre-visit preparation completed: Yes  Pain : No/denies pain     BMI - recorded: 27.93 Nutritional Status: BMI 25 -29 Overweight Nutritional Risks: None  How often do you need to have someone help you when you read instructions, pamphlets, or other written materials from your doctor or pharmacy?: 1 - Never  Diabetic?No  Interpreter Needed?: No  Information entered by :: Thomasenia Sales LPN   Activities of Daily Living In your present state of health, do you have any difficulty performing the following activities: 06/11/2021  Hearing? N  Vision? N  Difficulty  concentrating or making decisions? N  Walking or climbing stairs? N  Dressing or bathing? N  Doing errands, shopping? N  Preparing Food and eating ? N  Using the Toilet? N  In the past six months, have you accidently leaked urine? N  Do you have problems with loss of bowel control? N  Managing your Medications? N  Managing your Finances? N  Housekeeping or managing your Housekeeping? N  Some recent data might be hidden    Patient Care Team: Sharlene Dory, DO as PCP - General (Family Medicine)  Indicate any recent Medical Services you may have received from other than [facility] providers in the past year (date may be approximate).     Assessment:   This is a routine wellness examination for Victoria Hale.  Hearing/Vision screen Hearing Screening - Comments:: No issues Vision Screening - Comments:: Last eye exam-01/2021  Dietary issues and exercise activities discussed: Current Exercise Habits: The patient does not participate in regular exercise at present, Exercise limited by: None identified   Goals Addressed             This Visit's Progress    Patient Stated       Increase activity, drink more water & eat healthier.       Depression Screen PHQ 2/9 Scores 06/11/2021 10/05/2020 12/26/2019 06/26/2019 12/24/2018 11/01/2017 10/26/2016  PHQ - 2 Score 0 0 0 0 0 0 0  PHQ- 9 Score - - - - - 0 -    Fall Risk Fall Risk  06/11/2021 10/05/2020 12/26/2019 12/26/2019 06/26/2019  Falls in the past year? 0 0 0 0 0  Number falls in past yr: 0 0 - 0 -  Injury with Fall? 0 0 - 0 -  Risk for fall due to : - No Fall Risks - No Fall Risks -  Follow up Falls prevention discussed Falls evaluation completed - Falls evaluation completed -    FALL RISK PREVENTION PERTAINING TO THE HOME:  Any stairs in or around the home? No  Home free of loose throw rugs in walkways, pet beds, electrical cords, etc? Yes  Adequate lighting in your home to reduce risk of falls? Yes   ASSISTIVE DEVICES UTILIZED TO  PREVENT FALLS:  Life alert? No  Use of a cane, walker or w/c? No  Grab bars in the bathroom? Yes  Shower chair or bench in shower? Yes  Elevated toilet seat or a handicapped toilet? No   TIMED UP AND GO:  Was the test performed? No . Phone visit   Cognitive Function:Normal cognitive status assessed by this Nurse Health Advisor. No abnormalities found.          Immunizations Immunization History  Administered Date(s) Administered   Fluad Quad(high Dose 65+) 04/13/2021   Influenza, High Dose Seasonal PF 04/19/2016   PFIZER(Purple Top)SARS-COV-2 Vaccination 07/15/2019, 08/06/2019   PNEUMOCOCCAL CONJUGATE-20 04/13/2021   Pneumococcal Conjugate-13 11/01/2017   Td 08/29/2006   Tdap 11/01/2017    TDAP status: Up to date  Flu Vaccine status: Up to date  Pneumococcal vaccine status: Up to date  Covid-19 vaccine status: Information provided on how to obtain vaccines.   Qualifies for Shingles Vaccine? Yes   Zostavax completed No   Shingrix Completed?: No.    Education has been provided regarding the importance of this vaccine. Patient has been advised to call insurance company to determine out of pocket expense if they have not yet received this vaccine. Advised may also receive vaccine at local pharmacy or Health Dept. Verbalized acceptance and understanding.  Screening Tests Health Maintenance  Topic Date Due   Zoster Vaccines- Shingrix (1 of 2) Never done   COLONOSCOPY (Pts 45-3958yrs Insurance coverage will need to be confirmed)  11/19/2017   COVID-19 Vaccine (3 - Booster for Pfizer series) 10/01/2019   MAMMOGRAM  10/13/2022   TETANUS/TDAP  11/02/2027   Pneumonia Vaccine 4565+ Years old  Completed   INFLUENZA VACCINE  Completed   DEXA SCAN  Completed   Hepatitis C Screening  Completed   HPV VACCINES  Aged Out    Health Maintenance  Health Maintenance Due  Topic Date Due   Zoster Vaccines- Shingrix (1 of 2) Never done   COLONOSCOPY (Pts 45-1858yrs Insurance coverage  will need to be confirmed)  11/19/2017   COVID-19 Vaccine (3 - Booster for Pfizer series) 10/01/2019    Colorectal cancer screening: Due-Patient  to call GI to schedule.  Mammogram status: Completed bilateral 10/12/2020. Repeat every year  Bone Density status: Ordered today. Pt provided with contact info and advised to call to schedule appt.  Lung Cancer Screening: (Low Dose CT Chest recommended if Age 41-80 years, 30 pack-year currently smoking OR have quit w/in 15years.) does not qualify.     Additional Screening:  Hepatitis C Screening:  Completed 10/02/2015  Vision Screening: Recommended annual ophthalmology exams for early detection of glaucoma and other disorders of the eye. Is the patient up to date with their annual eye exam?  Yes  Who is the provider or what is the name of the office in which the patient attends annual eye exams? Pt unsure of name   Dental Screening: Recommended annual dental exams for proper oral hygiene  Community Resource Referral / Chronic Care Management: CRR required this visit?  No   CCM required this visit?  No      Plan:     I have personally reviewed and noted the following in the patients chart:   Medical and social history Use of alcohol, tobacco or illicit drugs  Current medications and supplements including opioid prescriptions. Patient is not currently taking opioid prescriptions. Functional ability and status Nutritional status Physical activity Advanced directives List of other physicians Hospitalizations, surgeries, and ER visits in previous 12 months Vitals Screenings to include cognitive, depression, and falls Referrals and appointments  In addition, I have reviewed and discussed with patient certain preventive protocols, quality metrics, and best practice recommendations. A written personalized care plan for preventive services as well as general preventive health recommendations were provided to patient.   Due to this  being a telephonic visit, the after visit summary with patients personalized plan was offered to patient via mail or my-chart.  Patient would like to access on my-chart.   Roanna Raider, LPN   01/10/4817  Nurse Health Advisor  Nurse Notes: None

## 2021-06-11 NOTE — Patient Instructions (Signed)
Victoria Hale , Thank you for taking time to complete your Medicare Wellness Visit. I appreciate your ongoing commitment to your health goals. Please review the following plan we discussed and let me know if I can assist you in the future.   Screening recommendations/referrals: Colonoscopy: Per our conversation, you will call GI to schedule. Mammogram: Completed 11/12/2020-Due 10/12/2021 Bone Density: Ordered today. Someone will call you to schedule. Recommended yearly ophthalmology/optometry visit for glaucoma screening and checkup Recommended yearly dental visit for hygiene and checkup  Vaccinations: Influenza vaccine: Up to date Pneumococcal vaccine: Up to date Tdap vaccine: Up to date Shingles vaccine: May obtain vaccine at your local pharmacy. Covid-19:Booster available at the pharmacy  Advanced directives: Information mailed today  Conditions/risks identified: See problem list  Next appointment: Follow up in one year for your annual wellness visit    Preventive Care 65 Years and Older, Female Preventive care refers to lifestyle choices and visits with your health care provider that can promote health and wellness. What does preventive care include? A yearly physical exam. This is also called an annual well check. Dental exams once or twice a year. Routine eye exams. Ask your health care provider how often you should have your eyes checked. Personal lifestyle choices, including: Daily care of your teeth and gums. Regular physical activity. Eating a healthy diet. Avoiding tobacco and drug use. Limiting alcohol use. Practicing safe sex. Taking low-dose aspirin every day. Taking vitamin and mineral supplements as recommended by your health care provider. What happens during an annual well check? The services and screenings done by your health care provider during your annual well check will depend on your age, overall health, lifestyle risk factors, and family history of  disease. Counseling  Your health care provider may ask you questions about your: Alcohol use. Tobacco use. Drug use. Emotional well-being. Home and relationship well-being. Sexual activity. Eating habits. History of falls. Memory and ability to understand (cognition). Work and work Statistician. Reproductive health. Screening  You may have the following tests or measurements: Height, weight, and BMI. Blood pressure. Lipid and cholesterol levels. These may be checked every 5 years, or more frequently if you are over 69 years old. Skin check. Lung cancer screening. You may have this screening every year starting at age 23 if you have a 30-pack-year history of smoking and currently smoke or have quit within the past 15 years. Fecal occult blood test (FOBT) of the stool. You may have this test every year starting at age 51. Flexible sigmoidoscopy or colonoscopy. You may have a sigmoidoscopy every 5 years or a colonoscopy every 10 years starting at age 73. Hepatitis C blood test. Hepatitis B blood test. Sexually transmitted disease (STD) testing. Diabetes screening. This is done by checking your blood sugar (glucose) after you have not eaten for a while (fasting). You may have this done every 1-3 years. Bone density scan. This is done to screen for osteoporosis. You may have this done starting at age 78. Mammogram. This may be done every 1-2 years. Talk to your health care provider about how often you should have regular mammograms. Talk with your health care provider about your test results, treatment options, and if necessary, the need for more tests. Vaccines  Your health care provider may recommend certain vaccines, such as: Influenza vaccine. This is recommended every year. Tetanus, diphtheria, and acellular pertussis (Tdap, Td) vaccine. You may need a Td booster every 10 years. Zoster vaccine. You may need this after age 98. Pneumococcal  13-valent conjugate (PCV13) vaccine. One  dose is recommended after age 15. Pneumococcal polysaccharide (PPSV23) vaccine. One dose is recommended after age 53. Talk to your health care provider about which screenings and vaccines you need and how often you need them. This information is not intended to replace advice given to you by your health care provider. Make sure you discuss any questions you have with your health care provider. Document Released: 06/12/2015 Document Revised: 02/03/2016 Document Reviewed: 03/17/2015 Elsevier Interactive Patient Education  2017 [employer] Prevention in the Home Falls can cause injuries. They can happen to people of all ages. There are many things you can do to make your home safe and to help prevent falls. What can I do on the outside of my home? Regularly fix the edges of walkways and driveways and fix any cracks. Remove anything that might make you trip as you walk through a door, such as a raised step or threshold. Trim any bushes or trees on the path to your home. Use bright outdoor lighting. Clear any walking paths of anything that might make someone trip, such as rocks or tools. Regularly check to see if handrails are loose or broken. Make sure that both sides of any steps have handrails. Any raised decks and porches should have guardrails on the edges. Have any leaves, snow, or ice cleared regularly. Use sand or salt on walking paths during winter. Clean up any spills in your garage right away. This includes oil or grease spills. What can I do in the bathroom? Use night lights. Install grab bars by the toilet and in the tub and shower. Do not use towel bars as grab bars. Use non-skid mats or decals in the tub or shower. If you need to sit down in the shower, use a plastic, non-slip stool. Keep the floor dry. Clean up any water that spills on the floor as soon as it happens. Remove soap buildup in the tub or shower regularly. Attach bath mats securely with double-sided  non-slip rug tape. Do not have throw rugs and other things on the floor that can make you trip. What can I do in the bedroom? Use night lights. Make sure that you have a light by your bed that is easy to reach. Do not use any sheets or blankets that are too big for your bed. They should not hang down onto the floor. Have a firm chair that has side arms. You can use this for support while you get dressed. Do not have throw rugs and other things on the floor that can make you trip. What can I do in the kitchen? Clean up any spills right away. Avoid walking on wet floors. Keep items that you use a lot in easy-to-reach places. If you need to reach something above you, use a strong step stool that has a grab bar. Keep electrical cords out of the way. Do not use floor polish or wax that makes floors slippery. If you must use wax, use non-skid floor wax. Do not have throw rugs and other things on the floor that can make you trip. What can I do with my stairs? Do not leave any items on the stairs. Make sure that there are handrails on both sides of the stairs and use them. Fix handrails that are broken or loose. Make sure that handrails are as long as the stairways. Check any carpeting to make sure that it is firmly attached to the stairs. Fix any carpet  that is loose or worn. Avoid having throw rugs at the top or bottom of the stairs. If you do have throw rugs, attach them to the floor with carpet tape. Make sure that you have a light switch at the top of the stairs and the bottom of the stairs. If you do not have them, ask someone to add them for you. What else can I do to help prevent falls? Wear shoes that: Do not have high heels. Have rubber bottoms. Are comfortable and fit you well. Are closed at the toe. Do not wear sandals. If you use a stepladder: Make sure that it is fully opened. Do not climb a closed stepladder. Make sure that both sides of the stepladder are locked into place. Ask  someone to hold it for you, if possible. Clearly mark and make sure that you can see: Any grab bars or handrails. First and last steps. Where the edge of each step is. Use tools that help you move around (mobility aids) if they are needed. These include: Canes. Walkers. Scooters. Crutches. Turn on the lights when you go into a dark area. Replace any light bulbs as soon as they burn out. Set up your furniture so you have a clear path. Avoid moving your furniture around. If any of your floors are uneven, fix them. If there are any pets around you, be aware of where they are. Review your medicines with your doctor. Some medicines can make you feel dizzy. This can increase your chance of falling. Ask your doctor what other things that you can do to help prevent falls. This information is not intended to replace advice given to you by your health care provider. Make sure you discuss any questions you have with your health care provider. Document Released: 03/12/2009 Document Revised: 10/22/2015 Document Reviewed: 06/20/2014 Elsevier Interactive Patient Education  2017 [employer].

## 2021-06-28 ENCOUNTER — Other Ambulatory Visit: Payer: Self-pay

## 2021-06-28 ENCOUNTER — Ambulatory Visit (HOSPITAL_BASED_OUTPATIENT_CLINIC_OR_DEPARTMENT_OTHER)
Admission: RE | Admit: 2021-06-28 | Discharge: 2021-06-28 | Disposition: A | Discharge status: Home / Self Care | Payer: Medicare Other | Source: Ambulatory Visit | Attending: Family Medicine | Admitting: Family Medicine

## 2021-06-28 DIAGNOSIS — Z780 Asymptomatic menopausal state: Secondary | ICD-10-CM | POA: Insufficient documentation

## 2021-06-29 DIAGNOSIS — Z79899 Other long term (current) drug therapy: Secondary | ICD-10-CM | POA: Diagnosis not present

## 2021-06-29 DIAGNOSIS — L400 Psoriasis vulgaris: Secondary | ICD-10-CM | POA: Diagnosis not present

## 2021-07-21 ENCOUNTER — Other Ambulatory Visit: Payer: Self-pay | Admitting: Family Medicine

## 2021-07-30 IMAGING — MG MM DIGITAL SCREENING BILAT W/ TOMO AND CAD
8 series · 8 of 24 positions shown · non-contrast
Comparison: Previous exam(s).

CLINICAL DATA: Screening.

EXAM:
DIGITAL SCREENING BILATERAL MAMMOGRAM WITH TOMOSYNTHESIS AND CAD
TECHNIQUE: Bilateral screening digital craniocaudal and mediolateral oblique
mammograms were obtained. Bilateral screening digital breast
tomosynthesis was performed. The images were evaluated with
computer-aided detection.

[R MLO synth-2D]
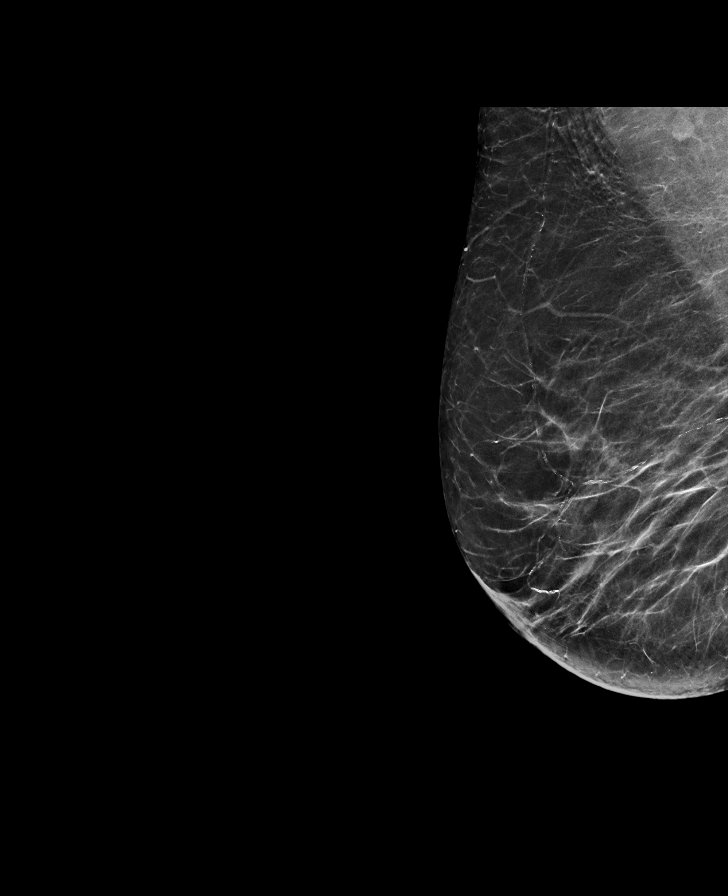

[L CC synth-2D]
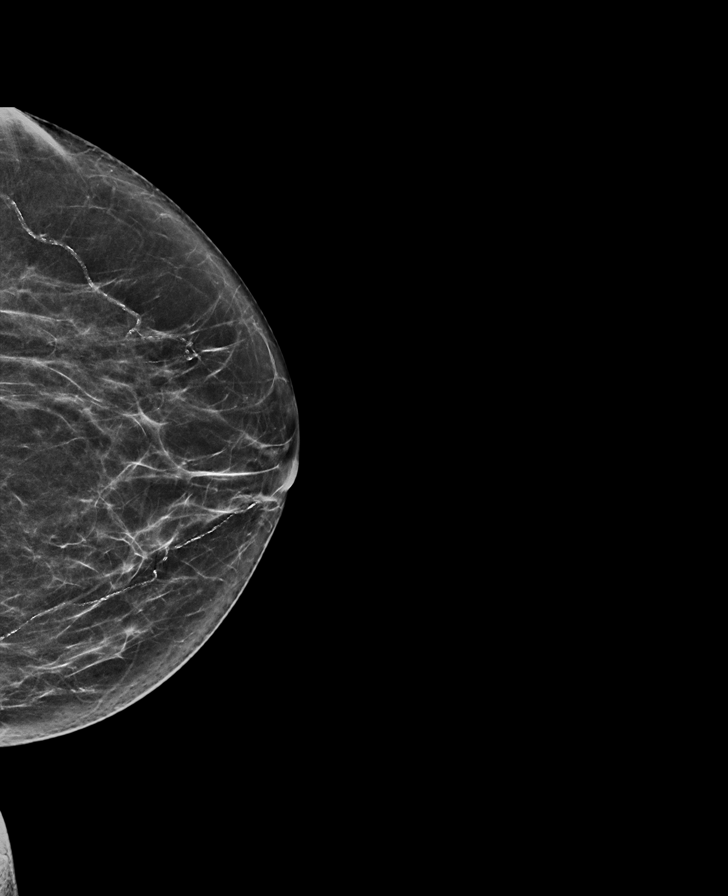

[L MLO synth-2D]
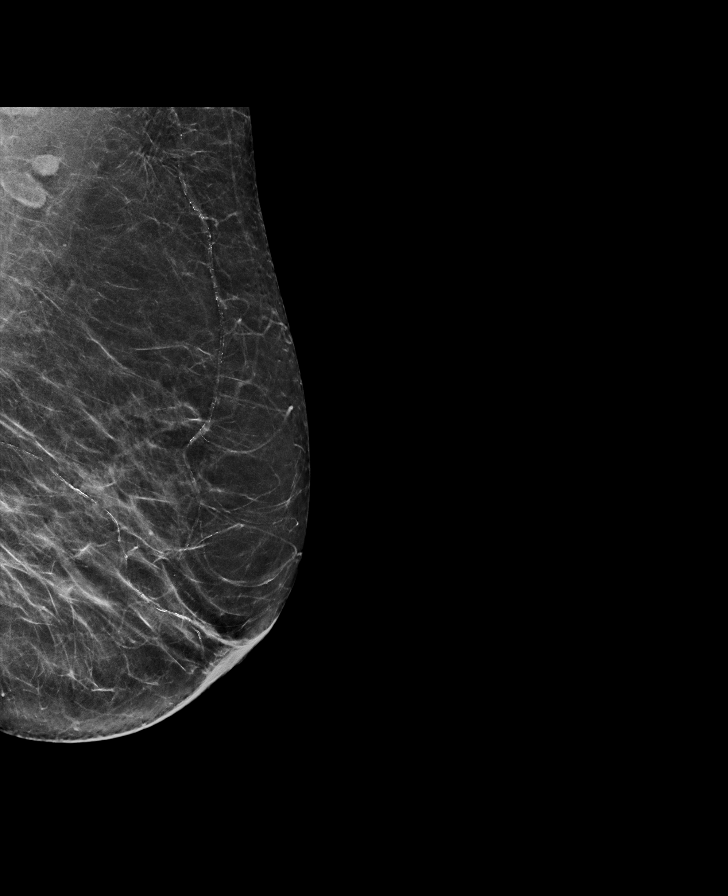

[R CC synth-2D]
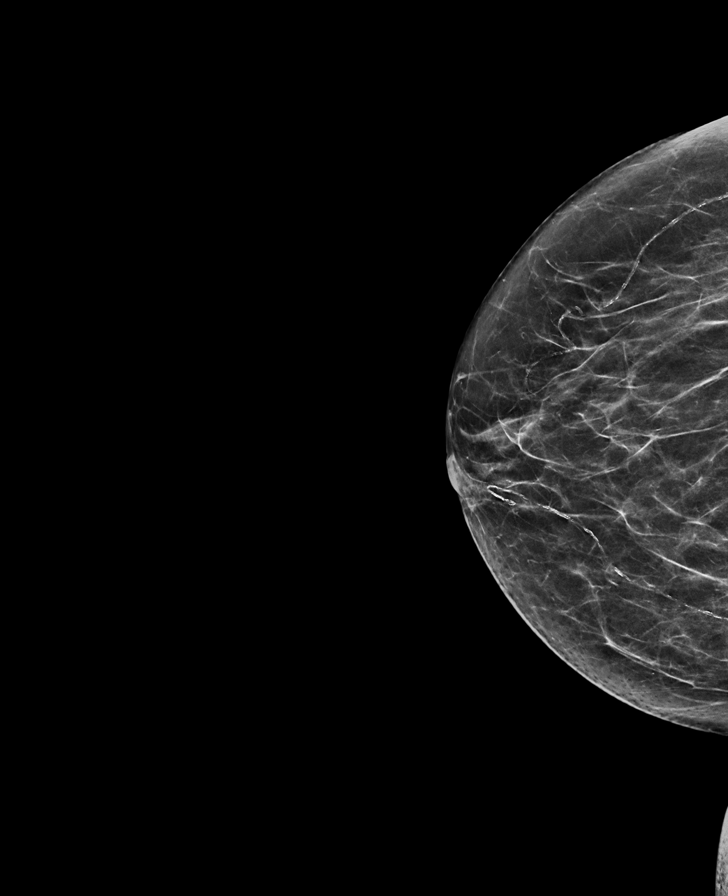

[L CC tomo · tomo slice 31/61.0]
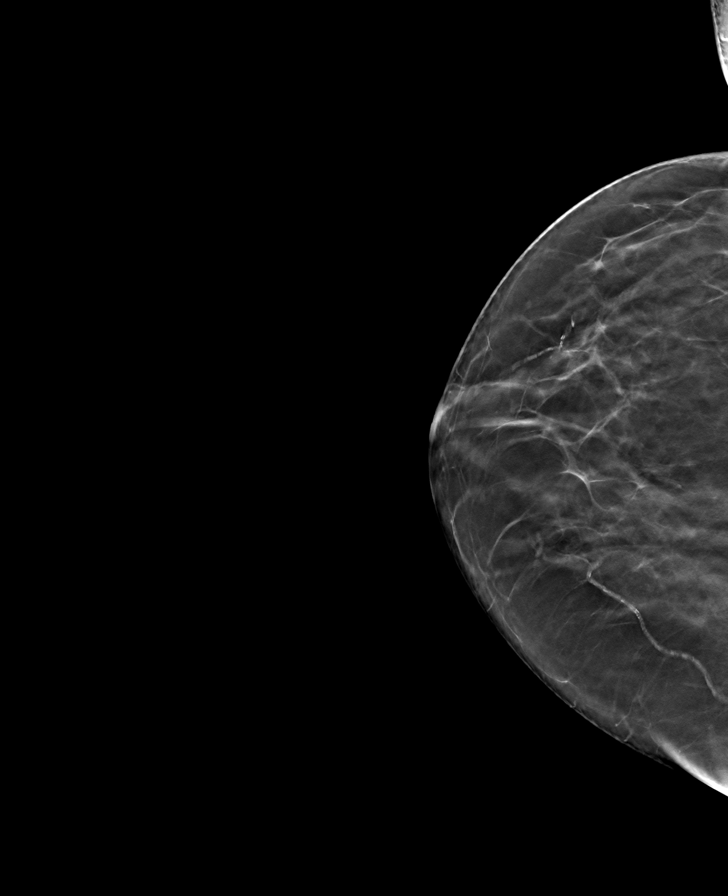

[L MLO tomo · tomo slice 34/67.0]
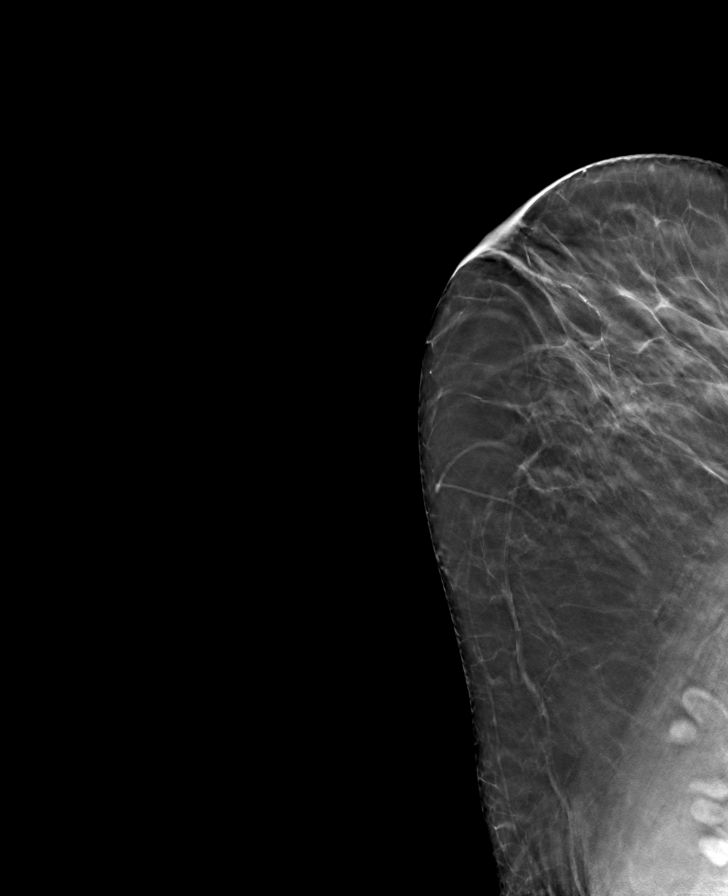

[R CC tomo · tomo slice 29/58.0]
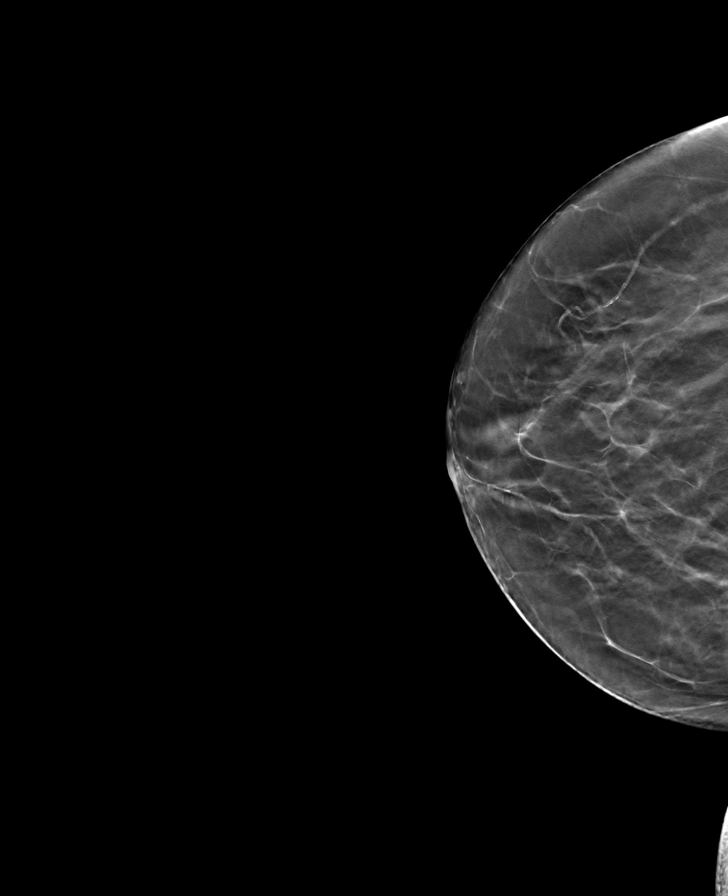

[R MLO tomo · tomo slice 34/67.0]
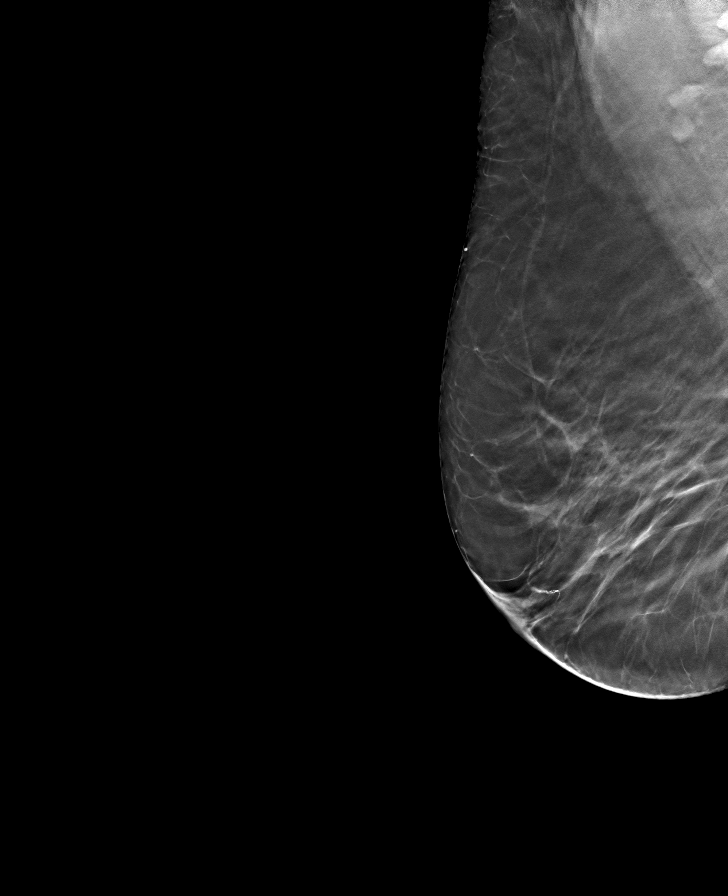

[8 of 24 positions shown; findings below may reference images not displayed]

ACR Breast Density Category b: There are scattered areas of
fibroglandular density.
FINDINGS: There are no findings suspicious for malignancy. The images were
evaluated with computer-aided detection.
IMPRESSION: No mammographic evidence of malignancy. A result letter of this
screening mammogram will be mailed directly to the patient.

RECOMMENDATION:
Screening mammogram in one year. (Code:[id number]-[id number])

BI-RADS CATEGORY  1: Negative.

## 2021-08-06 ENCOUNTER — Telehealth: Payer: Self-pay | Admitting: Family Medicine

## 2021-08-06 MED ORDER — ATORVASTATIN CALCIUM 10 MG PO TABS: ORAL_TABLET | ORAL | 1 refills | Status: DC

## 2021-08-06 NOTE — Telephone Encounter (Signed)
Medication:  ?atorvastatin (LIPITOR) 10 MG tablet [[id number]]  ?  ? ?Has the patient contacted their pharmacy? No. ?(If no, request that the patient contact the pharmacy for the refill.) ?(If yes, when and what did the pharmacy advise?) ? ?  ? ?Preferred Pharmacy (with phone number or street name):  ?[id number] - [place], [place] - [address] AT [facility] OF [address]  ?[address], [place] [address]  ?Phone:  [phone]  Fax:  [phone]  ?  ? ?Agent: Please be advised that RX refills may take up to 3 business days. We ask that you follow-up with your pharmacy.  ?

## 2021-08-06 NOTE — Telephone Encounter (Signed)
Sent in

## 2021-08-10 ENCOUNTER — Encounter: Payer: Self-pay | Admitting: Family Medicine

## 2021-08-10 ENCOUNTER — Ambulatory Visit (INDEPENDENT_AMBULATORY_CARE_PROVIDER_SITE_OTHER): Payer: Medicare Other | Admitting: Family Medicine

## 2021-08-10 ENCOUNTER — Other Ambulatory Visit (HOSPITAL_BASED_OUTPATIENT_CLINIC_OR_DEPARTMENT_OTHER): Payer: Self-pay

## 2021-08-10 VITALS — BP 121/71 | HR 59 | Temp 98.3°F | Ht 61.0 in | Wt 141.0 lb

## 2021-08-10 DIAGNOSIS — R7303 Prediabetes: Secondary | ICD-10-CM | POA: Diagnosis not present

## 2021-08-10 DIAGNOSIS — I10 Essential (primary) hypertension: Secondary | ICD-10-CM | POA: Diagnosis not present

## 2021-08-10 DIAGNOSIS — Z1211 Encounter for screening for malignant neoplasm of colon: Secondary | ICD-10-CM

## 2021-08-10 DIAGNOSIS — R109 Unspecified abdominal pain: Secondary | ICD-10-CM

## 2021-08-10 LAB — BASIC METABOLIC PANEL
BUN: 12 mg/dL (ref 6–23)
CO2: 33 mEq/L — ABNORMAL HIGH (ref 19–32)
Calcium: 9.4 mg/dL (ref 8.4–10.5)
Chloride: 102 mEq/L (ref 96–112)
Creatinine, Ser: 0.77 mg/dL (ref 0.40–1.20)
GFR: 77.76 mL/min (ref >60.00)
Glucose, Bld: 94 mg/dL (ref 70–99)
Potassium: 4.6 mEq/L (ref 3.5–5.1)
Sodium: 139 mEq/L (ref 135–145)

## 2021-08-10 LAB — HEMOGLOBIN A1C: Hgb A1c MFr Bld: 6.2 % (ref 4.6–6.5)

## 2021-08-10 MED ORDER — AMLODIPINE BESYLATE 5 MG PO TABS
5.0000 mg | ORAL_TABLET | Freq: Every day | ORAL | 2 refills | Status: DC
  Filled 2021-08-10: qty 90, 90d supply, fill #0

## 2021-08-10 NOTE — Patient Instructions (Addendum)
Heat (pad or rice pillow in microwave) over affected area, 10-15 minutes twice daily.  ? ?Ice/cold pack over area for 10-15 min twice daily. ? ?OK to take Tylenol 1000 mg (2 extra strength tabs) or 975 mg (3 regular strength tabs) every 6 hours as needed. ? ?Call your pharmacy to get the shingles shot.  ? ?If you do not hear anything about your referral in the next 1-2 weeks, call our office and ask for an update. ? ?Let us know if you need anything. ? ? ?Mid-Back Strain Rehab ?It is normal to feel mild stretching, pulling, tightness, or discomfort as you do these exercises, but you should stop right away if you feel sudden pain or your pain gets worse.  ? ?Stretching and range of motion exercises ?This exercise warms up your muscles and joints and improves the movement and flexibility of your back and shoulders. This exercise also help to relieve pain. ?Exercise A: Chest and spine stretch ? ?  ?Lie down on your back on a firm surface. ?Roll a towel or a small blanket so it is about 4 inches (10 cm) in diameter. ?Put the towel lengthwise under the middle of your back so it is under your spine, but not under your shoulder blades. ?To increase the stretch, you may put your hands behind your head and let your elbows fall to your sides. ?Hold for 30 seconds. ?Repeat exercise 2 times. Complete this exercise 3 times per week. ? ?Strengthening exercises ?These exercises build strength and endurance in your back and your shoulder blade muscles. Endurance is the ability to use your muscles for a long time, even after they get tired. ?Exercise B: Alternating arm and leg raises ? ?  ?Get on your hands and knees on a firm surface. If you are on a hard floor, you may want to use padding to cushion your knees, such as an exercise mat. ?Line up your arms and legs. Your hands should be below your shoulders, and your knees should be below your hips. ?Lift your left leg behind you. At the same time, raise your right arm and  straighten it in front of you. ?Do not lift your leg higher than your hip. ?Do not lift your arm higher than your shoulder. ?Keep your abdominal and back muscles tight. ?Keep your hips facing the ground. ?Do not arch your back. ?Keep your balance carefully, and do not hold your breath. ?Hold for 3 seconds. ?Slowly return to the starting position and repeat with your right leg and your left arm. ?Repeat 2 times. Complete this exercise 3 times per week. ?Exercise C: Straight arm rows (shoulder extension)  ? ?  ?Stand with your feet shoulder width apart. ?Secure an exercise band to a stable object in front of you so the band is at or above shoulder height. ?Hold one end of the exercise band in each hand. ?Straighten your elbows and lift your hands up to shoulder height. ?Step back, away from the secured end of the exercise band, until the band stretches. ?Squeeze your shoulder blades together and pull your hands down to the sides of your thighs. Stop when your hands are straight down by your sides. Do not let your hands go behind your body. ?Hold for 3 seconds. ?Slowly return to the starting position. ?Repeat 2 times. Complete this exercise 3 times per week. ?Exercise D: Shoulder external rotation, prone ?Lie on your abdomen on a firm bed so your left / right forearm hangs over the  edge of the bed and your upper arm is on the bed, straight out from your body. ?Your elbow should be bent. ?Your palm should be facing your feet. ?If instructed, hold a 5 lb weight in your hand. ?Squeeze your shoulder blade toward the middle of your back. Do not let your shoulder lift toward your ear. ?Keep your elbow bent in an "L" shape (90 degrees) while you slowly move your forearm up toward the ceiling. Move your forearm up to the height of the bed, toward your head. ?Your upper arm should not move. ?At the top of the movement, your palm should face the floor. ?Hold for 3 seconds. ?Slowly return to the starting position and relax your  muscles. ?Repeat 3 times. Complete this exercise 3 times per week. ?Exercise E: Scapular retraction and external rotation, rowing ? ?  ?Sit in a stable chair without armrests, or stand. ?Secure an exercise band to a stable object in front of you so it is at shoulder height. ?Hold one end of the exercise band in each hand. ?Bring your arms out straight in front of you. ?Step back, away from the secured end of the exercise band, until the band stretches. ?Pull the band backward. As you do this, bend your elbows and squeeze your shoulder blades together, but avoid letting the rest of your body move. Do not let your shoulders lift up toward your ears. ?Stop when your elbows are at your sides or slightly behind your body. ?Hold for 5 seconds. ?Slowly straighten your arms to return to the starting position. ?Repeat 2 times. Complete this exercise 3 times per week. ?Posture and body mechanics ? ?  ?Body mechanics refers to the movements and positions of your body while you do your daily activities. Posture is part of body mechanics. Good posture and healthy body mechanics can help to relieve stress in your body's tissues and joints. Good posture means that your spine is in its natural S-curve position (your spine is neutral), your shoulders are pulled back slightly, and your head is not tipped forward. The following are general guidelines for applying improved posture and body mechanics to your everyday activities. ?Standing ? ?  ?When standing, keep your spine neutral and your feet about hip-width apart. Keep a slight bend in your knees. Your ears, shoulders, and hips should line up. ?When you do a task in which you lean forward while standing in one place for a long time, place one foot up on a stable object that is 2-4 inches (5-10 cm) high, such as a footstool. This helps keep your spine neutral. ?Sitting ? ?  ?When sitting, keep your spine neutral and keep your feet flat on the floor. Use a footrest, if necessary, and  keep your thighs parallel to the floor. Avoid rounding your shoulders, and avoid tilting your head forward. ?When working at a desk or a computer, keep your desk at a height where your hands are slightly lower than your elbows. Slide your chair under your desk so you are close enough to maintain good posture. ?When working at a computer, place your monitor at a height where you are looking straight ahead and you do not have to tilt your head forward or downward to look at the screen. ?Resting ? ?  ?When lying down and resting, avoid positions that are most painful for you. ?If you have pain with activities such as sitting, bending, stooping, or squatting (flexion-based activities), lie in a position in which your body  does not bend very much. For example, avoid curling up on your side with your arms and knees near your chest (fetal position). ?If you have pain with activities such as standing for a long time or reaching with your arms (extension-based activities), lie with your spine in a neutral position and bend your knees slightly. Try the following positions: ?Lying on your side with a pillow between your knees. ?Lying on your back with a pillow under your knees. ?  ?Lifting ? ?  ?When lifting objects, keep your feet at least shoulder-width apart and tighten your abdominal muscles. ?Bend your knees and hips and keep your spine neutral. It is important to lift using the strength of your legs, not your back. Do not lock your knees straight out. ?Always ask for help to lift heavy or awkward objects. ?Make sure you discuss any questions you have with your health care provider. ? ?

## 2021-08-10 NOTE — Progress Notes (Signed)
Musculoskeletal Exam ? ?Patient: Victoria Hale DOB: [birth year] ? ?DOS: 08/10/2021 ? ?SUBJECTIVE: ? ?Chief Complaint:  ? ?Chief Complaint  ?Patient presents with  ? Flank Pain  ?  Right ?Patient uncertain if on amlodipine 2.5 or 5?  ? ? ?Victoria Hale is a 71 y.o.  female for evaluation and treatment of R side pain.  ? ?Onset:  2 weeks ago. No inj or change in activity.  ?Location: R side ?Character:  dull  ?Progression of issue:  is unchanged ?Associated symptoms: No bruising,  ?Treatment: to date has been none.   ?Neurovascular symptoms: no ? ?Past Medical History:  ?Diagnosis Date  ? ALLERGIC RHINITIS 03/02/2007  ? ALOPECIA TOTALIS 03/02/2007  ? ANA POSITIVE 09/03/2009  ? ANOMALY, CONGENITAL, KIDNEY NEC 03/02/2007  ? COLONIC POLYPS, HX OF 03/02/2007  ? DEGENERATION, LUMBAR/LUMBOSACRAL DISC 03/02/2007  ? DERMATITIS, ATOPIC 03/02/2007  ? DYSPNEA 03/01/2007  ? GERD 03/02/2007  ? Hidradenitis 03/05/2010  ? HYPERTENSION, BENIGN 11/28/2005  ? LACTOSE INTOLERANCE 03/02/2007  ? NEPHROLITHIASIS, HX OF 03/02/2007  ? OTITIS EXTERNA, CHRONIC NEC 03/02/2007  ? OVERACTIVE BLADDER 09/03/2009  ? Overweight(278.02) 03/02/2007  ? PALPITATIONS, RECURRENT 03/02/2007  ? PHOTOALLERGIC DERMATITIS 03/02/2007  ? PSORIASIS 08/28/2008  ? THALASSEMIA NEC 03/02/2007  ? Unspecified anemia 10/09/2008  ? ? ?Objective: ?VITAL SIGNS: BP 121/71   Pulse (!) 59   Temp 98.3 ?F (36.8 ?C) (Oral)   Ht 5\' 1"  (1.549 m)   Wt 141 lb (64 kg)   SpO2 99%   BMI 26.64 kg/m?  ?Constitutional: Well formed, well developed. No acute distress. ?Thorax & Lungs: No accessory muscle use ?Musculoskeletal: R side/low back .   ?Normal active range of motion: yes.   ?Normal passive range of motion: yes ?Tenderness to palpation: yes over lateral R rib cage wrapping around from posterior CVA to anterior axillary line ?Deformity: no ?Ecchymosis: no ?Tests positive:  ?Tests negative: ?Neurologic: Normal sensory function. No focal deficits noted. DTR's equal and symmetric  in LE's. No clonus. ?Psychiatric: Normal mood. Age appropriate judgment and insight. Alert & oriented x 3.   ? ?Assessment: ? ?Flank pain - Plan: Basic metabolic panel ? ?Essential hypertension - Plan: amLODipine (NORVASC) 5 MG tablet ? ?Prediabetes - Plan: Hemoglobin A1c ? ?Screening for colon cancer - Plan: Ambulatory referral to Gastroenterology ? ?Plan: ?Stretches/exercises, heat, ice, Tylenol. Pt anxious about kidneys, will ck labs.  ?CCS-refer GI.  ?F/u prn. ?The patient voiced understanding and agreement to the plan. ? ? ? Camino Tassajara, DO ?08/10/21  ?10:39 AM ? ?

## 2021-08-11 ENCOUNTER — Telehealth: Payer: Self-pay | Admitting: Family Medicine

## 2021-08-11 NOTE — Telephone Encounter (Signed)
Patient informed of PCP response, ?She is on the 2.5 and perfers to stay there. ?Updated her list. ?

## 2021-08-11 NOTE — Telephone Encounter (Signed)
Pt states Wendling upped the dosage of her Amlodopine to 5 MG  ? ? ?Pt would like to know why since she was under the impression everything was great within ov  ? ? ?Please advise  ?

## 2021-08-16 ENCOUNTER — Other Ambulatory Visit (HOSPITAL_BASED_OUTPATIENT_CLINIC_OR_DEPARTMENT_OTHER): Payer: Self-pay

## 2021-08-16 MED ORDER — ZOSTER VAC RECOMB ADJUVANTED 50 MCG/0.5ML IM SUSR
INTRAMUSCULAR | 1 refills | Status: DC
  Filled 2021-08-16: qty 1, 1d supply, fill #0

## 2021-09-15 DIAGNOSIS — Z79899 Other long term (current) drug therapy: Secondary | ICD-10-CM | POA: Diagnosis not present

## 2021-09-15 DIAGNOSIS — L309 Dermatitis, unspecified: Secondary | ICD-10-CM | POA: Diagnosis not present

## 2021-09-15 DIAGNOSIS — L400 Psoriasis vulgaris: Secondary | ICD-10-CM | POA: Diagnosis not present

## 2021-10-11 ENCOUNTER — Encounter: Payer: Self-pay | Admitting: Family Medicine

## 2021-10-11 ENCOUNTER — Ambulatory Visit (INDEPENDENT_AMBULATORY_CARE_PROVIDER_SITE_OTHER): Payer: Medicare Other | Admitting: Family Medicine

## 2021-10-11 ENCOUNTER — Encounter: Payer: Self-pay | Admitting: Gastroenterology

## 2021-10-11 VITALS — BP 120/80 | HR 59 | Temp 97.9°F | Ht 61.0 in | Wt 150.5 lb

## 2021-10-11 DIAGNOSIS — R7303 Prediabetes: Secondary | ICD-10-CM | POA: Diagnosis not present

## 2021-10-11 DIAGNOSIS — I10 Essential (primary) hypertension: Secondary | ICD-10-CM | POA: Diagnosis not present

## 2021-10-11 DIAGNOSIS — Z1211 Encounter for screening for malignant neoplasm of colon: Secondary | ICD-10-CM

## 2021-10-11 DIAGNOSIS — E782 Mixed hyperlipidemia: Secondary | ICD-10-CM

## 2021-10-11 MED ORDER — AMLODIPINE BESYLATE 2.5 MG PO TABS: 2.5000 mg | ORAL_TABLET | Freq: Every day | ORAL | 2 refills | Status: DC

## 2021-10-11 NOTE — Patient Instructions (Addendum)
Keep the diet clean and stay active.  If you do not hear anything about your referral in the next 1-2 weeks, call our office and ask for an update.  Let us know if you need anything.  

## 2021-10-11 NOTE — Progress Notes (Signed)
Chief Complaint  ?Patient presents with  ? Follow-up  ? ? ?Subjective ?Victoria Hale is a 71 y.o. female who presents for hypertension follow up. ?She does not monitor home blood pressures. ?She is compliant with medication- Norvasc 2.5 mg/d.  ?Patient has these side effects of medication: none ?She is adhering to a healthy diet overall. ?Current exercise: none ?No Cp or SOB. ? ?Hyperlipidemia ?Patient presents for dyslipidemia follow up. ?Currently being treated with Lipitor 10 mg/d and compliance with treatment thus far has been good. ?She denies myalgias. ?Diet/exercise as above.  ?The patient is not known to have coexisting coronary artery disease. ?  ?Past Medical History:  ?Diagnosis Date  ? ALLERGIC RHINITIS 03/02/2007  ? ALOPECIA TOTALIS 03/02/2007  ? ANA POSITIVE 09/03/2009  ? ANOMALY, CONGENITAL, KIDNEY NEC 03/02/2007  ? COLONIC POLYPS, HX OF 03/02/2007  ? DEGENERATION, LUMBAR/LUMBOSACRAL DISC 03/02/2007  ? DERMATITIS, ATOPIC 03/02/2007  ? DYSPNEA 03/01/2007  ? GERD 03/02/2007  ? Hidradenitis 03/05/2010  ? HYPERTENSION, BENIGN 11/28/2005  ? LACTOSE INTOLERANCE 03/02/2007  ? NEPHROLITHIASIS, HX OF 03/02/2007  ? OTITIS EXTERNA, CHRONIC NEC 03/02/2007  ? OVERACTIVE BLADDER 09/03/2009  ? Overweight(278.02) 03/02/2007  ? PALPITATIONS, RECURRENT 03/02/2007  ? PHOTOALLERGIC DERMATITIS 03/02/2007  ? PSORIASIS 08/28/2008  ? THALASSEMIA NEC 03/02/2007  ? Unspecified anemia 10/09/2008  ? ? ?Exam ?BP 120/80   Pulse (!) 59   Temp 97.9 ?F (36.6 ?C) (Oral)   Ht 5\' 1"  (1.549 m)   Wt 150 lb 8 oz (68.3 kg)   SpO2 99%   BMI 28.44 kg/m?  ?General:  well developed, well nourished, in no apparent distress ?Heart: RRR, no bruits, no LE edema ?Lungs: clear to auscultation, no accessory muscle use ?Psych: well oriented with normal range of affect and appropriate judgment/insight ? ?Essential hypertension - Plan: CBC ? ?Mixed hyperlipidemia - Plan: Comprehensive metabolic panel, Lipid panel ? ?Prediabetes - Plan: Hemoglobin  A1c ? ?Screen for colon cancer - Plan: Ambulatory referral to Gastroenterology ? ?Chronic, stable. Cont Norvasc 2.5 mg/d. Counseled on diet and exercise. ?Chronic, stable. Cont Lipitor 10 mg/d.  ?F/u in 6 mo for CPE, labs 1 week prior. ?The patient voiced understanding and agreement to the plan. ? ? , DO ?10/11/21  ?11:18 AM ? ?

## 2021-11-03 ENCOUNTER — Ambulatory Visit (AMBULATORY_SURGERY_CENTER): Payer: Self-pay | Admitting: *Deleted

## 2021-11-03 VITALS — Ht 61.0 in | Wt 147.0 lb

## 2021-11-03 DIAGNOSIS — Z86010 Personal history of colonic polyps: Secondary | ICD-10-CM

## 2021-11-03 MED ORDER — NA SULFATE-K SULFATE-MG SULF 17.5-3.13-1.6 GM/177ML PO SOLN: 2.0000 | Freq: Once | ORAL | 0 refills | Status: AC

## 2021-11-03 NOTE — Progress Notes (Signed)
No egg or soy allergy known to patient  No issues known to pt with past sedation with any surgeries or procedures Patient denies ever being told they had issues or difficulty with intubation  No FH of Malignant Hyperthermia Pt is not on diet pills Pt is not on  home 02  Pt is not on blood thinners  Pt has some issues with constipation, instructed to take Miralax daily for one week prior to procedure. No A fib or A flutter  Pt instructed to use [employer] or GoodRx for a price reduction on prep   PV completed over the phone. Pt verified name, DOB, address and insurance during PV today.  Pt given instruction packet with copy of consent form to read and sign after procedure and instructions explained and questions answered. Pt encouraged to call with questions or issues.

## 2021-11-19 ENCOUNTER — Encounter: Payer: Self-pay | Admitting: Gastroenterology

## 2021-11-21 ENCOUNTER — Encounter: Payer: Self-pay | Admitting: Certified Registered Nurse Anesthetist

## 2021-11-22 ENCOUNTER — Telehealth: Payer: Self-pay | Admitting: Gastroenterology

## 2021-11-22 NOTE — Telephone Encounter (Signed)
Patient called would like to discuss the prep medication options with a nurse.

## 2021-11-22 NOTE — Telephone Encounter (Signed)
Salt free diet-  she is concerned about having the sodium sulfate  in her Suprep- Instructed she is safe to use the SUprep as all the preps contain sodium sulfate  She asked about BP meds   instructed her to take BP meds TUesday as directed and by 6 am WED prior to her colon   Pt verbalized understanding

## 2021-11-23 ENCOUNTER — Encounter: Payer: Self-pay | Admitting: Certified Registered Nurse Anesthetist

## 2021-11-24 ENCOUNTER — Ambulatory Visit (AMBULATORY_SURGERY_CENTER): Payer: Medicare Other | Admitting: Gastroenterology

## 2021-11-24 ENCOUNTER — Encounter: Payer: Self-pay | Admitting: Gastroenterology

## 2021-11-24 VITALS — BP 144/65 | HR 61 | Temp 98.7°F | Resp 11 | Ht 61.0 in | Wt 147.0 lb

## 2021-11-24 DIAGNOSIS — Z09 Encounter for follow-up examination after completed treatment for conditions other than malignant neoplasm: Secondary | ICD-10-CM | POA: Diagnosis not present

## 2021-11-24 DIAGNOSIS — Z86010 Personal history of colonic polyps: Secondary | ICD-10-CM

## 2021-11-24 MED ORDER — SODIUM CHLORIDE 0.9 % IV SOLN: 500.0000 mL | Freq: Once | INTRAVENOUS | Status: DC

## 2021-11-24 NOTE — Patient Instructions (Signed)
Handouts given on hemorrhoids and diverticulosis.  Resume previous diet.  Continue present medications.  YOU HAD AN ENDOSCOPIC PROCEDURE TODAY AT THE [facility]:   Refer to the procedure report that was given to you for any specific questions about what was found during the examination.  If the procedure report does not answer your questions, please call your gastroenterologist to clarify.  If you requested that your care partner not be given the details of your procedure findings, then the procedure report has been included in a sealed envelope for you to review at your convenience later.  YOU SHOULD EXPECT: Some feelings of bloating in the abdomen. Passage of more gas than usual.  Walking can help get rid of the air that was put into your GI tract during the procedure and reduce the bloating. If you had a lower endoscopy (such as a colonoscopy or flexible sigmoidoscopy) you may notice spotting of blood in your stool or on the toilet paper. If you underwent a bowel prep for your procedure, you may not have a normal bowel movement for a few days.  Please Note:  You might notice some irritation and congestion in your nose or some drainage.  This is from the oxygen used during your procedure.  There is no need for concern and it should clear up in a day or so.  SYMPTOMS TO REPORT IMMEDIATELY:  Following lower endoscopy (colonoscopy or flexible sigmoidoscopy):  Excessive amounts of blood in the stool  Significant tenderness or worsening of abdominal pains  Swelling of the abdomen that is new, acute  Fever of 100F or higher  For urgent or emergent issues, a gastroenterologist can be reached at any hour by calling [phone]. Do not use MyChart messaging for urgent concerns.    DIET:  We do recommend a small meal at first, but then you may proceed to your regular diet.  Drink plenty of fluids but you should avoid alcoholic beverages for 24 hours.  ACTIVITY:  You should plan to  take it easy for the rest of today and you should NOT DRIVE or use heavy machinery until tomorrow (because of the sedation medicines used during the test).    FOLLOW UP: Our staff will call the number listed on your records the next business day following your procedure.  We will call around 7:15- 8:00 am to check on you and address any questions or concerns that you may have regarding the information given to you following your procedure. If we do not reach you, we will leave a message.  If you develop any symptoms (ie: fever, flu-like symptoms, shortness of breath, cough etc.) before then, please call [phone].  If you test positive for Covid 19 in the 2 weeks post procedure, please call and report this information to [place].    If any biopsies were taken you will be contacted by phone or by letter within the next 1-3 weeks.  Please call us at [phone] if you have not heard about the biopsies in 3 weeks.    SIGNATURES/CONFIDENTIALITY: You and/or your care partner have signed paperwork which will be entered into your electronic medical record.  These signatures attest to the fact that that the information above on your After Visit Summary has been reviewed and is understood.  Full responsibility of the confidentiality of this discharge information lies with you and/or your care-partner.

## 2021-11-24 NOTE — Progress Notes (Signed)
Pt's states no medical or surgical changes since previsit or office visit. 

## 2021-11-24 NOTE — Op Note (Signed)
[facility] Patient Name: Victoria Hale Procedure Date: 11/24/2021 8:37 AM MRN: [id number] Endoscopist: Viviann Spare P. Adela Lank , MD Age: 71 Referring MD:  Date of Birth: 1951/03/25 Gender: Female Account #: [id number] Procedure:                Colonoscopy Indications:              High risk colon cancer surveillance: Personal                            history of colonic polyps remotely - last exam                            10/2007 Medicines:                Monitored Anesthesia Care Procedure:                Pre-Anesthesia Assessment:                           - Prior to the procedure, a History and Physical                            was performed, and patient medications and                            allergies were reviewed. The patient's tolerance of                            previous anesthesia was also reviewed. The risks                            and benefits of the procedure and the sedation                            options and risks were discussed with the patient.                            All questions were answered, and informed consent                            was obtained. Prior Anticoagulants: The patient has                            taken no previous anticoagulant or antiplatelet                            agents. ASA Grade Assessment: II - A patient with                            mild systemic disease. After reviewing the risks                            and benefits, the patient was deemed in  satisfactory condition to undergo the procedure.                           After obtaining informed consent, the colonoscope                            was passed under direct vision. Throughout the                            procedure, the patient's blood pressure, pulse, and                            oxygen saturations were monitored continuously. The                            Olympus PCF-[id number] (#[id number]) Colonoscope was                             introduced through the anus and advanced to the the                            cecum, identified by appendiceal orifice and                            ileocecal valve. The colonoscopy was performed                            without difficulty. The patient tolerated the                            procedure well. The quality of the bowel                            preparation was good. The ileocecal valve,                            appendiceal orifice, and rectum were photographed. Scope In: 8:46:59 AM Scope Out: 9:07:13 AM Scope Withdrawal Time: 0 hours 14 minutes 38 seconds  Total Procedure Duration: 0 hours 20 minutes 14 seconds  Findings:                 The perianal and digital rectal examinations were                            normal.                           A few small-mouthed diverticula were found in the                            sigmoid colon.                           Internal hemorrhoids were found during  retroflexion. The hemorrhoids were small.                           The exam was otherwise without abnormality. Complications:            No immediate complications. Estimated blood loss:                            None. Estimated Blood Loss:     Estimated blood loss: none. Impression:               - Diverticulosis in the sigmoid colon.                           - Internal hemorrhoids.                           - The examination was otherwise normal.                           - No polyps Recommendation:           - Patient has a contact number available for                            emergencies. The signs and symptoms of potential                            delayed complications were discussed with the                            patient. Return to normal activities tomorrow.                            Written discharge instructions were provided to the                            patient.                           - Resume  previous diet.                           - Continue present medications.                           - Patient would not be due for another colonoscopy                            for 10 years, at that point she will be 71 years                            old, an age at which routine surveillance is not                            recommended. In this light, no further surveillance  colonoscopy is warranted [name]. [name], MD 11/24/2021 9:12:04 AM This report has been signed electronically.

## 2021-11-24 NOTE — Progress Notes (Signed)
Report given to PACU, vss 

## 2021-11-24 NOTE — Progress Notes (Signed)
[facility] History and Physical   Primary Care Physician:  Sharlene Dory, DO   Reason for Procedure:   History of colon polyps  Plan:    colonoscopy     HPI: Victoria Hale is a 71 y.o. female  here for colonoscopy screening - last exam 10/2007 Patient denies any bowel symptoms at this time. No family history of colon cancer known. Otherwise feels well without any cardiopulmonary symptoms. I have discussed risks / benefits and she wishes to proceed.   Past Medical History:  Diagnosis Date   ALLERGIC RHINITIS 03/02/2007   ALOPECIA TOTALIS 03/02/2007   ANA POSITIVE 09/03/2009   ANOMALY, CONGENITAL, KIDNEY NEC 03/02/2007   COLONIC POLYPS, HX OF 03/02/2007   DEGENERATION, LUMBAR/LUMBOSACRAL DISC 03/02/2007   DERMATITIS, ATOPIC 03/02/2007   DYSPNEA 03/01/2007   GERD 03/02/2007   Heart murmur    Hidradenitis 03/05/2010   Hyperlipidemia    HYPERTENSION, BENIGN 11/28/2005   LACTOSE INTOLERANCE 03/02/2007   NEPHROLITHIASIS, HX OF 03/02/2007   OTITIS EXTERNA, CHRONIC NEC 03/02/2007   OVERACTIVE BLADDER 09/03/2009   Overweight(278.02) 03/02/2007   PALPITATIONS, RECURRENT 03/02/2007   PHOTOALLERGIC DERMATITIS 03/02/2007   PSORIASIS 08/28/2008   THALASSEMIA NEC 03/02/2007   Unspecified anemia 10/09/2008    Past Surgical History:  Procedure Laterality Date   CESAREAN SECTION     exploratory abdominal surgury  1970's   s/p BTL     s/p C-section twice      Prior to Admission medications   Medication Sig Start Date End Date Taking? Authorizing Provider  amLODipine (NORVASC) 2.5 MG tablet Take 1 tablet (2.5 mg total) by mouth daily. 10/11/21  Yes Sharlene Dory, DO  atorvastatin (LIPITOR) 10 MG tablet TAKE 1 TABLET BY MOUTH DAILY 08/06/21  Yes Sharlene Dory, DO  aspirin EC 81 MG tablet Take 81 mg by mouth daily. Swallow whole. Patient not taking: Reported on 11/24/2021    [provider]  COSENTYX SENSOREADY, 300 MG, 150 MG/ML  SOAJ Inject into the skin. 10/15/21   [provider]  triamcinolone cream (KENALOG) 0.1 % Apply 1 application topically 2 (two) times daily. Patient not taking: Reported on 11/03/2021 04/13/21   Sharlene Dory, DO    Current Outpatient Medications  Medication Sig Dispense Refill   amLODipine (NORVASC) 2.5 MG tablet Take 1 tablet (2.5 mg total) by mouth daily. 90 tablet 2   atorvastatin (LIPITOR) 10 MG tablet TAKE 1 TABLET BY MOUTH DAILY 90 tablet 1   aspirin EC 81 MG tablet Take 81 mg by mouth daily. Swallow whole. (Patient not taking: Reported on 11/24/2021)     COSENTYX SENSOREADY, 300 MG, 150 MG/ML SOAJ Inject into the skin.     triamcinolone cream (KENALOG) 0.1 % Apply 1 application topically 2 (two) times daily. (Patient not taking: Reported on 11/03/2021) 453.6 g 0   Current Facility-Administered Medications  Medication Dose Route Frequency Provider Last Rate Last Admin   0.9 %  sodium chloride infusion  500 mL Intravenous Once [place], [name], MD        Allergies as of 11/24/2021   (No Known Allergies)    Family History  Problem Relation Age of Onset   Breast cancer Mother 60       diagnosed   Heart attack Father 50   Heart disease Sister 31       had heart valve replaced   Colon cancer Neg Hx    Colon polyps Neg Hx    Esophageal cancer Neg  Hx    Stomach cancer Neg Hx    Rectal cancer Neg Hx     Social History   Socioeconomic History   Marital status: Divorced    Spouse name: Not on file   Number of children: 2   Years of education: Not on file   Highest education level: Not on file  Occupational History   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  Tobacco Use   Smoking status: Former    Packs/day: 0.25    Years: 15.00    Total pack years: 3.75    Types: Cigarettes    Quit date: 07/27/1986    Years since quitting: 35.3   Smokeless tobacco: Never   Tobacco comments:    Stopped in 1988 and started back in 2015  Vaping Use   Vaping  Use: Never used  Substance and Sexual Activity   Alcohol use: Yes    Alcohol/week: 1.0 standard drink of alcohol    Types: 1 Glasses of wine per week    Comment: glass of wine weekend   Drug use: No   Sexual activity: Not Currently    Comment: 1st intercourse- 18, partners- less than 5  Other Topics Concern   Not on file  Social History Narrative   Not on file   Social Determinants of Health   Financial Resource Strain: Low Risk  (06/11/2021)   Overall Financial Resource Strain (CARDIA)    Difficulty of Paying Living Expenses: Not hard at all  Food Insecurity: No Food Insecurity (06/11/2021)   Hunger Vital Sign    Worried About Running Out of Food in the Last Year: Never true    Ran Out of Food in the Last Year: Never true  Transportation Needs: No Transportation Needs (06/11/2021)   PRAPARE - Administrator, Civil Service (Medical): No    Lack of Transportation (Non-Medical): No  Physical Activity: Inactive (06/11/2021)   Exercise Vital Sign    Days of Exercise per Week: 0 days    Minutes of Exercise per Session: 0 min  Stress: No Stress Concern Present (06/11/2021)   [employer] of Occupational Health - Occupational Stress Questionnaire    Feeling of Stress : Not at all  Social Connections: Moderately Isolated (06/11/2021)   Social Connection and Isolation Panel [NHANES]    Frequency of Communication with Friends and Family: More than three times a week    Frequency of Social Gatherings with Friends and Family: More than three times a week    Attends Religious Services: More than 4 times per year    Active Member of [employer] or Organizations: No    Attends Banker Meetings: Never    Marital Status: Divorced  Catering manager Violence: Not At Risk (06/11/2021)   Humiliation, Afraid, Rape, and Kick questionnaire    Fear of Current or Ex-Partner: No    Emotionally Abused: No    Physically Abused: No    Sexually Abused: No    Review of Systems: All  other review of systems negative except as mentioned in the HPI.  Physical Exam: Vital signs BP (!) 170/78   Pulse 63   Temp 98.7 F (37.1 C)   Resp 18   Ht 5\' 1"  (1.549 m)   Wt 147 lb (66.7 kg)   SpO2 100%   BMI 27.78 kg/m   General:   Alert,  Well-developed, pleasant and cooperative in NAD Lungs:  Clear throughout to auscultation.   Heart:  Regular rate and rhythm  Abdomen:  Soft, nontender and nondistended.   Neuro/Psych:  Alert and cooperative. Normal mood and affect. A and O x 3  Harlin Rain, MD [facility]

## 2021-11-25 ENCOUNTER — Telehealth: Payer: Self-pay

## 2021-11-25 NOTE — Telephone Encounter (Signed)
  Follow up Call-     11/24/2021    7:59 AM  Call back number  Post procedure Call Back phone  # [phone]  Permission to leave phone message Yes     Patient questions:  Do you have a fever, pain , or abdominal swelling? No. Pain Score  0 *  Have you tolerated food without any problems? Yes.    Have you been able to return to your normal activities? Yes.    Do you have any questions about your discharge instructions: Diet   No. Medications  No. Follow up visit  No.  Do you have questions or concerns about your Care? No.  Actions: * If pain score is 4 or above: No action needed, pain <4.

## 2021-12-10 ENCOUNTER — Encounter: Payer: Self-pay | Admitting: Family Medicine

## 2021-12-10 ENCOUNTER — Telehealth: Payer: Self-pay | Admitting: Family Medicine

## 2021-12-10 ENCOUNTER — Ambulatory Visit (INDEPENDENT_AMBULATORY_CARE_PROVIDER_SITE_OTHER): Payer: Medicare Other | Admitting: Family Medicine

## 2021-12-10 ENCOUNTER — Other Ambulatory Visit (HOSPITAL_BASED_OUTPATIENT_CLINIC_OR_DEPARTMENT_OTHER): Payer: Self-pay

## 2021-12-10 VITALS — BP 125/70 | HR 60 | Temp 98.3°F | Ht 60.0 in | Wt 148.4 lb

## 2021-12-10 DIAGNOSIS — J0140 Acute pansinusitis, unspecified: Secondary | ICD-10-CM

## 2021-12-10 DIAGNOSIS — H539 Unspecified visual disturbance: Secondary | ICD-10-CM

## 2021-12-10 MED ORDER — ZOSTER VAC RECOMB ADJUVANTED 50 MCG/0.5ML IM SUSR
INTRAMUSCULAR | 0 refills | Status: DC
  Filled 2021-12-10: qty 1, 1d supply, fill #0

## 2021-12-10 MED ORDER — PREDNISONE 20 MG PO TABS: 40.0000 mg | ORAL_TABLET | Freq: Every day | ORAL | 0 refills | Status: AC

## 2021-12-10 MED ORDER — DOXYCYCLINE HYCLATE 100 MG PO TABS: 100.0000 mg | ORAL_TABLET | Freq: Two times a day (BID) | ORAL | 0 refills | Status: AC

## 2021-12-10 NOTE — Telephone Encounter (Signed)
Pt called to make an appt to see pcp. She stated she is SOB, lightheaded and vision seems to be going in and out with some sinus issues. Transferred to triage.

## 2021-12-10 NOTE — Patient Instructions (Addendum)
Continue to push fluids, practice good hand hygiene, and cover your mouth if you cough.  If you start having fevers, shaking or shortness of breath, seek immediate care.  OK to take Tylenol 1000 mg (2 extra strength tabs) or 975 mg (3 regular strength tabs) every 6 hours as needed.  Take the doxycycline tomorrow if you continue to worsen.   If you do not hear anything about your referral in the next 1-2 weeks, call our office and ask for an update.  Let us know if you need anything.

## 2021-12-10 NOTE — Progress Notes (Signed)
Chief Complaint  Patient presents with   Nasal Congestion   Sinusitis    Victoria Hale here for URI complaints.  Duration: 4 days  Associated symptoms: sinus congestion, sinus pain, rhinorrhea, shortness of breath, and coughing Denies: itchy watery eyes, ear pain, ear drainage, sore throat, wheezing, myalgia, and fevers Treatment to date: none Sick contacts: No  Vision For the past 2 weeks, the patient was noted "floaters" in both of her eyes.  She does not have any vision changes otherwise but notes it is like lint coming down.  She has not noticed any flashing lights or rainbow colored curtains in her eyes.  There is no eye pain or drainage.  Past Medical History:  Diagnosis Date   ALLERGIC RHINITIS 03/02/2007   ALOPECIA TOTALIS 03/02/2007   ANA POSITIVE 09/03/2009   ANOMALY, CONGENITAL, KIDNEY NEC 03/02/2007   COLONIC POLYPS, HX OF 03/02/2007   DEGENERATION, LUMBAR/LUMBOSACRAL DISC 03/02/2007   DERMATITIS, ATOPIC 03/02/2007   DYSPNEA 03/01/2007   GERD 03/02/2007   Heart murmur    Hidradenitis 03/05/2010   Hyperlipidemia    HYPERTENSION, BENIGN 11/28/2005   LACTOSE INTOLERANCE 03/02/2007   NEPHROLITHIASIS, HX OF 03/02/2007   OTITIS EXTERNA, CHRONIC NEC 03/02/2007   OVERACTIVE BLADDER 09/03/2009   Overweight(278.02) 03/02/2007   PALPITATIONS, RECURRENT 03/02/2007   PHOTOALLERGIC DERMATITIS 03/02/2007   PSORIASIS 08/28/2008   THALASSEMIA NEC 03/02/2007   Unspecified anemia 10/09/2008    Objective BP 125/70   Pulse 60   Temp 98.3 F (36.8 C) (Oral)   Ht 5' (1.524 m)   Wt 148 lb 6 oz (67.3 kg)   SpO2 99%   BMI 28.98 kg/m  General: Awake, alert, appears stated age HEENT: AT, [place], ears patent b/l and TM's neg, nares patent w/o discharge, pharynx pink and without exudates, MMM Neck: No masses or asymmetry Heart: RRR Lungs: CTAB, no accessory muscle use Psych: Age appropriate judgment and insight, normal mood and affect  Acute pansinusitis, recurrence not  specified - Plan: doxycycline (VIBRA-TABS) 100 MG tablet, predniSONE (DELTASONE) 20 MG tablet  Vision changes - Plan: Ambulatory referral to Ophthalmology  Prednisone 5 days, 40 mg daily.  If no improvement, can take doxycycline.  Conversely, if she continues to worsen in the next 24 hours, she will start doxycycline.  Continue to push fluids, practice good hand hygiene, cover mouth when coughing. F/u prn. If starting to experience fevers, shaking, or shortness of breath, seek immediate care. Refer to ophthalmology.  Does not seem like a retinal detachment or anything sinister. She will follow-up as originally scheduled. Pt voiced understanding and agreement to the plan.  Jilda Roche [place], DO 12/10/21 2:53 PM

## 2021-12-10 NOTE — Telephone Encounter (Signed)
Nurse Assessment Nurse: Doree Barthel, RN, Danica Date/Time (Eastern Time): 12/10/2021 11:01:42 AM Confirm and document reason for call. If symptomatic, describe symptoms. ---Caller states she has a dry cough and has some SOB. Seeing black spots in eyes. Feeling lightheaded. No fever. Having sinus pain as well. Does the patient have any new or worsening symptoms? ---Yes Will a triage be completed? ---Yes Related visit to physician within the last 2 weeks? ---No Does the PT have any chronic conditions? (i.e. diabetes, asthma, this includes High risk factors for pregnancy, etc.) ---Yes List chronic conditions. ---htn, high cholesterol Is this a behavioral health or substance abuse call? ---No Guidelines Guideline Title Affirmed Question Affirmed Notes Nurse Date/Time Lamount Cohen Time) Sinus Pain or Congestion [1] Difficulty breathing AND [2] not from stuffy nose (e.g., not relieved by cleaning out the nose) Bringas, RN, Danica 12/10/2021 11:03:39 AM PLEASE NOTE: All timestamps contained within this report are represented as [place] Standard Time. CONFIDENTIALTY NOTICE: This fax transmission is intended only for the addressee. It contains information that is legally privileged, confidential or otherwise protected from use or disclosure. If you are not the intended recipient, you are strictly prohibited from reviewing, disclosing, copying using or disseminating any of this information or taking any action in reliance on or regarding this information. If you have received this fax in error, please notify us immediately by telephone so that we can arrange for its return to [place]. Phone: [phone], Toll-Free: [phone], Fax: [phone] Page: 2 of 2 Call Id: [id number] Disp. Time Lamount Cohen Time) Disposition Final User 12/10/2021 11:09:20 AM Go to ED Now Yes Doree Barthel, RN, Nicholes Mango Final Disposition 12/10/2021 11:09:20 AM Go to ED Now Yes Bringas, RN, Nicholes Mango Caller Disagree/Comply Disagree Caller  Understands Yes PreDisposition InappropriateToAsk Care Advice Given Per Guideline GO TO ED NOW: * You need to be seen in the Emergency Department. * Leave now. Drive carefully. CARE ADVICE given per Sinus Pain or Congestion (Adult) guideline. Comments User: Lorrene Reid, RN Date/Time Lamount Cohen Time): 12/10/2021 11:10:02 AM caller refuses ED, states she wants an appt at the office today. transferred to office backline and appt made for 1:45pm today Referrals [facility] - ED Warm transfer to backline

## 2022-01-28 DIAGNOSIS — H2513 Age-related nuclear cataract, bilateral: Secondary | ICD-10-CM | POA: Diagnosis not present

## 2022-02-01 ENCOUNTER — Other Ambulatory Visit: Payer: Self-pay | Admitting: Family Medicine

## 2022-02-01 MED ORDER — ATORVASTATIN CALCIUM 10 MG PO TABS: ORAL_TABLET | ORAL | 1 refills | Status: DC

## 2022-03-10 DIAGNOSIS — Z79899 Other long term (current) drug therapy: Secondary | ICD-10-CM | POA: Diagnosis not present

## 2022-03-10 DIAGNOSIS — L400 Psoriasis vulgaris: Secondary | ICD-10-CM | POA: Diagnosis not present

## 2022-03-28 ENCOUNTER — Other Ambulatory Visit (HOSPITAL_BASED_OUTPATIENT_CLINIC_OR_DEPARTMENT_OTHER): Payer: Self-pay | Admitting: Family Medicine

## 2022-03-28 DIAGNOSIS — Z1231 Encounter for screening mammogram for malignant neoplasm of breast: Secondary | ICD-10-CM

## 2022-03-29 ENCOUNTER — Inpatient Hospital Stay (HOSPITAL_BASED_OUTPATIENT_CLINIC_OR_DEPARTMENT_OTHER): Admission: RE | Admit: 2022-03-29 | Payer: Medicare Other | Source: Ambulatory Visit

## 2022-03-29 ENCOUNTER — Ambulatory Visit (HOSPITAL_BASED_OUTPATIENT_CLINIC_OR_DEPARTMENT_OTHER)
Admission: RE | Admit: 2022-03-29 | Discharge: 2022-03-29 | Disposition: A | Discharge status: Home / Self Care | Payer: Medicare Other | Source: Ambulatory Visit | Attending: Family | Admitting: Family

## 2022-03-29 ENCOUNTER — Encounter (HOSPITAL_BASED_OUTPATIENT_CLINIC_OR_DEPARTMENT_OTHER): Payer: Self-pay

## 2022-03-29 ENCOUNTER — Ambulatory Visit (HOSPITAL_BASED_OUTPATIENT_CLINIC_OR_DEPARTMENT_OTHER)
Admission: RE | Admit: 2022-03-29 | Discharge: 2022-03-29 | Disposition: A | Discharge status: Home / Self Care | Payer: Medicare Other | Source: Ambulatory Visit | Attending: Family Medicine | Admitting: Family Medicine

## 2022-03-29 ENCOUNTER — Encounter: Payer: Self-pay | Admitting: Family

## 2022-03-29 ENCOUNTER — Ambulatory Visit (INDEPENDENT_AMBULATORY_CARE_PROVIDER_SITE_OTHER): Payer: Medicare Other | Admitting: Family

## 2022-03-29 VITALS — BP 152/62 | HR 66 | Temp 97.8°F | Ht 60.0 in | Wt 152.2 lb

## 2022-03-29 DIAGNOSIS — R202 Paresthesia of skin: Secondary | ICD-10-CM

## 2022-03-29 DIAGNOSIS — M542 Cervicalgia: Secondary | ICD-10-CM

## 2022-03-29 DIAGNOSIS — R200 Anesthesia of skin: Secondary | ICD-10-CM | POA: Diagnosis not present

## 2022-03-29 DIAGNOSIS — Z23 Encounter for immunization: Secondary | ICD-10-CM | POA: Diagnosis not present

## 2022-03-29 DIAGNOSIS — J3489 Other specified disorders of nose and nasal sinuses: Secondary | ICD-10-CM | POA: Diagnosis not present

## 2022-03-29 DIAGNOSIS — Z1231 Encounter for screening mammogram for malignant neoplasm of breast: Secondary | ICD-10-CM | POA: Insufficient documentation

## 2022-03-29 NOTE — Progress Notes (Signed)
Victoria Hale is a 71 y.o. female with the following history as recorded in EpicCare:  Patient Active Problem List   Diagnosis Date Noted   Prediabetes 08/10/2021   GAD (generalized anxiety disorder) 09/04/2020   Urge incontinence 05/06/2020   Vitamin D deficiency 06/26/2019   Smoker 12/24/2018   Cough 12/24/2018   Dyspnea 12/24/2018   Ear pain, left 10/26/2016   Neck pain 05/10/2016   Hyperglycemia 04/08/2016   Thyroid disease 04/08/2016   Rash 04/08/2016   Urinary frequency 04/08/2016   Degenerative cervical disc 10/16/2015   Pain and swelling of left shoulder 10/02/2015   Psoriasiform seborrheic dermatitis 06/26/2014   Tonsillitis 12/28/2012   Mixed hyperlipidemia 12/28/2011   Chronic LBP 11/15/2011   Neck pain on left side 11/15/2011   Odynophagia 02/18/2011   Neck pain, acute 01/24/2011   Low back pain radiating to right leg 01/24/2011   Anemia, iron deficiency 09/13/2010   Encounter for well adult exam with abnormal findings 09/10/2010   Hidradenitis 03/05/2010   OVERACTIVE BLADDER 09/03/2009   ANA positive 09/03/2009   PSORIASIS 08/28/2008   DERMATITIS, SEBORRHEIC 12/04/2007   LACTOSE INTOLERANCE 03/02/2007   OTITIS EXTERNA, CHRONIC NEC 03/02/2007   Allergic rhinitis 03/02/2007   GERD 03/02/2007   Atopic dermatitis 03/02/2007   PHOTOALLERGIC DERMATITIS 03/02/2007   ALOPECIA TOTALIS 03/02/2007   DEGENERATION, LUMBAR/LUMBOSACRAL DISC 03/02/2007   ANOMALY, CONGENITAL, KIDNEY NEC 03/02/2007   PALPITATIONS, RECURRENT 03/02/2007   COLONIC POLYPS, HX OF 03/02/2007   NEPHROLITHIASIS, HX OF 03/02/2007   Essential hypertension 11/28/2005    Current Outpatient Medications  Medication Sig Dispense Refill   amLODipine (NORVASC) 2.5 MG tablet Take 1 tablet (2.5 mg total) by mouth daily. 90 tablet 2   atorvastatin (LIPITOR) 10 MG tablet TAKE 1 TABLET BY MOUTH DAILY 90 tablet 1   COSENTYX SENSOREADY, 300 MG, 150 MG/ML SOAJ Inject into the skin.     aspirin EC 81 MG  tablet Take 81 mg by mouth daily. Swallow whole. (Patient not taking: Reported on 03/29/2022)     No current facility-administered medications for this visit.    Allergies: Patient has no known allergies.  Past Medical History:  Diagnosis Date   ALLERGIC RHINITIS 03/02/2007   ALOPECIA TOTALIS 03/02/2007   ANA POSITIVE 09/03/2009   ANOMALY, CONGENITAL, KIDNEY NEC 03/02/2007   COLONIC POLYPS, HX OF 03/02/2007   DEGENERATION, LUMBAR/LUMBOSACRAL DISC 03/02/2007   DERMATITIS, ATOPIC 03/02/2007   DYSPNEA 03/01/2007   GERD 03/02/2007   Heart murmur    Hidradenitis 03/05/2010   Hyperlipidemia    HYPERTENSION, BENIGN 11/28/2005   LACTOSE INTOLERANCE 03/02/2007   NEPHROLITHIASIS, HX OF 03/02/2007   OTITIS EXTERNA, CHRONIC NEC 03/02/2007   OVERACTIVE BLADDER 09/03/2009   Overweight(278.02) 03/02/2007   PALPITATIONS, RECURRENT 03/02/2007   PHOTOALLERGIC DERMATITIS 03/02/2007   PSORIASIS 08/28/2008   THALASSEMIA NEC 03/02/2007   Unspecified anemia 10/09/2008    Past Surgical History:  Procedure Laterality Date   CESAREAN SECTION     exploratory abdominal surgury  1970's   s/p BTL     s/p C-section twice      Family History  Problem Relation Age of Onset   Breast cancer Mother 46       diagnosed   Heart attack Father 25   Heart disease Sister 80       had heart valve replaced   Colon cancer Neg Hx    Colon polyps Neg Hx    Esophageal cancer Neg Hx    Stomach cancer Neg Hx  Rectal cancer Neg Hx     Social History   Tobacco Use   Smoking status: Former    Packs/day: 0.25    Years: 15.00    Total pack years: 3.75    Types: Cigarettes    Quit date: 07/27/1986    Years since quitting: 35.6   Smokeless tobacco: Never   Tobacco comments:    Stopped in 1988 and started back in 2015  Substance Use Topics   Alcohol use: Yes    Alcohol/week: 1.0 standard drink of alcohol    Types: 1 Glasses of wine per week    Comment: glass of wine weekend    Subjective:  Patient  walked in the office today without appointment- asked to be seen with concerns for left arm feeling "numb and cold." She also notes that her face has felt swollen to her; denies any vision changes or speech changes; does have known history of neck arthritis changes; tried applying heat to affected area with limited benefit; Denies any chest pain, shortness of breath, blurred vision or headache.      Objective:  Vitals:   03/29/22 1400  BP: (!) 152/62  Pulse: 66  Temp: 97.8 F (36.6 C)  TempSrc: Oral  SpO2: 98%  Weight: 152 lb 3.2 oz (69 kg)  Height: 5' (1.524 m)    General: Well developed, well nourished, in no acute distress  Skin : Warm and dry.  Head: Normocephalic and atraumatic  Eyes: Sclera and conjunctiva clear; pupils round and reactive to light; extraocular movements intact  Ears: External normal; canals clear; tympanic membranes normal  Oropharynx: Pink, supple. No suspicious lesions  Neck: Supple without thyromegaly, adenopathy  Lungs: Respirations unlabored; clear to auscultation bilaterally without wheeze, rales, rhonchi  CVS exam: normal rate and regular rhythm.  Musculoskeletal: No deformities; no active joint inflammation  Extremities: No edema, cyanosis, clubbing  Vessels: Symmetric bilaterally  Neurologic: Alert and oriented; speech intact; face symmetrical; moves all extremities well; CNII-XII intact without focal deficit   Assessment:  1. Neck pain   2. Numbness and tingling in left arm   3. Facial tingling sensation   4. Need for influenza vaccination     Plan:  Physical exam is reassuring; no facial swelling noted; suspect symptoms are related to her neck but patient is understandably concerned for TIA and/or stroke; STAT head CT is ordered today; will also updated cervical X-ray; follow up to be determined;  Flu shot updated;   No follow-ups on file.  Orders Placed This Encounter  Procedures   DG Cervical Spine Complete    Standing Status:   Future     Number of Occurrences:   1    Standing Expiration Date:   03/30/2023    Order Specific Question:   Reason for Exam (SYMPTOM  OR DIAGNOSIS REQUIRED)    Answer:   neck pain    Order Specific Question:   Preferred imaging location?    Answer:   [facility]   CT HEAD WO CONTRAST (5MM)    Standing Status:   Future    Number of Occurrences:   1    Standing Expiration Date:   03/30/2023    Order Specific Question:   Preferred imaging location?    Answer:   [facility]   Flu Vaccine QUAD High Dose(Fluad)    Requested Prescriptions    No prescriptions requested or ordered in this encounter

## 2022-04-01 ENCOUNTER — Other Ambulatory Visit: Payer: Self-pay | Admitting: Family

## 2022-04-01 DIAGNOSIS — R9389 Abnormal findings on diagnostic imaging of other specified body structures: Secondary | ICD-10-CM

## 2022-04-04 ENCOUNTER — Encounter: Payer: Self-pay | Admitting: Family

## 2022-04-05 ENCOUNTER — Ambulatory Visit
Admission: RE | Admit: 2022-04-05 | Discharge: 2022-04-05 | Disposition: A | Discharge status: Home / Self Care | Payer: Medicare Other | Source: Ambulatory Visit | Attending: Family | Admitting: Family

## 2022-04-05 DIAGNOSIS — R221 Localized swelling, mass and lump, neck: Secondary | ICD-10-CM | POA: Diagnosis not present

## 2022-04-05 DIAGNOSIS — R9389 Abnormal findings on diagnostic imaging of other specified body structures: Secondary | ICD-10-CM

## 2022-04-05 DIAGNOSIS — M542 Cervicalgia: Secondary | ICD-10-CM | POA: Diagnosis not present

## 2022-04-05 MED ORDER — IOPAMIDOL (ISOVUE-300) INJECTION 61%
75.0000 mL | Freq: Once | INTRAVENOUS | Status: AC | PRN
  Administered 2022-04-05: 75 mL via INTRAVENOUS

## 2022-04-08 ENCOUNTER — Other Ambulatory Visit (INDEPENDENT_AMBULATORY_CARE_PROVIDER_SITE_OTHER): Payer: Medicare Other

## 2022-04-08 ENCOUNTER — Other Ambulatory Visit: Payer: Self-pay | Admitting: Family

## 2022-04-08 DIAGNOSIS — I10 Essential (primary) hypertension: Secondary | ICD-10-CM | POA: Diagnosis not present

## 2022-04-08 DIAGNOSIS — R7303 Prediabetes: Secondary | ICD-10-CM | POA: Diagnosis not present

## 2022-04-08 DIAGNOSIS — R9389 Abnormal findings on diagnostic imaging of other specified body structures: Secondary | ICD-10-CM

## 2022-04-08 DIAGNOSIS — E782 Mixed hyperlipidemia: Secondary | ICD-10-CM | POA: Diagnosis not present

## 2022-04-08 LAB — COMPREHENSIVE METABOLIC PANEL
ALT: 8 U/L (ref 0–35)
AST: 14 U/L (ref 0–37)
Albumin: 3.8 g/dL (ref 3.5–5.2)
Alkaline Phosphatase: 80 U/L (ref 39–117)
BUN: 12 mg/dL (ref 6–23)
CO2: 31 mEq/L (ref 19–32)
Calcium: 8.9 mg/dL (ref 8.4–10.5)
Chloride: 108 mEq/L (ref 96–112)
Creatinine, Ser: 0.74 mg/dL (ref 0.40–1.20)
GFR: 81.18 mL/min (ref >60.00)
Glucose, Bld: 91 mg/dL (ref 70–99)
Potassium: 4.3 mEq/L (ref 3.5–5.1)
Sodium: 142 mEq/L (ref 135–145)
Total Bilirubin: 0.8 mg/dL (ref 0.2–1.2)
Total Protein: 7 g/dL (ref 6.0–8.3)

## 2022-04-08 LAB — LIPID PANEL
Cholesterol: 165 mg/dL (ref 0–200)
HDL: 59.2 mg/dL (ref >39.00)
LDL Cholesterol: 91 mg/dL (ref 0–99)
NonHDL: 105.69
Total CHOL/HDL Ratio: 3
Triglycerides: 75 mg/dL (ref 0.0–149.0)
VLDL: 15 mg/dL (ref 0.0–40.0)

## 2022-04-08 LAB — CBC
HCT: 39.1 % (ref 36.0–46.0)
Hemoglobin: 12.1 g/dL (ref 12.0–15.0)
MCHC: 30.9 g/dL (ref 30.0–36.0)
MCV: 69.1 fl — ABNORMAL LOW (ref 78.0–100.0)
Platelets: 157 10*3/uL (ref 150.0–400.0)
RBC: 5.65 Mil/uL — ABNORMAL HIGH (ref 3.87–5.11)
RDW: 15.9 % — ABNORMAL HIGH (ref 11.5–15.5)
WBC: 4 10*3/uL (ref 4.0–10.5)

## 2022-04-08 LAB — HEMOGLOBIN A1C: Hgb A1c MFr Bld: 6.3 % (ref 4.6–6.5)

## 2022-04-11 ENCOUNTER — Ambulatory Visit (INDEPENDENT_AMBULATORY_CARE_PROVIDER_SITE_OTHER): Payer: Medicare Other

## 2022-04-11 ENCOUNTER — Encounter: Payer: Self-pay | Admitting: Orthopedic Surgery

## 2022-04-11 ENCOUNTER — Ambulatory Visit (INDEPENDENT_AMBULATORY_CARE_PROVIDER_SITE_OTHER): Payer: Medicare Other | Admitting: Orthopedic Surgery

## 2022-04-11 VITALS — BP 161/62 | HR 54 | Ht 60.0 in | Wt 152.5 lb

## 2022-04-11 DIAGNOSIS — M542 Cervicalgia: Secondary | ICD-10-CM

## 2022-04-11 DIAGNOSIS — M19071 Primary osteoarthritis, right ankle and foot: Secondary | ICD-10-CM | POA: Diagnosis not present

## 2022-04-11 DIAGNOSIS — L600 Ingrowing nail: Secondary | ICD-10-CM | POA: Diagnosis not present

## 2022-04-11 NOTE — Progress Notes (Signed)
Orthopedic Spine Surgery Office Note  Assessment: Patient is a 71 y.o. female with neck pain that radiates into her left shoulder and left retroauricular area.  Has no pain through shoulder range of motion and rotator cuff testing so think this could be a radiculopathy   Plan: -Explained that initially conservative treatment is tried as a significant number of patients may experience relief with these treatment modalities. Discussed that the conservative treatments include:  -activity modification  -physical therapy  -over the counter pain medications  -medrol dosepak  -cervical steroid injections -Patient has tried application of heat -Recommended physical therapy.  Patient wanted to think more about physical therapy at this time.  I told her to call the office if she wants to get started and I will put a referral in. -If she is not getting better with physical therapy, would recommend MRI of the cervical spine -Patient should return to office in 8 weeks, repeat x-rays of cervical spine at next visit: None   Patient expressed understanding of the plan and all questions were answered to the patient's satisfaction.   ___________________________________________________________________________   History:  Patient is a 71 y.o. female who presents today for cervical spine.  Patient has had neck pain that radiates into her left shoulder and left retroauricular area.  Pain started about a year ago and is gotten progressively worse with time.  She notes more recent onset of the pain behind her ear.  There is no trauma or injury that brought on the pain.  She has tried local heat application that does seem to help.  She says that premature moves all of her pain.  She has not tried any other treatments.  Weakness: Denies Difficulty with fine motor skills (e.g., buttoning shirts, handwriting): Denies Symptoms of imbalance: Denies Paresthesias and numbness: Denies Bowel or bladder incontinence:  Denies Saddle anesthesia: Denies  Treatments tried: Heat application  Review of systems: Denies fevers and chills, night sweats, unexplained weight loss, history of cancer, pain that wakes them at night  Past medical history: GERD HLD Psoriasis HTN Nephrolithiasis Allergic rhinitis  Allergies: NKDA  Past surgical history:  None  Social history: Denies use of nicotine product (smoking, vaping, patches, smokeless) Alcohol use: Denies Denies recreational drug use  Physical Exam:  General: no acute distress, appears stated age Neurologic: alert, answering questions appropriately, following commands Respiratory: unlabored breathing on room air, symmetric chest rise Psychiatric: appropriate affect, normal cadence to speech   MSK (spine):  -Strength exam      Left  Right Grip strength                5/5  5/5 Interosseus   5/5   5/5 Wrist extension  5/5  5/5 Wrist flexion   5/5  5/5 Elbow flexion   5/5  5/5 Deltoid    5/5  5/5  EHL    5/5  5/5 TA    5/5  5/5 GSC    5/5  5/5 Knee extension  5/5  5/5 Hip flexion   5/5  5/5  -Sensory exam    Sensation intact to light touch in L3-S1 nerve distributions of bilateral lower extremities  Sensation intact to light touch in C5-T1 nerve distributions of bilateral upper extremities  -Brachioradialis DTR: 1/4 on the left, 1/4 on the right -Biceps DTR: 1/4 on the left, 1/4 on the right -Achilles DTR: 1/4 on the left, 1/4 on the right -Patellar tendon DTR: 1/4 on the left, 1/4 on the right  -Spurling:  Negative bilaterally -Hoffman sign: Negative bilaterally -Clonus: No beats bilaterally -Interosseous wasting: None seen -Grip and release test: Negative -Romberg: Negative -Gait: Normal -Imbalance with tandem gait: No  Left shoulder exam: No pain to range of motion, negative Jobe test, no weakness with external rotation with arm at side, negative belly press, negative liftoff, negative Hawkins Right shoulder exam: No  pain through range of motion  Tinel's at wrist: negative bilaterally Durkan's: negative bilaterally  Tinel's at elbow: negative bilaterally  Imaging: XR of the cervical spine from 03/29/2022 and 04/11/2022 was independently reviewed and interpreted, showing no fracture or dislocation. Ankylosis of the cervical spine at C3-6. Disc height loss at C4/5. No evidence of instability on flexion/extension.   CT of the cervical spine from 04/05/2022 was independently reviewed and interpreted, showing bridging syndesmophytes. Autofusion at C4/5. No fracture or dislocation.    Patient name: Victoria Hale Patient MRN: [id number] Date of visit: 04/11/22

## 2022-04-15 ENCOUNTER — Encounter: Payer: Self-pay | Admitting: Family Medicine

## 2022-04-15 ENCOUNTER — Ambulatory Visit (INDEPENDENT_AMBULATORY_CARE_PROVIDER_SITE_OTHER): Payer: Medicare Other | Admitting: Family Medicine

## 2022-04-15 VITALS — BP 138/80 | HR 54 | Temp 97.7°F | Ht 61.0 in | Wt 150.2 lb

## 2022-04-15 DIAGNOSIS — Z0000 Encounter for general adult medical examination without abnormal findings: Secondary | ICD-10-CM | POA: Diagnosis not present

## 2022-04-15 DIAGNOSIS — E782 Mixed hyperlipidemia: Secondary | ICD-10-CM

## 2022-04-15 DIAGNOSIS — R7303 Prediabetes: Secondary | ICD-10-CM

## 2022-04-15 MED ORDER — METFORMIN HCL 500 MG PO TABS: 500.0000 mg | ORAL_TABLET | Freq: Every day | ORAL | 2 refills | Status: DC

## 2022-04-15 NOTE — Progress Notes (Signed)
Chief Complaint  Patient presents with   Annual Exam     Well Woman Victoria Hale is here for a complete physical.   Her last physical was >1 year ago.  Current diet: in general, diet is fair. Current exercise: active in yard. Weight is stable and she denies daytime fatigue. Seatbelt? Yes Advanced directive? No  Health Maintenance Colonoscopy- Yes Shingrix- Due for 2/2 DEXA- Yes Mammogram- Yes Tetanus- Yes Pneumonia- Yes Hep C screen- Yes  Past Medical History:  Diagnosis Date   ALLERGIC RHINITIS 03/02/2007   ALOPECIA TOTALIS 03/02/2007   ANA POSITIVE 09/03/2009   ANOMALY, CONGENITAL, KIDNEY NEC 03/02/2007   COLONIC POLYPS, HX OF 03/02/2007   DEGENERATION, LUMBAR/LUMBOSACRAL DISC 03/02/2007   DERMATITIS, ATOPIC 03/02/2007   DYSPNEA 03/01/2007   GERD 03/02/2007   Heart murmur    Hidradenitis 03/05/2010   Hyperlipidemia    HYPERTENSION, BENIGN 11/28/2005   LACTOSE INTOLERANCE 03/02/2007   NEPHROLITHIASIS, HX OF 03/02/2007   OTITIS EXTERNA, CHRONIC NEC 03/02/2007   OVERACTIVE BLADDER 09/03/2009   Overweight(278.02) 03/02/2007   PALPITATIONS, RECURRENT 03/02/2007   PHOTOALLERGIC DERMATITIS 03/02/2007   PSORIASIS 08/28/2008   THALASSEMIA NEC 03/02/2007   Unspecified anemia 10/09/2008     Past Surgical History:  Procedure Laterality Date   CESAREAN SECTION     exploratory abdominal surgury  1970's   s/p BTL     s/p C-section twice      Medications  Current Outpatient Medications on File Prior to Visit  Medication Sig Dispense Refill   amLODipine (NORVASC) 2.5 MG tablet Take 1 tablet (2.5 mg total) by mouth daily. 90 tablet 2   aspirin EC 81 MG tablet Take 81 mg by mouth daily. Swallow whole.     atorvastatin (LIPITOR) 10 MG tablet TAKE 1 TABLET BY MOUTH DAILY 90 tablet 1   COSENTYX SENSOREADY, 300 MG, 150 MG/ML SOAJ Inject into the skin.      Allergies No Known Allergies  Review of Systems: Constitutional:  no fevers Eye:  no recent significant  change in vision Ears:  No changes in hearing Nose/Mouth/Throat:  no complaints of nasal congestion, no sore throat Cardiovascular: no chest pain Respiratory:  No shortness of breath Gastrointestinal:  No change in bowel habits GU:  Female: negative for dysuria Integumentary:  no abnormal skin lesions reported Neurologic:  no headaches Endocrine:  denies unexplained weight changes  Exam BP 138/80 (BP Location: Left Arm, Patient Position: Sitting, Cuff Size: Normal)   Pulse (!) 54   Temp 97.7 F (36.5 C) (Oral)   Ht 5\' 1"  (1.549 m)   Wt 150 lb 4 oz (68.2 kg)   SpO2 98%   BMI 28.39 kg/m  General:  well developed, well nourished, in no apparent distress Skin:  no significant moles, warts, or growths Head:  no masses, lesions, or tenderness Eyes:  pupils equal and round, sclera anicteric without injection Ears:  canals without lesions, TMs shiny without retraction, no obvious effusion, no erythema Nose:  nares patent, mucosa normal, and no drainage Throat/Pharynx:  lips and gingiva without lesion; tongue and uvula midline; non-inflamed pharynx; no exudates or postnasal drainage Neck: neck supple without adenopathy, thyromegaly, or masses Lungs:  clear to auscultation, breath sounds equal bilaterally, no respiratory distress Cardio:  regular rate and rhythm, no bruits or LE edema Abdomen:  abdomen soft, nontender; bowel sounds normal; no masses or organomegaly Genital: Deferred Neuro:  gait normal; deep tendon reflexes normal and symmetric Psych: well oriented with normal range of affect and  appropriate judgment/insight  Assessment and Plan  Well adult exam  Prediabetes - Plan: metFORMIN (GLUCOPHAGE) 500 MG tablet, Hemoglobin A1c  Mixed hyperlipidemia - Plan: Comprehensive metabolic panel, Lipid panel   Well 71 y.o. female. Counseled on diet and exercise. Advanced directive form provided today.  2nd Shingrix rec'd.  Prediabetes: Chronic, worsening. A1c creeping up, 6.3 most  recently. Start metformin 500 mg/d. Other orders as above. Follow up in 6 mo. Labs 1 week prior.  The patient voiced understanding and agreement to the plan.  [place], DO 04/15/22 10:30 AM

## 2022-04-15 NOTE — Patient Instructions (Addendum)
Keep the diet clean and stay active.  Aim to do some physical exertion for 150 minutes per week. This is typically divided into 5 days per week, 30 minutes per day. The activity should be enough to get your heart rate up. Anything is better than nothing if you have time constraints. I am asking for at least 4 days weekly.   Please schedule your second Shingrix vaccination at your convenience.   Please get me a copy of your advanced directive form at your convenience.   Let us know if you need anything.

## 2022-04-18 ENCOUNTER — Ambulatory Visit: Payer: Medicare Other | Admitting: Orthopedic Surgery

## 2022-06-08 ENCOUNTER — Ambulatory Visit: Payer: Medicare Other | Admitting: Orthopedic Surgery

## 2022-06-30 ENCOUNTER — Telehealth: Payer: Self-pay | Admitting: Family Medicine

## 2022-06-30 ENCOUNTER — Ambulatory Visit (INDEPENDENT_AMBULATORY_CARE_PROVIDER_SITE_OTHER): Payer: Medicare Other | Admitting: *Deleted

## 2022-06-30 VITALS — BP 134/61 | HR 59 | Ht 61.0 in | Wt 144.6 lb

## 2022-06-30 DIAGNOSIS — Z0000 Encounter for general adult medical examination without abnormal findings: Secondary | ICD-10-CM | POA: Diagnosis not present

## 2022-06-30 NOTE — Telephone Encounter (Signed)
Lab appt for 09/2022 , okay to move sooner or leave for May

## 2022-06-30 NOTE — Patient Instructions (Signed)
[name] , Thank you for taking time to come for your Medicare Wellness Visit. I appreciate your ongoing commitment to your health goals. Please review the following plan we discussed and let me know if I can assist you in the future.   These are the goals we discussed:  Goals      Patient Stated     Increase activity, drink more water & eat healthier.        This is a list of the screening recommended for you and due dates:  Health Maintenance  Topic Date Due   Zoster (Shingles) Vaccine (2 of 2) 02/04/2022   COVID-19 Vaccine (3 - Pfizer risk series) 10/14/2022*   Medicare Annual Wellness Visit  07/01/2023   Mammogram  03/29/2024   DTaP/Tdap/Td vaccine (3 - Td or Tdap) 11/02/2027   Colon Cancer Screening  11/25/2031   Pneumonia Vaccine  Completed   Flu Shot  Completed   DEXA scan (bone density measurement)  Completed   Hepatitis C Screening: USPSTF Recommendation to screen - Ages 71-79 yo.  Completed   HPV Vaccine  Aged Out  *Topic was postponed. The date shown is not the original due date.     Next appointment: Follow up in one year for your annual wellness visit.   Preventive Care 54 Years and Older, Female Preventive care refers to lifestyle choices and visits with your health care provider that can promote health and wellness. What does preventive care include? A yearly physical exam. This is also called an annual well check. Dental exams once or twice a year. Routine eye exams. Ask your health care provider how often you should have your eyes checked. Personal lifestyle choices, including: Daily care of your teeth and gums. Regular physical activity. Eating a healthy diet. Avoiding tobacco and drug use. Limiting alcohol use. Practicing safe sex. Taking low-dose aspirin every day. Taking vitamin and mineral supplements as recommended by your health care provider. What happens during an annual well check? The services and screenings done by your health care  provider during your annual well check will depend on your age, overall health, lifestyle risk factors, and family history of disease. Counseling  Your health care provider may ask you questions about your: Alcohol use. Tobacco use. Drug use. Emotional well-being. Home and relationship well-being. Sexual activity. Eating habits. History of falls. Memory and ability to understand (cognition). Work and work Statistician. Reproductive health. Screening  You may have the following tests or measurements: Height, weight, and BMI. Blood pressure. Lipid and cholesterol levels. These may be checked every 5 years, or more frequently if you are over 23 years old. Skin check. Lung cancer screening. You may have this screening every year starting at age 42 if you have a 30-pack-year history of smoking and currently smoke or have quit within the past 15 years. Fecal occult blood test (FOBT) of the stool. You may have this test every year starting at age 96. Flexible sigmoidoscopy or colonoscopy. You may have a sigmoidoscopy every 5 years or a colonoscopy every 10 years starting at age 44. Hepatitis C blood test. Hepatitis B blood test. Sexually transmitted disease (STD) testing. Diabetes screening. This is done by checking your blood sugar (glucose) after you have not eaten for a while (fasting). You may have this done every 1-3 years. Bone density scan. This is done to screen for osteoporosis. You may have this done starting at age 14. Mammogram. This may be done every 1-2 years. Talk to your health  care provider about how often you should have regular mammograms. Talk with your health care provider about your test results, treatment options, and if necessary, the need for more tests. Vaccines  Your health care provider may recommend certain vaccines, such as: Influenza vaccine. This is recommended every year. Tetanus, diphtheria, and acellular pertussis (Tdap, Td) vaccine. You may need a Td  booster every 10 years. Zoster vaccine. You may need this after age 9. Pneumococcal 13-valent conjugate (PCV13) vaccine. One dose is recommended after age 35. Pneumococcal polysaccharide (PPSV23) vaccine. One dose is recommended after age 80. Talk to your health care provider about which screenings and vaccines you need and how often you need them. This information is not intended to replace advice given to you by your health care provider. Make sure you discuss any questions you have with your health care provider. Document Released: 06/12/2015 Document Revised: 02/03/2016 Document Reviewed: 03/17/2015 Elsevier Interactive Patient Education  2017 [place] Prevention in the Home Falls can cause injuries. They can happen to people of all ages. There are many things you can do to make your home safe and to help prevent falls. What can I do on the outside of my home? Regularly fix the edges of walkways and driveways and fix any cracks. Remove anything that might make you trip as you walk through a door, such as a raised step or threshold. Trim any bushes or trees on the path to your home. Use bright outdoor lighting. Clear any walking paths of anything that might make someone trip, such as rocks or tools. Regularly check to see if handrails are loose or broken. Make sure that both sides of any steps have handrails. Any raised decks and porches should have guardrails on the edges. Have any leaves, snow, or ice cleared regularly. Use sand or salt on walking paths during winter. Clean up any spills in your garage right away. This includes oil or grease spills. What can I do in the bathroom? Use night lights. Install grab bars by the toilet and in the tub and shower. Do not use towel bars as grab bars. Use non-skid mats or decals in the tub or shower. If you need to sit down in the shower, use a plastic, non-slip stool. Keep the floor dry. Clean up any water that spills on the floor  as soon as it happens. Remove soap buildup in the tub or shower regularly. Attach bath mats securely with double-sided non-slip rug tape. Do not have throw rugs and other things on the floor that can make you trip. What can I do in the bedroom? Use night lights. Make sure that you have a light by your bed that is easy to reach. Do not use any sheets or blankets that are too big for your bed. They should not hang down onto the floor. Have a firm chair that has side arms. You can use this for support while you get dressed. Do not have throw rugs and other things on the floor that can make you trip. What can I do in the kitchen? Clean up any spills right away. Avoid walking on wet floors. Keep items that you use a lot in easy-to-reach places. If you need to reach something above you, use a strong step stool that has a grab bar. Keep electrical cords out of the way. Do not use floor polish or wax that makes floors slippery. If you must use wax, use non-skid floor wax. Do not have throw  rugs and other things on the floor that can make you trip. What can I do with my stairs? Do not leave any items on the stairs. Make sure that there are handrails on both sides of the stairs and use them. Fix handrails that are broken or loose. Make sure that handrails are as long as the stairways. Check any carpeting to make sure that it is firmly attached to the stairs. Fix any carpet that is loose or worn. Avoid having throw rugs at the top or bottom of the stairs. If you do have throw rugs, attach them to the floor with carpet tape. Make sure that you have a light switch at the top of the stairs and the bottom of the stairs. If you do not have them, ask someone to add them for you. What else can I do to help prevent falls? Wear shoes that: Do not have high heels. Have rubber bottoms. Are comfortable and fit you well. Are closed at the toe. Do not wear sandals. If you use a stepladder: Make sure that it is  fully opened. Do not climb a closed stepladder. Make sure that both sides of the stepladder are locked into place. Ask someone to hold it for you, if possible. Clearly mark and make sure that you can see: Any grab bars or handrails. First and last steps. Where the edge of each step is. Use tools that help you move around (mobility aids) if they are needed. These include: Canes. Walkers. Scooters. Crutches. Turn on the lights when you go into a dark area. Replace any light bulbs as soon as they burn out. Set up your furniture so you have a clear path. Avoid moving your furniture around. If any of your floors are uneven, fix them. If there are any pets around you, be aware of where they are. Review your medicines with your doctor. Some medicines can make you feel dizzy. This can increase your chance of falling. Ask your doctor what other things that you can do to help prevent falls. This information is not intended to replace advice given to you by your health care provider. Make sure you discuss any questions you have with your health care provider. Document Released: 03/12/2009 Document Revised: 10/22/2015 Document Reviewed: 06/20/2014 Elsevier Interactive Patient Education  2017 [employer].

## 2022-06-30 NOTE — Telephone Encounter (Signed)
2/8 is the soonest she can get it, OK to sched for then. Plz place order and make note just to check A1c, not the future CMP or lipid panel that is in there. Ty.

## 2022-06-30 NOTE — Progress Notes (Addendum)
Subjective:   Victoria Hale is a 72 y.o. female who presents for Medicare Annual (Subsequent) preventive examination.  Review of Systems    Defer to PCP Cardiac Risk Factors include: advanced age (>68men, >38 women);hypertension;dyslipidemia     Objective:    Today's Vitals   06/30/22 1305  BP: 134/61  Pulse: (!) 59  Weight: 144 lb 9.6 oz (65.6 kg)  Height: 5\' 1"  (1.549 m)   Body mass index is 27.32 kg/m.     06/30/2022    1:07 PM 06/11/2021   11:45 AM  Advanced Directives  Does Patient Have a Medical Advance Directive? No No  Would patient like information on creating a medical advance directive? No - Patient declined Yes (MAU/Ambulatory/Procedural Areas - Information given)    Current Medications (verified) Outpatient Encounter Medications as of 06/30/2022  Medication Sig   amLODipine (NORVASC) 2.5 MG tablet Take 1 tablet (2.5 mg total) by mouth daily.   atorvastatin (LIPITOR) 10 MG tablet TAKE 1 TABLET BY MOUTH DAILY   COSENTYX SENSOREADY, 300 MG, 150 MG/ML SOAJ Inject into the skin.   [DISCONTINUED] aspirin EC 81 MG tablet Take 81 mg by mouth daily. Swallow whole.   [DISCONTINUED] metFORMIN (GLUCOPHAGE) 500 MG tablet Take 1 tablet (500 mg total) by mouth daily with breakfast.   No facility-administered encounter medications on file as of 06/30/2022.    Allergies (verified) Patient has no known allergies.   History: Past Medical History:  Diagnosis Date   ALLERGIC RHINITIS 03/02/2007   ALOPECIA TOTALIS 03/02/2007   ANA POSITIVE 09/03/2009   ANOMALY, CONGENITAL, KIDNEY NEC 03/02/2007   COLONIC POLYPS, HX OF 03/02/2007   DEGENERATION, LUMBAR/LUMBOSACRAL DISC 03/02/2007   DERMATITIS, ATOPIC 03/02/2007   DYSPNEA 03/01/2007   GERD 03/02/2007   Heart murmur    Hidradenitis 03/05/2010   Hyperlipidemia    HYPERTENSION, BENIGN 11/28/2005   LACTOSE INTOLERANCE 03/02/2007   NEPHROLITHIASIS, HX OF 03/02/2007   OTITIS EXTERNA, CHRONIC NEC 03/02/2007   OVERACTIVE  BLADDER 09/03/2009   Overweight(278.02) 03/02/2007   PALPITATIONS, RECURRENT 03/02/2007   PHOTOALLERGIC DERMATITIS 03/02/2007   PSORIASIS 08/28/2008   THALASSEMIA NEC 03/02/2007   Unspecified anemia 10/09/2008   Past Surgical History:  Procedure Laterality Date   CESAREAN SECTION     exploratory abdominal surgury  1970's   s/p BTL     s/p C-section twice     Family History  Problem Relation Age of Onset   Breast cancer Mother 6       diagnosed   Heart attack Father 26   Heart disease Sister 60       had heart valve replaced   Colon cancer Neg Hx    Colon polyps Neg Hx    Esophageal cancer Neg Hx    Stomach cancer Neg Hx    Rectal cancer Neg Hx    Social History   Socioeconomic History   Marital status: Divorced    Spouse name: Not on file   Number of children: 2   Years of education: Not on file   Highest education level: Not on file  Occupational History   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  Tobacco Use   Smoking status: Former    Packs/day: 0.25    Years: 15.00    Total pack years: 3.75    Types: Cigarettes    Quit date: 07/27/1986    Years since quitting: 35.9   Smokeless tobacco: Never   Tobacco comments:    Stopped in 1988 and started  back in 2015  Vaping Use   Vaping Use: Never used  Substance and Sexual Activity   Alcohol use: Yes    Alcohol/week: 1.0 standard drink of alcohol    Types: 1 Glasses of wine per week    Comment: glass of wine weekend   Drug use: No   Sexual activity: Not Currently    Comment: 1st intercourse- 18, partners- less than 5  Other Topics Concern   Not on file  Social History Narrative   Not on file   Social Determinants of Health   Financial Resource Strain: Low Risk  (06/11/2021)   Overall Financial Resource Strain (CARDIA)    Difficulty of Paying Living Expenses: Not hard at all  Food Insecurity: No Food Insecurity (06/30/2022)   Hunger Vital Sign    Worried About Running Out of Food in the Last Year:  Never true    [place] in the Last Year: Never true  Transportation Needs: No Transportation Needs (06/30/2022)   PRAPARE - Hydrologist (Medical): No    Lack of Transportation (Non-Medical): No  Physical Activity: Inactive (06/11/2021)   Exercise Vital Sign    Days of Exercise per Week: 0 days    Minutes of Exercise per Session: 0 min  Stress: No Stress Concern Present (06/11/2021)   [place]    Feeling of Stress : Not at all  Social Connections: Moderately Isolated (06/11/2021)   Social Connection and Isolation Panel [NHANES]    Frequency of Communication with Friends and Family: More than three times a week    Frequency of Social Gatherings with Friends and Family: More than three times a week    Attends Religious Services: More than 4 times per year    Active Member of [employer] or Organizations: No    Attends Archivist Meetings: Never    Marital Status: Divorced    Tobacco Counseling Counseling given: Not Answered Tobacco comments: Stopped in 1988 and started back in 2015   Clinical Intake:  Pre-visit preparation completed: Yes  Pain : No/denies pain  Diabetes: No  How often do you need to have someone help you when you read instructions, pamphlets, or other written materials from your doctor or pharmacy?: 1 - Never  Activities of Daily Living    06/30/2022    1:13 PM  In your present state of health, do you have any difficulty performing the following activities:  Hearing? 0  Vision? 0  Difficulty concentrating or making decisions? 0  Walking or climbing stairs? 0  Dressing or bathing? 0  Doing errands, shopping? 0  Preparing Food and eating ? N  Using the Toilet? N  In the past six months, have you accidently leaked urine? Y  Comment occasionally  Do you have problems with loss of bowel control? N  Managing your Medications? N  Managing your Finances? N   Housekeeping or managing your Housekeeping? N    Patient Care Team: Shelda Pal, DO as PCP - General (Family Medicine)  Indicate any recent Medical Services you may have received from other than [facility] providers in the past year (date may be approximate).     Assessment:   This is a routine wellness examination for [name].  Hearing/Vision screen No results found.  Dietary issues and exercise activities discussed: Current Exercise Habits: The patient does not participate in regular exercise at present, Exercise limited by: None identified  Goals Addressed   None    Depression Screen    06/30/2022    1:12 PM 04/15/2022   10:02 AM 03/29/2022    2:01 PM 06/11/2021   11:49 AM 10/05/2020   12:34 PM 12/26/2019   10:53 AM 06/26/2019    2:11 PM  PHQ 2/9 Scores  PHQ - 2 Score 0 0 1 0 0 0 0  PHQ- 9 Score  1         Fall Risk    06/30/2022    1:08 PM 04/15/2022   10:02 AM 03/29/2022    2:01 PM 06/11/2021   11:48 AM 10/05/2020   12:34 PM  Fall Risk   Falls in the past year? 0 0 0 0 0  Number falls in past yr: 0 0 0 0 0  Injury with Fall? 0 0 0 0 0  Risk for fall due to : No Fall Risks No Fall Risks No Fall Risks  No Fall Risks  Follow up Falls evaluation completed Falls evaluation completed Falls evaluation completed Falls prevention discussed Falls evaluation completed    [place]:  Any stairs in or around the home? Yes  If so, are there any without handrails? No  Home free of loose throw rugs in walkways, pet beds, electrical cords, etc? Yes  Adequate lighting in your home to reduce risk of falls? Yes   ASSISTIVE DEVICES UTILIZED TO PREVENT FALLS:  Life alert? No  Use of a cane, walker or w/c? No  Grab bars in the bathroom? Yes  Shower chair or bench in shower? Yes  Elevated toilet seat or a handicapped toilet? No   TIMED UP AND GO:  Was the test performed? Yes .  Length of time to ambulate 10 feet: 5 sec.   Gait steady  and fast without use of assistive device  Cognitive Function:        06/30/2022    1:18 PM  6CIT Screen  What Year? 0 points  What month? 3 points  What time? 0 points  Count back from 20 0 points  Months in reverse 0 points  Repeat phrase 2 points  Total Score 5 points    Immunizations Immunization History  Administered Date(s) Administered   Fluad Quad(high Dose 65+) 04/13/2021, 03/29/2022   Influenza, High Dose Seasonal PF 04/19/2016   PFIZER(Purple Top)SARS-COV-2 Vaccination 07/15/2019, 08/06/2019   PNEUMOCOCCAL CONJUGATE-20 04/13/2021   Pneumococcal Conjugate-13 11/01/2017   Td 08/29/2006   Tdap 11/01/2017   Zoster Recombinat (Shingrix) 12/10/2021    TDAP status: Up to date  Flu Vaccine status: Up to date  Pneumococcal vaccine status: Up to date  Covid-19 vaccine status: Information provided on how to obtain vaccines.   Qualifies for Shingles Vaccine? Yes   Zostavax completed No   Shingrix Completed?: No.    Education has been provided regarding the importance of this vaccine. Patient has been advised to call insurance company to determine out of pocket expense if they have not yet received this vaccine. Advised may also receive vaccine at local pharmacy or Health Dept. Verbalized acceptance and understanding.  Screening Tests Health Maintenance  Topic Date Due   Zoster Vaccines- Shingrix (2 of 2) 02/04/2022   Medicare Annual Wellness (AWV)  06/11/2022   COVID-19 Vaccine (3 - Pfizer risk series) 10/14/2022 (Originally 09/03/2019)   MAMMOGRAM  03/29/2024   DTaP/Tdap/Td (3 - Td or Tdap) 11/02/2027   COLONOSCOPY (Pts 45-74yrs Insurance coverage will need to be confirmed)  11/25/2031   Pneumonia Vaccine 22+ Years old  Completed   INFLUENZA VACCINE  Completed   DEXA SCAN  Completed   Hepatitis C Screening  Completed   HPV VACCINES  Aged Out    Health Maintenance  Health Maintenance Due  Topic Date Due   Zoster Vaccines- Shingrix (2 of 2) 02/04/2022    Medicare Annual Wellness (AWV)  06/11/2022    Colorectal cancer screening: Type of screening: Colonoscopy. Completed 11/24/21. Repeat every 10 years  Mammogram status: Completed 03/29/22. Repeat every year  Bone Density status: Completed 06/28/21. Results reflect: Bone density results: NORMAL. Repeat every 2 years.  Lung Cancer Screening: (Low Dose CT Chest recommended if Age 64-80 years, 30 pack-year currently smoking OR have quit w/in 15years.) does not qualify.   Additional Screening:  Hepatitis C Screening: does qualify; Completed 10/02/15  Vision Screening: Recommended annual ophthalmology exams for early detection of glaucoma and other disorders of the eye. Is the patient up to date with their annual eye exam?  Yes  Who is the provider or what is the name of the office in which the patient attends annual eye exams? [facility] If pt is not established with a provider, would they like to be referred to a provider to establish care? No .   Dental Screening: Recommended annual dental exams for proper oral hygiene  Community Resource Referral / Chronic Care Management: CRR required this visit?  No   CCM required this visit?  No      Plan:     I have personally reviewed and noted the following in the patient's chart:   Medical and social history Use of alcohol, tobacco or illicit drugs  Current medications and supplements including opioid prescriptions. Patient is not currently taking opioid prescriptions. Functional ability and status Nutritional status Physical activity Advanced directives List of other physicians Hospitalizations, surgeries, and ER visits in previous 12 months Vitals Screenings to include cognitive, depression, and falls Referrals and appointments  In addition, I have reviewed and discussed with patient certain preventive protocols, quality metrics, and best practice recommendations. A written personalized care plan for preventive services as well as  general preventive health recommendations were provided to patient.     Beatris Ship, [place]   06/30/2022   Nurse Notes: None  Medical screening examination/treatment/procedure(s) were performed by non-physician practitioner and as supervising provider I was immediately available for consultation/collaboration.  I agree with above. Marrian Salvage, FNP

## 2022-06-30 NOTE — Telephone Encounter (Signed)
Pt called and lvm to return call 

## 2022-06-30 NOTE — Telephone Encounter (Signed)
Patient said that her last A1C was borderline and she was concerned about it and wants to come in to have it retested. Patient was advised that she has an upcoming lab appt and appt with Dr. Nani Ravens in May but patient wants to check the A1C sooner if possible.Please call and advise.

## 2022-07-01 ENCOUNTER — Other Ambulatory Visit: Payer: Self-pay | Admitting: Family Medicine

## 2022-07-01 DIAGNOSIS — R7303 Prediabetes: Secondary | ICD-10-CM

## 2022-07-01 NOTE — Telephone Encounter (Signed)
Patient called and scheduled lab appt for 07/08/22

## 2022-07-08 ENCOUNTER — Other Ambulatory Visit (INDEPENDENT_AMBULATORY_CARE_PROVIDER_SITE_OTHER): Payer: Medicare Other

## 2022-07-08 DIAGNOSIS — R7303 Prediabetes: Secondary | ICD-10-CM

## 2022-07-08 LAB — HEMOGLOBIN A1C: Hgb A1c MFr Bld: 6.1 % (ref 4.6–6.5)

## 2022-07-11 ENCOUNTER — Other Ambulatory Visit: Payer: Self-pay | Admitting: Family Medicine

## 2022-07-21 ENCOUNTER — Telehealth: Payer: Self-pay | Admitting: Orthopedic Surgery

## 2022-07-21 DIAGNOSIS — M5412 Radiculopathy, cervical region: Secondary | ICD-10-CM

## 2022-07-21 NOTE — Telephone Encounter (Signed)
Patient called. She would like to start PT here. Need a referral in epic for her.

## 2022-08-03 ENCOUNTER — Ambulatory Visit: Payer: Medicare Other | Admitting: Physical Therapy

## 2022-08-03 ENCOUNTER — Encounter: Payer: Self-pay | Admitting: Physical Therapy

## 2022-08-03 DIAGNOSIS — M6281 Muscle weakness (generalized): Secondary | ICD-10-CM

## 2022-08-03 DIAGNOSIS — G8929 Other chronic pain: Secondary | ICD-10-CM | POA: Diagnosis not present

## 2022-08-03 DIAGNOSIS — M542 Cervicalgia: Secondary | ICD-10-CM

## 2022-08-03 DIAGNOSIS — M25511 Pain in right shoulder: Secondary | ICD-10-CM | POA: Diagnosis not present

## 2022-08-03 DIAGNOSIS — R293 Abnormal posture: Secondary | ICD-10-CM | POA: Diagnosis not present

## 2022-08-03 DIAGNOSIS — M25512 Pain in left shoulder: Secondary | ICD-10-CM | POA: Diagnosis not present

## 2022-08-03 NOTE — Therapy (Signed)
OUTPATIENT PHYSICAL THERAPY CERVICAL EVALUATION   Patient Name: Victoria Hale MRN: [id number] DOB:[birth year], 71 y.o., female Today's Date: 08/03/2022  END OF SESSION:  PT End of Session - 08/03/22 1053     Visit Number 1    Number of Visits 2    Date for PT Re-Evaluation 08/31/22    Authorization Type UHC MCR    Authorization Time Period 08/03/22 to 08/31/22    Progress Note Due on Visit 10    PT Start Time 1018    PT Stop Time 1059    PT Time Calculation (min) 41 min    Activity Tolerance Patient tolerated treatment well    Behavior During Therapy WFL for tasks assessed/performed             Past Medical History:  Diagnosis Date   ALLERGIC RHINITIS 03/02/2007   ALOPECIA TOTALIS 03/02/2007   ANA POSITIVE 09/03/2009   ANOMALY, CONGENITAL, KIDNEY NEC 03/02/2007   COLONIC POLYPS, HX OF 03/02/2007   DEGENERATION, LUMBAR/LUMBOSACRAL DISC 03/02/2007   DERMATITIS, ATOPIC 03/02/2007   DYSPNEA 03/01/2007   GERD 03/02/2007   Heart murmur    Hidradenitis 03/05/2010   Hyperlipidemia    HYPERTENSION, BENIGN 11/28/2005   LACTOSE INTOLERANCE 03/02/2007   NEPHROLITHIASIS, HX OF 03/02/2007   OTITIS EXTERNA, CHRONIC NEC 03/02/2007   OVERACTIVE BLADDER 09/03/2009   Overweight(278.02) 03/02/2007   PALPITATIONS, RECURRENT 03/02/2007   PHOTOALLERGIC DERMATITIS 03/02/2007   PSORIASIS 08/28/2008   THALASSEMIA NEC 03/02/2007   Unspecified anemia 10/09/2008   Past Surgical History:  Procedure Laterality Date   CESAREAN SECTION     exploratory abdominal surgury  1970's   s/p BTL     s/p C-section twice     Patient Active Problem List   Diagnosis Date Noted   Prediabetes 08/10/2021   GAD (generalized anxiety disorder) 09/04/2020   Urge incontinence 05/06/2020   Vitamin D deficiency 06/26/2019   Smoker 12/24/2018   Cough 12/24/2018   Dyspnea 12/24/2018   Ear pain, left 10/26/2016   Neck pain 05/10/2016   Hyperglycemia 04/08/2016   Thyroid disease 04/08/2016   Rash  04/08/2016   Urinary frequency 04/08/2016   Degenerative cervical disc 10/16/2015   Pain and swelling of left shoulder 10/02/2015   Psoriasiform seborrheic dermatitis 06/26/2014   Tonsillitis 12/28/2012   Mixed hyperlipidemia 12/28/2011   Chronic LBP 11/15/2011   Neck pain on left side 11/15/2011   Odynophagia 02/18/2011   Neck pain, acute 01/24/2011   Low back pain radiating to right leg 01/24/2011   Anemia, iron deficiency 09/13/2010   Encounter for well adult exam with abnormal findings 09/10/2010   Hidradenitis 03/05/2010   OVERACTIVE BLADDER 09/03/2009   ANA positive 09/03/2009   PSORIASIS 08/28/2008   DERMATITIS, SEBORRHEIC 12/04/2007   LACTOSE INTOLERANCE 03/02/2007   OTITIS EXTERNA, CHRONIC NEC 03/02/2007   Allergic rhinitis 03/02/2007   GERD 03/02/2007   Atopic dermatitis 03/02/2007   PHOTOALLERGIC DERMATITIS 03/02/2007   ALOPECIA TOTALIS 03/02/2007   DEGENERATION, LUMBAR/LUMBOSACRAL DISC 03/02/2007   ANOMALY, CONGENITAL, KIDNEY NEC 03/02/2007   PALPITATIONS, RECURRENT 03/02/2007   COLONIC POLYPS, HX OF 03/02/2007   NEPHROLITHIASIS, HX OF 03/02/2007   Essential hypertension 11/28/2005    PCP: Riki Sheer DO   REFERRING PROVIDER: Callie Fielding, MD  REFERRING DIAG: M54.12 (ICD-10-CM) - Radiculopathy, cervical region  THERAPY DIAG:  Cervicalgia  Chronic right shoulder pain  Chronic left shoulder pain  Abnormal posture  Muscle weakness (generalized)  Rationale for Evaluation and Treatment: Rehabilitation  ONSET DATE: 07/21/2022  SUBJECTIVE:                                                                                                                                                                                                         SUBJECTIVE STATEMENT:  Neck and shoulders have been an issue. Neck was hurting, then both arms went crazy I can't reach like I should be able. Left shoulder and neck had been hurting for over a year, then the  right started hurting too, right is worse than left. Having sharp pains when reaching to get something out of a cabinet. Can't reach very far at all. Feels like a frozen feeling due to pain.   PERTINENT HISTORY:  Assessment: Patient is a 72 y.o. female with neck pain that radiates into her left shoulder and left retroauricular area.  Has no pain through shoulder range of motion and rotator cuff testing so think this could be a radiculopathy     Plan: -Explained that initially conservative treatment is tried as a significant number of patients may experience relief with these treatment modalities. Discussed that the conservative treatments include:             -activity modification             -physical therapy             -over the counter pain medications             -medrol dosepak             -cervical steroid injections -Patient has tried application of heat -Recommended physical therapy.  Patient wanted to think more about physical therapy at this time.  I told her to call the office if she wants to get started and I will put a referral in. -If she is not getting better with physical therapy, would recommend MRI of the cervical spine -Patient should return to office in 8 weeks, repeat x-rays of cervical spine at next visit: None     Patient expressed understanding of the plan and all questions were answered to the patient's satisfaction.     PAIN:  Are you having pain? No at worst 8/10 R>L shoulder   PRECAUTIONS: None  WEIGHT BEARING RESTRICTIONS: No  FALLS:  Has patient fallen in last 6 months? No  LIVING ENVIRONMENT: Lives with: lives alone Lives in: House/apartment Stairs: 2 STE no rails; no steps inside  Has following equipment at home: None  OCCUPATION: retired   PLOF: Independent, Independent with basic ADLs, Independent with gait, and Independent with transfers  PATIENT GOALS: be able to reach up overhead and not have pain   NEXT MD VISIT: Dr. Laurance Flatten PRN    OBJECTIVE:   DIAGNOSTIC FINDINGS:  Imaging: XR of the cervical spine from 03/29/2022 and 04/11/2022 was independently reviewed and interpreted, showing no fracture or dislocation. Ankylosis of the cervical spine at C3-6. Disc height loss at C4/5. No evidence of instability on flexion/extension.    CT of the cervical spine from 04/05/2022 was independently reviewed and interpreted, showing bridging syndesmophytes. Autofusion at C4/5. No fracture or dislocation.   PATIENT SURVEYS:  FOTO not taken at eval   COGNITION: Overall cognitive status: Within functional limits for tasks assessed  SENSATION: Not tested no c/o N/T by patient   POSTURE: rounded shoulders, forward head, increased thoracic kyphosis, and noted scapular winging at rest   PALPATION:  B upper traps, cervical extensors/paraspinals, periscap mm groups very tight with multiple trigger points, noted multiple trigger points in R delt all mm heads    CERVICAL ROM:   Active ROM A/PROM (deg) eval  Flexion 50*  Extension 20*  Right lateral flexion 20*  Left lateral flexion 20*  Right rotation 40*  Left rotation 40*  Thoracic flexion WNL   Thoracic extension  WNL   Thoracic rotation L/R  Moderate limitation/moderate limitation  Thoracic lateral flexion L/R  Moderate limitation/moderate limitation    (Blank rows = not tested)  UPPER EXTREMITY ROM:  Active ROM Left  eval Right  eval  Shoulder flexion 170* 130* approximately, suddenly dropped arm due to pain   Shoulder extension    Shoulder abduction 138* 112*  Shoulder adduction    Shoulder extension    Shoulder internal rotation FIR T9 FIR T9  Shoulder external rotation FER T1 FER T1   Elbow flexion    Elbow extension    Wrist flexion    Wrist extension    Wrist ulnar deviation    Wrist radial deviation    Wrist pronation    Wrist supination     (Blank rows = not tested)  UPPER EXTREMITY MMT:  MMT Right eval Left eval  Shoulder flexion 4+ 4+   Shoulder extension    Shoulder abduction 4+ 4+  Shoulder adduction    Shoulder extension 4 4  Shoulder internal rotation    Shoulder external rotation    Middle trapezius Unable to tolerate test position  DNT  Lower trapezius 2 2  Elbow flexion    Elbow extension    Wrist flexion    Wrist extension    Wrist ulnar deviation    Wrist radial deviation    Wrist pronation    Wrist supination    Grip strength     (Blank rows = not tested)  CERVICAL SPECIAL TESTS:   Empty can test: (-) B Drop arm test: (-) B  FUNCTIONAL TESTS:    TODAY'S TREATMENT:  DATE:   Eval  Objective measures + appropriate education  TherEx  UBE L1 x3 minutes forward/3 minutes backwards UT stretch 2x30 seconds B  Chin tucks x10 with 3 second holds      PATIENT EDUCATION:  Education details: POC, HEP, exam findings  Person educated: Patient Education method: Explanation, Demonstration, and Handouts Education comprehension: verbalized understanding and returned demonstration  HOME EXERCISE PROGRAM: Access Code: [email or web address] URL: [email or web address] Date: 08/03/2022 Prepared by: Deniece Ree  Exercises - Seated Gentle Upper Trapezius Stretch  - 1 x daily - 7 x weekly - 3 sets - 10 reps - Seated Cervical Retraction  - 1 x daily - 7 x weekly - 3 sets - 10 reps - Seated Shoulder Flexion Table Top Stretch  - 1 x daily - 7 x weekly - 3 sets - 10 reps - Standing 'L' Stretch at Counter  - 1 x daily - 7 x weekly - 3 sets - 10 reps  ASSESSMENT:  CLINICAL IMPRESSION: Patient is a 72 y.o. F who was seen today for physical therapy evaluation and treatment for cervical pain and B shoulder pain. Exam reveals significant cervical and thoracic stiffness, also limited ROM in B shoulders as well as postural limitations in combination with functional muscle weakness. Will  benefit from PT to address deficits but is limited by financial concerns- will plan on one more visit to determine safe gym program before going on hold.    OBJECTIVE IMPAIRMENTS: decreased ROM, decreased strength, hypomobility, increased edema, increased fascial restrictions, increased muscle spasms, impaired flexibility, postural dysfunction, and pain.   ACTIVITY LIMITATIONS: carrying, lifting, toileting, dressing, and reach over head  PARTICIPATION LIMITATIONS: meal prep, cleaning, laundry, shopping, community activity, and yard work  PERSONAL FACTORS: Age, Behavior pattern, Education, Fitness, Past/current experiences, Social background, Time since onset of injury/illness/exacerbation, and financial factors  are also affecting patient's functional outcome.   REHAB POTENTIAL: Fair significant financial concerns limiting willingness to return to therapy   CLINICAL DECISION MAKING: Stable/uncomplicated  EVALUATION COMPLEXITY: Low   GOALS: Goals reviewed with patient? Yes  SHORT TERM GOALS: Target date: 08/24/2022    Will be compliant with appropriate extensive HEP for self management of condition Baseline:  Goal status: INITIAL  2.  Will be compliant with appropriate gym regimen that will not aggravate her pain or worsen condition  Baseline:  Goal status: INITIAL  3.  Pain to be no more than 4/10 at worst  Baseline:  Goal status: INITIAL    LONG TERM GOALS: Target date: no LTGs appropriate at this point, will re-assess and update if patient decides she is able to continue with therapy       PLAN:  PT FREQUENCY:  one additional session  PT DURATION: 4 weeks  PLANNED INTERVENTIONS: Therapeutic exercises, Therapeutic activity, Neuromuscular re-education, Balance training, Gait training, Patient/Family education, Self Care, Joint mobilization, Dry Needling, Electrical stimulation, Spinal mobilization, Cryotherapy, Moist heat, Taping, Ultrasound, Ionotophoresis '4mg'$ /ml  Dexamethasone, Manual therapy, and Re-evaluation  PLAN FOR NEXT SESSION: need to go over what she can safely do at the gym without making anything worse- only able to come back to one session financially and feels this would be most beneficial for her   Deniece Ree PT DPT PN2

## 2022-08-17 ENCOUNTER — Encounter: Payer: Self-pay | Admitting: Physical Therapy

## 2022-08-17 ENCOUNTER — Ambulatory Visit: Payer: Medicare Other | Admitting: Physical Therapy

## 2022-08-17 DIAGNOSIS — R293 Abnormal posture: Secondary | ICD-10-CM | POA: Diagnosis not present

## 2022-08-17 DIAGNOSIS — M6281 Muscle weakness (generalized): Secondary | ICD-10-CM | POA: Diagnosis not present

## 2022-08-17 DIAGNOSIS — M25512 Pain in left shoulder: Secondary | ICD-10-CM | POA: Diagnosis not present

## 2022-08-17 DIAGNOSIS — G8929 Other chronic pain: Secondary | ICD-10-CM | POA: Diagnosis not present

## 2022-08-17 DIAGNOSIS — M542 Cervicalgia: Secondary | ICD-10-CM

## 2022-08-17 DIAGNOSIS — M25511 Pain in right shoulder: Secondary | ICD-10-CM

## 2022-08-17 NOTE — Therapy (Signed)
OUTPATIENT PHYSICAL THERAPY CERVICAL EVALUATION   Patient Name: Victoria Hale MRN: [id number] DOB:March 17, 1951, 72 y.o., female Today's Date: 08/17/2022  PHYSICAL THERAPY DISCHARGE SUMMARY  Visits from Start of Care: 2  Current functional level related to goals / functional outcomes: See below    Remaining deficits: See below    Education / Equipment: See below    Patient agrees to discharge. Patient goals were not met. Patient is being discharged due to the patient's request.   END OF SESSION:  PT End of Session - 08/17/22 1026     Visit Number 2    Number of Visits 2    Date for PT Re-Evaluation 08/31/22    Authorization Type [employer] MCR    Authorization Time Period 08/03/22 to 08/31/22    Progress Note Due on Visit 10    PT Start Time 1019    PT Stop Time 1047   all goals of session complete   PT Time Calculation (min) 28 min    Activity Tolerance Patient tolerated treatment well    Behavior During Therapy WFL for tasks assessed/performed              Past Medical History:  Diagnosis Date   ALLERGIC RHINITIS 03/02/2007   ALOPECIA TOTALIS 03/02/2007   ANA POSITIVE 09/03/2009   ANOMALY, CONGENITAL, KIDNEY NEC 03/02/2007   COLONIC POLYPS, HX OF 03/02/2007   DEGENERATION, LUMBAR/LUMBOSACRAL DISC 03/02/2007   DERMATITIS, ATOPIC 03/02/2007   DYSPNEA 03/01/2007   GERD 03/02/2007   Heart murmur    Hidradenitis 03/05/2010   Hyperlipidemia    HYPERTENSION, BENIGN 11/28/2005   LACTOSE INTOLERANCE 03/02/2007   NEPHROLITHIASIS, HX OF 03/02/2007   OTITIS EXTERNA, CHRONIC NEC 03/02/2007   OVERACTIVE BLADDER 09/03/2009   Overweight(278.02) 03/02/2007   PALPITATIONS, RECURRENT 03/02/2007   PHOTOALLERGIC DERMATITIS 03/02/2007   PSORIASIS 08/28/2008   THALASSEMIA NEC 03/02/2007   Unspecified anemia 10/09/2008   Past Surgical History:  Procedure Laterality Date   CESAREAN SECTION     exploratory abdominal surgury  1970's   s/p BTL     s/p C-section twice      Patient Active Problem List   Diagnosis Date Noted   Prediabetes 08/10/2021   GAD (generalized anxiety disorder) 09/04/2020   Urge incontinence 05/06/2020   Vitamin D deficiency 06/26/2019   Smoker 12/24/2018   Cough 12/24/2018   Dyspnea 12/24/2018   Ear pain, left 10/26/2016   Neck pain 05/10/2016   Hyperglycemia 04/08/2016   Thyroid disease 04/08/2016   Rash 04/08/2016   Urinary frequency 04/08/2016   Degenerative cervical disc 10/16/2015   Pain and swelling of left shoulder 10/02/2015   Psoriasiform seborrheic dermatitis 06/26/2014   Tonsillitis 12/28/2012   Mixed hyperlipidemia 12/28/2011   Chronic LBP 11/15/2011   Neck pain on left side 11/15/2011   Odynophagia 02/18/2011   Neck pain, acute 01/24/2011   Low back pain radiating to right leg 01/24/2011   Anemia, iron deficiency 09/13/2010   Encounter for well adult exam with abnormal findings 09/10/2010   Hidradenitis 03/05/2010   OVERACTIVE BLADDER 09/03/2009   ANA positive 09/03/2009   PSORIASIS 08/28/2008   DERMATITIS, SEBORRHEIC 12/04/2007   LACTOSE INTOLERANCE 03/02/2007   OTITIS EXTERNA, CHRONIC NEC 03/02/2007   Allergic rhinitis 03/02/2007   GERD 03/02/2007   Atopic dermatitis 03/02/2007   PHOTOALLERGIC DERMATITIS 03/02/2007   ALOPECIA TOTALIS 03/02/2007   DEGENERATION, LUMBAR/LUMBOSACRAL DISC 03/02/2007   ANOMALY, CONGENITAL, KIDNEY NEC 03/02/2007   PALPITATIONS, RECURRENT 03/02/2007   COLONIC POLYPS, HX OF 03/02/2007  NEPHROLITHIASIS, HX OF 03/02/2007   Essential hypertension 11/28/2005    PCP: Riki Sheer DO   REFERRING PROVIDER: Callie Fielding, MD  REFERRING DIAG: [phone] (ICD-10-CM) - Radiculopathy, cervical region  THERAPY DIAG:  Cervicalgia  Chronic right shoulder pain  Chronic left shoulder pain  Muscle weakness (generalized)  Abnormal posture  Rationale for Evaluation and Treatment: Rehabilitation  ONSET DATE: 07/21/2022  SUBJECTIVE:                                                                                                                                                                                                          SUBJECTIVE STATEMENT:  I was feeling fine then I tried to toss a stick over into the trash, then I got in the car and came in over here. I didn't do the HEP you gave me because I didn't see it doing any good. I didn't try them much. Haven't been to the gym.   PERTINENT HISTORY:  Assessment: Patient is a 72 y.o. female with neck pain that radiates into her left shoulder and left retroauricular area.  Has no pain through shoulder range of motion and rotator cuff testing so think this could be a radiculopathy     Plan: -Explained that initially conservative treatment is tried as a significant number of patients may experience relief with these treatment modalities. Discussed that the conservative treatments include:             -activity modification             -physical therapy             -over the counter pain medications             -medrol dosepak             -cervical steroid injections -Patient has tried application of heat -Recommended physical therapy.  Patient wanted to think more about physical therapy at this time.  I told her to call the office if she wants to get started and I will put a referral in. -If she is not getting better with physical therapy, would recommend MRI of the cervical spine -Patient should return to office in 8 weeks, repeat x-rays of cervical spine at next visit: None     Patient expressed understanding of the plan and all questions were answered to the patient's satisfaction.     PAIN:  Are you having pain? No at worst 8/10 R>L shoulder   PRECAUTIONS: None  WEIGHT BEARING RESTRICTIONS: No  FALLS:  Has patient fallen in  last 6 months? No  LIVING ENVIRONMENT: Lives with: lives alone Lives in: House/apartment Stairs: 2 STE no rails; no steps inside  Has following equipment at home:  None  OCCUPATION: retired   PLOF: Independent, Independent with basic ADLs, Independent with gait, and Independent with transfers  PATIENT GOALS: be able to reach up overhead and not have pain   NEXT MD VISIT: Dr. Laurance Flatten PRN   OBJECTIVE:   DIAGNOSTIC FINDINGS:  Imaging: XR of the cervical spine from 03/29/2022 and 04/11/2022 was independently reviewed and interpreted, showing no fracture or dislocation. Ankylosis of the cervical spine at C3-6. Disc height loss at C4/5. No evidence of instability on flexion/extension.    CT of the cervical spine from 04/05/2022 was independently reviewed and interpreted, showing bridging syndesmophytes. Autofusion at C4/5. No fracture or dislocation.   PATIENT SURVEYS:  FOTO not taken at eval   COGNITION: Overall cognitive status: Within functional limits for tasks assessed  SENSATION: Not tested no c/o N/T by patient   POSTURE: rounded shoulders, forward head, increased thoracic kyphosis, and noted scapular winging at rest   PALPATION:  B upper traps, cervical extensors/paraspinals, periscap mm groups very tight with multiple trigger points, noted multiple trigger points in R delt all mm heads    CERVICAL ROM:   Active ROM A/PROM (deg) eval  Flexion 50*  Extension 20*  Right lateral flexion 20*  Left lateral flexion 20*  Right rotation 40*  Left rotation 40*  Thoracic flexion WNL   Thoracic extension  WNL   Thoracic rotation L/R  Moderate limitation/moderate limitation  Thoracic lateral flexion L/R  Moderate limitation/moderate limitation    (Blank rows = not tested)  UPPER EXTREMITY ROM:  Active ROM Left  eval Right  eval  Shoulder flexion 170* 130* approximately, suddenly dropped arm due to pain   Shoulder extension    Shoulder abduction 138* 112*  Shoulder adduction    Shoulder extension    Shoulder internal rotation FIR T9 FIR T9  Shoulder external rotation FER T1 FER T1   Elbow flexion    Elbow extension    Wrist  flexion    Wrist extension    Wrist ulnar deviation    Wrist radial deviation    Wrist pronation    Wrist supination     (Blank rows = not tested)  UPPER EXTREMITY MMT:  MMT Right eval Left eval  Shoulder flexion 4+ 4+  Shoulder extension    Shoulder abduction 4+ 4+  Shoulder adduction    Shoulder extension 4 4  Shoulder internal rotation    Shoulder external rotation    Middle trapezius Unable to tolerate test position  DNT  Lower trapezius 2 2  Elbow flexion    Elbow extension    Wrist flexion    Wrist extension    Wrist ulnar deviation    Wrist radial deviation    Wrist pronation    Wrist supination    Grip strength     (Blank rows = not tested)  CERVICAL SPECIAL TESTS:   Empty can test: (-) B Drop arm test: (-) B  FUNCTIONAL TESTS:    TODAY'S TREATMENT:  DATE:   08/17/22  TherEx  UBE L5 x3 min forward/3 min backward  Limited range rows 15# x10 with 3 second holds Lat pull downs x10 15# Tricep pull down x10 15# ER 5# at weight tower x10 B  Education as below     Eval  Objective measures + appropriate education  TherEx  UBE L1 x3 minutes forward/3 minutes backwards UT stretch 2x30 seconds B  Chin tucks x10 with 3 second holds      PATIENT EDUCATION:  Education details: DC today, gym machines and parameters/weight recommendations, encouraged her to work with staff at [employer] with questions on specific models/machines at that facility as they are likely different from what we have here, encouraged HEP compliance, do not force any exercise through pain. Productive vs non-productive pains with exercise  Person educated: Patient Education method: Explanation, Demonstration, and Handouts Education comprehension: verbalized understanding and returned demonstration  HOME EXERCISE PROGRAM:  Access Code: [email or web address] URL:  [email or web address] Date: 08/17/2022 Prepared by: Deniece Ree  Exercises - Seated Gentle Upper Trapezius Stretch  - 1 x daily - 7 x weekly - 3 sets - 10 reps - Seated Cervical Retraction  - 1 x daily - 7 x weekly - 3 sets - 10 reps - Seated Shoulder Flexion Table Top Stretch  - 1 x daily - 7 x weekly - 3 sets - 10 reps - Standing 'L' Stretch at Counter  - 1 x daily - 7 x weekly - 3 sets - 10 reps - Seated Arm Bike  - 1 x daily - 7 x weekly - 3 sets - 10 reps - Seated Row Cable Machine  - 1 x daily - 7 x weekly - 3 sets - 10 reps - Lat Pull Down  - 1 x daily - 7 x weekly - 3 sets - 10 reps - Triceps Extension   - 1 x daily - 7 x weekly - 3 sets - 10 reps - Standing Cable Shoulder External Rotation   - 1 x daily - 7 x weekly - 3 sets - 10 reps  ASSESSMENT:  CLINICAL IMPRESSION:  Donnalyn arrives today doing OK. Hurt her shoulder again trying to toss a stick into the trash, but pain has calmed down now. Has not been compliant with HEP at all, tells me "I did not see how they were going to help me". Focused on assigning appropriate gym activities today per her request. Unsure of future compliance to PT recommendations moving forward. DC today, goals unmet- will need new MD referral to return.   OBJECTIVE IMPAIRMENTS: decreased ROM, decreased strength, hypomobility, increased edema, increased fascial restrictions, increased muscle spasms, impaired flexibility, postural dysfunction, and pain.   ACTIVITY LIMITATIONS: carrying, lifting, toileting, dressing, and reach over head  PARTICIPATION LIMITATIONS: meal prep, cleaning, laundry, shopping, community activity, and yard work  PERSONAL FACTORS: Age, Behavior pattern, Education, Fitness, Past/current experiences, Social background, Time since onset of injury/illness/exacerbation, and financial factors  are also affecting patient's functional outcome.   REHAB POTENTIAL: Fair significant financial concerns limiting willingness to  return to therapy   CLINICAL DECISION MAKING: Stable/uncomplicated  EVALUATION COMPLEXITY: Low   GOALS: Goals reviewed with patient? Yes  SHORT TERM GOALS: Target date: 08/24/2022    Will be compliant with appropriate extensive HEP for self management of condition Baseline:  Goal status: NOT MET  2.  Will be compliant with appropriate gym regimen that will not aggravate her pain or worsen condition  Baseline:  Goal status: NOT MET  3.  Pain to be no more than 4/10 at worst  Baseline:  Goal status: NOT MET    LONG TERM GOALS: Target date: no LTGs appropriate at this point, will re-assess and update if patient decides she is able to continue with therapy       PLAN:  PT FREQUENCY:  one additional session  PT DURATION: 4 weeks  PLANNED INTERVENTIONS: Therapeutic exercises, Therapeutic activity, Neuromuscular re-education, Balance training, Gait training, Patient/Family education, Self Care, Joint mobilization, Dry Needling, Electrical stimulation, Spinal mobilization, Cryotherapy, Moist heat, Taping, Ultrasound, Ionotophoresis 4mg /ml Dexamethasone, Manual therapy, and Re-evaluation  PLAN FOR NEXT SESSION: DC   Deniece Ree PT DPT PN2

## 2022-09-09 DIAGNOSIS — L400 Psoriasis vulgaris: Secondary | ICD-10-CM | POA: Diagnosis not present

## 2022-09-09 DIAGNOSIS — Z79899 Other long term (current) drug therapy: Secondary | ICD-10-CM | POA: Diagnosis not present

## 2022-10-05 ENCOUNTER — Other Ambulatory Visit (INDEPENDENT_AMBULATORY_CARE_PROVIDER_SITE_OTHER): Payer: Medicare Other

## 2022-10-05 DIAGNOSIS — E782 Mixed hyperlipidemia: Secondary | ICD-10-CM

## 2022-10-05 DIAGNOSIS — R7303 Prediabetes: Secondary | ICD-10-CM

## 2022-10-05 LAB — LIPID PANEL
Cholesterol: 134 mg/dL (ref 0–200)
HDL: 51.5 mg/dL (ref >39.00)
LDL Cholesterol: 72 mg/dL (ref 0–99)
NonHDL: 82.1
Total CHOL/HDL Ratio: 3
Triglycerides: 51 mg/dL (ref 0.0–149.0)
VLDL: 10.2 mg/dL (ref 0.0–40.0)

## 2022-10-05 LAB — COMPREHENSIVE METABOLIC PANEL
ALT: 11 U/L (ref 0–35)
AST: 16 U/L (ref 0–37)
Albumin: 3.7 g/dL (ref 3.5–5.2)
Alkaline Phosphatase: 80 U/L (ref 39–117)
BUN: 13 mg/dL (ref 6–23)
CO2: 30 mEq/L (ref 19–32)
Calcium: 8.8 mg/dL (ref 8.4–10.5)
Chloride: 103 mEq/L (ref 96–112)
Creatinine, Ser: 0.68 mg/dL (ref 0.40–1.20)
GFR: 87.08 mL/min (ref >60.00)
Glucose, Bld: 91 mg/dL (ref 70–99)
Potassium: 4.6 mEq/L (ref 3.5–5.1)
Sodium: 139 mEq/L (ref 135–145)
Total Bilirubin: 0.5 mg/dL (ref 0.2–1.2)
Total Protein: 7.1 g/dL (ref 6.0–8.3)

## 2022-10-05 LAB — HEMOGLOBIN A1C: Hgb A1c MFr Bld: 6.1 % (ref 4.6–6.5)

## 2022-10-12 ENCOUNTER — Ambulatory Visit: Payer: Medicare Other | Admitting: Family Medicine

## 2022-10-12 ENCOUNTER — Encounter: Payer: Self-pay | Admitting: Family Medicine

## 2022-10-12 ENCOUNTER — Ambulatory Visit (INDEPENDENT_AMBULATORY_CARE_PROVIDER_SITE_OTHER): Payer: Medicare Other | Admitting: Family Medicine

## 2022-10-12 VITALS — BP 134/56 | HR 69 | Temp 98.4°F | Ht 61.0 in | Wt 149.4 lb

## 2022-10-12 DIAGNOSIS — I10 Essential (primary) hypertension: Secondary | ICD-10-CM

## 2022-10-12 DIAGNOSIS — R7303 Prediabetes: Secondary | ICD-10-CM

## 2022-10-12 DIAGNOSIS — E782 Mixed hyperlipidemia: Secondary | ICD-10-CM

## 2022-10-12 NOTE — Progress Notes (Signed)
Chief Complaint  Patient presents with   Follow-up    Subjective Victoria Hale is a 72 y.o. female who presents for hypertension follow up. She does not monitor home blood pressures. She is compliant with medication- Norvasc 2.5 mg/d. Patient has these side effects of medication: none She is sometimes adhering to a healthy diet overall. Current exercise: dance classes, lifting weights, cycling No CP or SOB.   Mixed Hyperlipidemia Patient presents for mixed hyperlipidemia follow up. Currently being treated with Lipitor 10 mg/d and compliance with treatment thus far has been good. She denies myalgias. Diet/exercise as above.  The patient is not known to have coexisting coronary artery disease.   Past Medical History:  Diagnosis Date   ALLERGIC RHINITIS 03/02/2007   ALOPECIA TOTALIS 03/02/2007   ANA POSITIVE 09/03/2009   ANOMALY, CONGENITAL, KIDNEY NEC 03/02/2007   COLONIC POLYPS, HX OF 03/02/2007   DEGENERATION, LUMBAR/LUMBOSACRAL DISC 03/02/2007   DERMATITIS, ATOPIC 03/02/2007   DYSPNEA 03/01/2007   GERD 03/02/2007   Heart murmur    Hidradenitis 03/05/2010   Hyperlipidemia    HYPERTENSION, BENIGN 11/28/2005   LACTOSE INTOLERANCE 03/02/2007   NEPHROLITHIASIS, HX OF 03/02/2007   OTITIS EXTERNA, CHRONIC NEC 03/02/2007   OVERACTIVE BLADDER 09/03/2009   Overweight(278.02) 03/02/2007   PALPITATIONS, RECURRENT 03/02/2007   PHOTOALLERGIC DERMATITIS 03/02/2007   PSORIASIS 08/28/2008   THALASSEMIA NEC 03/02/2007   Unspecified anemia 10/09/2008    Exam BP (!) 134/56 (BP Location: Left Arm, Cuff Size: Normal)   Pulse 69   Temp 98.4 F (36.9 C) (Oral)   Ht 5\' 1"  (1.549 m)   Wt 149 lb 6 oz (67.8 kg)   SpO2 98%   BMI 28.22 kg/m  General:  well developed, well nourished, in no apparent distress Heart: RRR, no bruits, no LE edema Lungs: clear to auscultation, no accessory muscle use Psych: well oriented with normal range of affect and appropriate  judgment/insight  Essential hypertension  Mixed hyperlipidemia - Plan: Comprehensive metabolic panel, Lipid panel  Prediabetes - Plan: Hemoglobin A1c  Chronic, stable. Cont Norvasc 2.5 mg/d. BP elevated initially but normalized on reck. She will monitor BP at home over next mo and let me know the overall readings on MyChart.  Counseled on diet and exercise. Chronic, stable. Cont Lipitor 10 mg/d.  Ck labs 1 week prior to next visit. F/u in 6 mo. The patient voiced understanding and agreement to the plan.  Victoria Roche [name], DO 10/12/22  2:09 PM

## 2022-10-12 NOTE — Patient Instructions (Addendum)
Keep the diet clean and stay active.  For the next month, check your blood pressures 2-3 times per week, alternating the time of day you check it. If it is high, considering waiting 1-2 minutes and rechecking. If it gets higher, your anxiety is likely creeping up and we should avoid rechecking.   I want your blood pressure less than 140 on the top and less than 90 on the bottom consistently. Both goals must be met (ie, 150/70 is too high even though the 70 on the bottom is desirable).   Send me a message with your results next month on MyChart.   Let us know if you need anything.

## 2023-01-02 ENCOUNTER — Encounter: Payer: Self-pay | Admitting: Family Medicine

## 2023-01-02 ENCOUNTER — Telehealth: Payer: Self-pay | Admitting: Family Medicine

## 2023-01-02 ENCOUNTER — Ambulatory Visit (INDEPENDENT_AMBULATORY_CARE_PROVIDER_SITE_OTHER): Payer: Medicare Other | Admitting: Family Medicine

## 2023-01-02 VITALS — BP 128/78 | HR 48 | Temp 98.0°F | Ht 61.0 in | Wt 144.1 lb

## 2023-01-02 DIAGNOSIS — J069 Acute upper respiratory infection, unspecified: Secondary | ICD-10-CM | POA: Diagnosis not present

## 2023-01-02 MED ORDER — BENZONATATE 200 MG PO CAPS: 200.0000 mg | ORAL_CAPSULE | Freq: Two times a day (BID) | ORAL | 0 refills | Status: DC | PRN

## 2023-01-02 MED ORDER — ALBUTEROL SULFATE HFA 108 (90 BASE) MCG/ACT IN AERS: 2.0000 | INHALATION_SPRAY | Freq: Four times a day (QID) | RESPIRATORY_TRACT | 0 refills | Status: DC | PRN

## 2023-01-02 NOTE — Telephone Encounter (Signed)
[facility] Day - Client TELEPHONE ADVICE RECORDAccessNurse Patient Name First: Victoria Last: Hale Initial Comment Caller states she is experiencing a cold with chest congestion, dizziness, and a cough. Translation No Nurse Assessment Nurse: Isabella Bowens, RN, Katlin Date/Time (Eastern Time): 01/02/2023 9:23:41 AM Confirm and document reason for call. If symptomatic, describe symptoms. ---Caller states she is having cough, congestion, dizziness, sweatiness, and SOB with exertion. She does not know if she has fever. Onset 10 days ago. She states she is not feeling worse but she is not feeling better. Does the patient have any new or worsening symptoms? ---Yes Will a triage be completed? ---Yes Related visit to physician within the last 2 weeks? ---No Does the PT have any chronic conditions? (i.e. diabetes, asthma, this includes High risk factors for pregnancy, etc.) ---No Is this a behavioral health or substance abuse call? ---No Guidelines Guideline Title Affirmed Question Affirmed Notes Nurse Date/Time (Eastern Time) COVID-19 - Diagnosed or Suspected MILD difficulty breathing (e.g., minimal/no SOB at rest, SOB with walking, pulse <100) Belac, RN, Katlin 01/02/2023 9:27:24 AM Disp. Time Lamount Cohen Time) Disposition Final User 01/02/2023 9:30:54 AM See HCP within 4 Hours (or PCP triage) Yes Belac, RN, Katlin PLEASE NOTE: All timestamps contained within this report are represented as [place] Standard Time. CONFIDENTIALTY NOTICE: This fax transmission is intended only for the addressee. It contains information that is legally privileged, confidential or otherwise protected from use or disclosure. If you are not the intended recipient, you are strictly prohibited from reviewing, disclosing, copying using or disseminating any of this information or taking any action in reliance on or regarding this information. If you have received this fax in error, please notify us  immediately by telephone so that we can arrange for its return to [place]. Phone: [phone], Toll-Free: [phone], Fax: [phone] Page: 2 of 2 Call Id: [id number] Final Disposition 01/02/2023 9:30:54 AM See HCP within 4 Hours (or PCP triage) Yes Belac, RN, Denyse Amass Disagree/Comply Comply Caller Understands Yes PreDisposition Call Doctor Care Advice Given Per Guideline SEE HCP (OR PCP TRIAGE) WITHIN 4 HOURS: * IF OFFICE WILL BE OPEN: You need to be seen within the next 3 or 4 hours. Call your doctor (or NP/PA) now or as soon as the office opens. * Cough: Use cough drops. * Feeling dehydrated: Drink extra liquids. If the air in your home is dry, use a humidifier. CALL BACK IF: * You become worse CARE ADVICE given per COVID-19 - DIAGNOSED OR SUSPECTED (Adult) guideline. Comments User: Gardiner Fanti, RN Date/Time Lamount Cohen Time): 01/02/2023 9:26:25 AM Caller states that she has not tested for COVID. She does not know if she has been exposed or not. User: Gardiner Fanti, RN Date/Time Lamount Cohen Time): 01/02/2023 9:33:33 AM Caller warm transferred for appt. Referrals REFERRED TO PCP OFFIC

## 2023-01-02 NOTE — Progress Notes (Signed)
Chief Complaint  Patient presents with   Cough    Congestion Light headed Sweating All symptoms for 10 days    Victoria Hale here for URI complaints.  Duration:  1.5 weeks   Associated symptoms: sinus headache, sinus congestion, shortness of breath, chest tightness, and coughing Denies: sinus pain, rhinorrhea, itchy watery eyes, ear pain, ear drainage, sore throat, wheezing, myalgia, and fevers, loss of taste/smell, N/V/D Treatment to date: Mucinex Sick contacts: No  Past Medical History:  Diagnosis Date   ALLERGIC RHINITIS 03/02/2007   ALOPECIA TOTALIS 03/02/2007   ANA POSITIVE 09/03/2009   ANOMALY, CONGENITAL, KIDNEY NEC 03/02/2007   COLONIC POLYPS, HX OF 03/02/2007   DEGENERATION, LUMBAR/LUMBOSACRAL DISC 03/02/2007   DERMATITIS, ATOPIC 03/02/2007   DYSPNEA 03/01/2007   GERD 03/02/2007   Heart murmur    Hidradenitis 03/05/2010   Hyperlipidemia    HYPERTENSION, BENIGN 11/28/2005   LACTOSE INTOLERANCE 03/02/2007   NEPHROLITHIASIS, HX OF 03/02/2007   OTITIS EXTERNA, CHRONIC NEC 03/02/2007   OVERACTIVE BLADDER 09/03/2009   Overweight(278.02) 03/02/2007   PALPITATIONS, RECURRENT 03/02/2007   PHOTOALLERGIC DERMATITIS 03/02/2007   PSORIASIS 08/28/2008   THALASSEMIA NEC 03/02/2007   Unspecified anemia 10/09/2008    Objective BP 128/78 (BP Location: Left Arm, Patient Position: Sitting, Cuff Size: Normal)   Pulse (!) 48   Temp 98 F (36.7 C) (Oral)   Ht 5\' 1"  (1.549 m)   Wt 144 lb 2 oz (65.4 kg)   SpO2 99%   BMI 27.23 kg/m  General: Awake, alert, appears stated age HEENT: AT, [place], ears patent b/l and TM's neg, nares patent w/o discharge, pharynx pink and without exudates, MMM Neck: No masses or asymmetry Heart: RRR Lungs: CTAB, no accessory muscle use Psych: Age appropriate judgment and insight, normal mood and affect  Viral URI with cough - Plan: albuterol (VENTOLIN HFA) 108 (90 Base) MCG/ACT inhaler, benzonatate (TESSALON) 200 MG capsule  Reports tightness,  will see if a SABA can help. Tessalon Perles prn. Continue to push fluids, practice good hand hygiene, cover mouth when coughing. F/u prn. If starting to experience fevers, shaking, or shortness of breath, seek immediate care. Pt voiced understanding and agreement to the plan.  Victoria Roche [place], DO 01/02/23 1:54 PM

## 2023-01-02 NOTE — Telephone Encounter (Signed)
Appointment with PCP scheduled today 01/02/2023

## 2023-01-02 NOTE — Telephone Encounter (Signed)
FYI: This call has been transferred to triage nurse: the Triage Nurse. Once the result note has been entered staff can address the message at that time.  Patient called in with the following symptoms:  Red Word:chest pain and Lightheadedness   Please advise at Mobile [phone] (mobile)  Message is routed to Provider Pool.

## 2023-01-02 NOTE — Patient Instructions (Signed)
Continue to push fluids, practice good hand hygiene, and cover your mouth if you cough.  If you start having fevers, shaking or shortness of breath, seek immediate care.  OK to take Tylenol 1000 mg (2 extra strength tabs) or 975 mg (3 regular strength tabs) every 6 hours as needed.  Send me a message if we aren't turning the corner in the next few days.  Let us know if you need anything.

## 2023-01-27 ENCOUNTER — Other Ambulatory Visit: Payer: Self-pay | Admitting: Family Medicine

## 2023-01-27 MED ORDER — ATORVASTATIN CALCIUM 10 MG PO TABS: ORAL_TABLET | ORAL | 3 refills | Status: DC

## 2023-03-03 ENCOUNTER — Emergency Department (HOSPITAL_COMMUNITY): Payer: Medicare Other

## 2023-03-03 ENCOUNTER — Other Ambulatory Visit: Payer: Self-pay

## 2023-03-03 ENCOUNTER — Emergency Department (HOSPITAL_COMMUNITY)
Admission: EM | Admit: 2023-03-03 | Discharge: 2023-03-04 | Disposition: A | Discharge status: Home / Self Care | Payer: Medicare Other | Attending: Emergency Medicine | Admitting: Emergency Medicine

## 2023-03-03 ENCOUNTER — Encounter (HOSPITAL_COMMUNITY): Payer: Self-pay

## 2023-03-03 DIAGNOSIS — R9389 Abnormal findings on diagnostic imaging of other specified body structures: Secondary | ICD-10-CM | POA: Insufficient documentation

## 2023-03-03 DIAGNOSIS — Y92410 Unspecified street and highway as the place of occurrence of the external cause: Secondary | ICD-10-CM | POA: Insufficient documentation

## 2023-03-03 DIAGNOSIS — Z041 Encounter for examination and observation following transport accident: Secondary | ICD-10-CM | POA: Diagnosis not present

## 2023-03-03 DIAGNOSIS — M542 Cervicalgia: Secondary | ICD-10-CM | POA: Diagnosis not present

## 2023-03-03 DIAGNOSIS — M19031 Primary osteoarthritis, right wrist: Secondary | ICD-10-CM | POA: Diagnosis not present

## 2023-03-03 DIAGNOSIS — I700 Atherosclerosis of aorta: Secondary | ICD-10-CM | POA: Diagnosis not present

## 2023-03-03 DIAGNOSIS — M47814 Spondylosis without myelopathy or radiculopathy, thoracic region: Secondary | ICD-10-CM | POA: Diagnosis not present

## 2023-03-03 DIAGNOSIS — R0789 Other chest pain: Secondary | ICD-10-CM | POA: Insufficient documentation

## 2023-03-03 DIAGNOSIS — M47812 Spondylosis without myelopathy or radiculopathy, cervical region: Secondary | ICD-10-CM | POA: Diagnosis not present

## 2023-03-03 DIAGNOSIS — R079 Chest pain, unspecified: Secondary | ICD-10-CM | POA: Diagnosis not present

## 2023-03-03 DIAGNOSIS — Q632 Ectopic kidney: Secondary | ICD-10-CM | POA: Diagnosis not present

## 2023-03-03 LAB — I-STAT CHEM 8, ED
BUN: 16 mg/dL (ref 8–23)
Calcium, Ion: 1.22 mmol/L (ref 1.15–1.40)
Chloride: 104 mmol/L (ref 98–111)
Creatinine, Ser: 0.8 mg/dL (ref 0.44–1.00)
Glucose, Bld: 99 mg/dL (ref 70–99)
HCT: 42 % (ref 36.0–46.0)
Hemoglobin: 14.3 g/dL (ref 12.0–15.0)
Potassium: 4.5 mmol/L (ref 3.5–5.1)
Sodium: 142 mmol/L (ref 135–145)
TCO2: 28 mmol/L (ref 22–32)

## 2023-03-03 MED ORDER — IOHEXOL 350 MG/ML SOLN
75.0000 mL | Freq: Once | INTRAVENOUS | Status: AC | PRN
  Administered 2023-03-03: 75 mL via INTRAVENOUS

## 2023-03-03 MED ORDER — LIDOCAINE 5 % EX PTCH: 1.0000 | MEDICATED_PATCH | CUTANEOUS | 0 refills | Status: DC

## 2023-03-03 MED ORDER — LIDOCAINE 5 % EX PTCH
1.0000 | MEDICATED_PATCH | CUTANEOUS | Status: DC
  Administered 2023-03-03: 1 via TRANSDERMAL
  Filled 2023-03-03: qty 1
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
## 2023-03-07 ENCOUNTER — Other Ambulatory Visit (HOSPITAL_BASED_OUTPATIENT_CLINIC_OR_DEPARTMENT_OTHER): Payer: Self-pay

## 2023-03-07 ENCOUNTER — Encounter: Payer: Self-pay | Admitting: Family Medicine

## 2023-03-07 ENCOUNTER — Ambulatory Visit (INDEPENDENT_AMBULATORY_CARE_PROVIDER_SITE_OTHER): Payer: Medicare Other | Admitting: Family Medicine

## 2023-03-07 VITALS — BP 128/72 | HR 65 | Temp 99.0°F | Ht 61.0 in | Wt 145.4 lb

## 2023-03-07 DIAGNOSIS — K219 Gastro-esophageal reflux disease without esophagitis: Secondary | ICD-10-CM

## 2023-03-07 DIAGNOSIS — Z1231 Encounter for screening mammogram for malignant neoplasm of breast: Secondary | ICD-10-CM | POA: Diagnosis not present

## 2023-03-07 DIAGNOSIS — R9389 Abnormal findings on diagnostic imaging of other specified body structures: Secondary | ICD-10-CM

## 2023-03-07 DIAGNOSIS — M542 Cervicalgia: Secondary | ICD-10-CM | POA: Diagnosis not present

## 2023-03-07 MED ORDER — TIZANIDINE HCL 4 MG PO TABS
4.0000 mg | ORAL_TABLET | Freq: Four times a day (QID) | ORAL | 0 refills | Status: DC | PRN
  Filled 2023-03-07: qty 30, 8d supply, fill #0

## 2023-03-07 MED ORDER — PANTOPRAZOLE SODIUM 40 MG PO TBEC
40.0000 mg | DELAYED_RELEASE_TABLET | Freq: Every day | ORAL | 1 refills | Status: DC
  Filled 2023-03-07: qty 30, 30d supply, fill #0

## 2023-03-07 NOTE — Patient Instructions (Addendum)
Someone will reach out regarding your ultrasound in the week or so.  Heat (pad or rice pillow in microwave) over affected area, 10-15 minutes twice daily.   Ice/cold pack over area for 10-15 min twice daily.  OK to take Tylenol 1000 mg (2 extra strength tabs) or 975 mg (3 regular strength tabs) every 6 hours as needed.  Take the Zanaflex/tizanidine 1-2 hours before planned bedtime. If it makes you drowsy, do not take during the day. You can try half a tab the following night.   Let us know if you need anything.  EXERCISES RANGE OF MOTION (ROM) AND STRETCHING EXERCISES  These exercises may help you when beginning to rehabilitate your issue. In order to successfully resolve your symptoms, you must improve your posture. These exercises are designed to help reduce the forward-head and rounded-shoulder posture which contributes to this condition. Your symptoms may resolve with or without further involvement from your physician, physical therapist or athletic trainer. While completing these exercises, remember:  Restoring tissue flexibility helps normal motion to return to the joints. This allows healthier, less painful movement and activity. An effective stretch should be held for at least 20 seconds, although you may need to begin with shorter hold times for comfort. A stretch should never be painful. You should only feel a gentle lengthening or release in the stretched tissue. Do not do any stretch or exercise that you cannot tolerate.  STRETCH- Axial Extensors Lie on your back on the floor. You may bend your knees for comfort. Place a rolled-up hand towel or dish towel, about 2 inches in diameter, under the part of your head that makes contact with the floor. Gently tuck your chin, as if trying to make a "double chin," until you feel a gentle stretch at the base of your head. Hold 15-20 seconds. Repeat 2-3 times. Complete this exercise 1 time per day.   STRETCH - Axial Extension  Stand or  sit on a firm surface. Assume a good posture: chest up, shoulders drawn back, abdominal muscles slightly tense, knees unlocked (if standing) and feet hip width apart. Slowly retract your chin so your head slides back and your chin slightly lowers. Continue to look straight ahead. You should feel a gentle stretch in the back of your head. Be certain not to feel an aggressive stretch since this can cause headaches later. Hold for 15-20 seconds. Repeat 2-3 times. Complete this exercise 1 time per day.  STRETCH - Cervical Side Bend  Stand or sit on a firm surface. Assume a good posture: chest up, shoulders drawn back, abdominal muscles slightly tense, knees unlocked (if standing) and feet hip width apart. Without letting your nose or shoulders move, slowly tip your right / left ear to your shoulder until your feel a gentle stretch in the muscles on the opposite side of your neck. Hold 15-20 seconds. Repeat 2-3 times. Complete this exercise 1-2 times per day.  STRETCH - Cervical Rotators  Stand or sit on a firm surface. Assume a good posture: chest up, shoulders drawn back, abdominal muscles slightly tense, knees unlocked (if standing) and feet hip width apart. Keeping your eyes level with the ground, slowly turn your head until you feel a gentle stretch along the back and opposite side of your neck. Hold 15-20 seconds. Repeat 2-3 times. Complete this exercise 1-2 times per day.  RANGE OF MOTION - Neck Circles  Stand or sit on a firm surface. Assume a good posture: chest up, shoulders drawn back,  abdominal muscles slightly tense, knees unlocked (if standing) and feet hip width apart. Gently roll your head down and around from the back of one shoulder to the back of the other. The motion should never be forced or painful. Repeat the motion 10-20 times, or until you feel the neck muscles relax and loosen. Repeat 2-3 times. Complete the exercise 1-2 times per day. STRENGTHENING EXERCISES - Cervical  Strain and Sprain These exercises may help you when beginning to rehabilitate your injury. They may resolve your symptoms with or without further involvement from your physician, physical therapist, or athletic trainer. While completing these exercises, remember:  Muscles can gain both the endurance and the strength needed for everyday activities through controlled exercises. Complete these exercises as instructed by your physician, physical therapist, or athletic trainer. Progress the resistance and repetitions only as guided. You may experience muscle soreness or fatigue, but the pain or discomfort you are trying to eliminate should never worsen during these exercises. If this pain does worsen, stop and make certain you are following the directions exactly. If the pain is still present after adjustments, discontinue the exercise until you can discuss the trouble with your clinician.  STRENGTH - Cervical Flexors, Isometric Face a wall, standing about 6 inches away. Place a small pillow, a ball about 6-8 inches in diameter, or a folded towel between your forehead and the wall. Slightly tuck your chin and gently push your forehead into the soft object. Push only with mild to moderate intensity, building up tension gradually. Keep your jaw and forehead relaxed. Hold 10 to 20 seconds. Keep your breathing relaxed. Release the tension slowly. Relax your neck muscles completely before you start the next repetition. Repeat 2-3 times. Complete this exercise 1 time per day.  STRENGTH- Cervical Lateral Flexors, Isometric  Stand about 6 inches away from a wall. Place a small pillow, a ball about 6-8 inches in diameter, or a folded towel between the side of your head and the wall. Slightly tuck your chin and gently tilt your head into the soft object. Push only with mild to moderate intensity, building up tension gradually. Keep your jaw and forehead relaxed. Hold 10 to 20 seconds. Keep your breathing  relaxed. Release the tension slowly. Relax your neck muscles completely before you start the next repetition. Repeat 2-3 times. Complete this exercise 1 time per day.  STRENGTH - Cervical Extensors, Isometric  Stand about 6 inches away from a wall. Place a small pillow, a ball about 6-8 inches in diameter, or a folded towel between the back of your head and the wall. Slightly tuck your chin and gently tilt your head back into the soft object. Push only with mild to moderate intensity, building up tension gradually. Keep your jaw and forehead relaxed. Hold 10 to 20 seconds. Keep your breathing relaxed. Release the tension slowly. Relax your neck muscles completely before you start the next repetition. Repeat 2-3 times. Complete this exercise 1 time per day.  POSTURE AND BODY MECHANICS CONSIDERATIONS Keeping correct posture when sitting, standing or completing your activities will reduce the stress put on different body tissues, allowing injured tissues a chance to heal and limiting painful experiences. The following are general guidelines for improved posture. Your physician or physical therapist will provide you with any instructions specific to your needs. While reading these guidelines, remember: The exercises prescribed by your provider will help you have the flexibility and strength to maintain correct postures. The correct posture provides the optimal environment for  your joints to work. All of your joints have less wear and tear when properly supported by a spine with good posture. This means you will experience a healthier, less painful body. Correct posture must be practiced with all of your activities, especially prolonged sitting and standing. Correct posture is as important when doing repetitive low-stress activities (typing) as it is when doing a single heavy-load activity (lifting).  PROLONGED STANDING WHILE SLIGHTLY LEANING FORWARD When completing a task that requires you to lean  forward while standing in one place for a long time, place either foot up on a stationary 2- to 4-inch high object to help maintain the best posture. When both feet are on the ground, the low back tends to lose its slight inward curve. If this curve flattens (or becomes too large), then the back and your other joints will experience too much stress, fatigue more quickly, and can cause pain.   RESTING POSITIONS Consider which positions are most painful for you when choosing a resting position. If you have pain with flexion-based activities (sitting, bending, stooping, squatting), choose a position that allows you to rest in a less flexed posture. You would want to avoid curling into a fetal position on your side. If your pain worsens with extension-based activities (prolonged standing, working overhead), avoid resting in an extended position such as sleeping on your stomach. Most people will find more comfort when they rest with their spine in a more neutral position, neither too rounded nor too arched. Lying on a non-sagging bed on your side with a pillow between your knees, or on your back with a pillow under your knees will often provide some relief. Keep in mind, being in any one position for a prolonged period of time, no matter how correct your posture, can still lead to stiffness.  WALKING Walk with an upright posture. Your ears, shoulders, and hips should all line up. OFFICE WORK When working at a desk, create an environment that supports good, upright posture. Without extra support, muscles fatigue and lead to excessive strain on joints and other tissues.  CHAIR: A chair should be able to slide under your desk when your back makes contact with the back of the chair. This allows you to work closely. The chair's height should allow your eyes to be level with the upper part of your monitor and your hands to be slightly lower than your elbows. Body position: Your feet should make contact with the  floor. If this is not possible, use a foot rest. Keep your ears over your shoulders. This will reduce stress on your neck and low back.
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
## 2023-03-10 ENCOUNTER — Ambulatory Visit (HOSPITAL_BASED_OUTPATIENT_CLINIC_OR_DEPARTMENT_OTHER)
Admission: RE | Admit: 2023-03-10 | Discharge: 2023-03-10 | Disposition: A | Discharge status: Home / Self Care | Payer: Medicare Other | Source: Ambulatory Visit | Attending: Family Medicine | Admitting: Family Medicine

## 2023-03-10 DIAGNOSIS — R9389 Abnormal findings on diagnostic imaging of other specified body structures: Secondary | ICD-10-CM | POA: Insufficient documentation

## 2023-03-30 DIAGNOSIS — L400 Psoriasis vulgaris: Secondary | ICD-10-CM | POA: Diagnosis not present

## 2023-03-30 DIAGNOSIS — L4050 Arthropathic psoriasis, unspecified: Secondary | ICD-10-CM | POA: Diagnosis not present

## 2023-03-30 DIAGNOSIS — Z79899 Other long term (current) drug therapy: Secondary | ICD-10-CM | POA: Diagnosis not present

## 2023-04-02 ENCOUNTER — Other Ambulatory Visit: Payer: Self-pay | Admitting: Family Medicine

## 2023-04-02 DIAGNOSIS — R9389 Abnormal findings on diagnostic imaging of other specified body structures: Secondary | ICD-10-CM

## 2023-04-03 ENCOUNTER — Telehealth: Payer: Self-pay | Admitting: Family Medicine

## 2023-04-03 NOTE — Telephone Encounter (Signed)
Patient called to check her appointments. Reviewed upcoming appts with her. Pt said she was supposed to be referred to GYN. Advised referral was sent to [place]. Pt thought she was being referred down the hall. Pt is okay with either [facility] or [place] -depending on who can see her first.

## 2023-04-04 ENCOUNTER — Other Ambulatory Visit: Payer: Self-pay

## 2023-04-04 ENCOUNTER — Inpatient Hospital Stay (HOSPITAL_BASED_OUTPATIENT_CLINIC_OR_DEPARTMENT_OTHER): Admission: RE | Admit: 2023-04-04 | Payer: Medicare Other | Source: Ambulatory Visit

## 2023-04-04 DIAGNOSIS — Z139 Encounter for screening, unspecified: Secondary | ICD-10-CM

## 2023-04-04 NOTE — Telephone Encounter (Signed)
Pt called back to advise that the gynecology office in [place] cannot see her until after January. She wants to know if Dr. Carmelia Roller can refer her to the gynecology office in our building instead.

## 2023-04-04 NOTE — Telephone Encounter (Signed)
Called pt was advised the referral has been sent.

## 2023-04-06 ENCOUNTER — Ambulatory Visit (HOSPITAL_BASED_OUTPATIENT_CLINIC_OR_DEPARTMENT_OTHER): Payer: Medicare Other

## 2023-04-10 ENCOUNTER — Other Ambulatory Visit (INDEPENDENT_AMBULATORY_CARE_PROVIDER_SITE_OTHER): Payer: Medicare Other

## 2023-04-10 DIAGNOSIS — R7303 Prediabetes: Secondary | ICD-10-CM | POA: Diagnosis not present

## 2023-04-10 DIAGNOSIS — E782 Mixed hyperlipidemia: Secondary | ICD-10-CM

## 2023-04-10 LAB — COMPREHENSIVE METABOLIC PANEL
ALT: 13 U/L (ref 0–35)
AST: 18 U/L (ref 0–37)
Albumin: 4 g/dL (ref 3.5–5.2)
Alkaline Phosphatase: 84 U/L (ref 39–117)
BUN: 15 mg/dL (ref 6–23)
CO2: 32 meq/L (ref 19–32)
Calcium: 9.5 mg/dL (ref 8.4–10.5)
Chloride: 103 meq/L (ref 96–112)
Creatinine, Ser: 0.85 mg/dL (ref 0.40–1.20)
GFR: 68.26 mL/min (ref >60.00)
Glucose, Bld: 108 mg/dL — ABNORMAL HIGH (ref 70–99)
Potassium: 4.1 meq/L (ref 3.5–5.1)
Sodium: 142 meq/L (ref 135–145)
Total Bilirubin: 0.6 mg/dL (ref 0.2–1.2)
Total Protein: 7.3 g/dL (ref 6.0–8.3)

## 2023-04-10 LAB — LIPID PANEL
Cholesterol: 170 mg/dL (ref 0–200)
HDL: 60.9 mg/dL (ref >39.00)
LDL Cholesterol: 86 mg/dL (ref 0–99)
NonHDL: 108.97
Total CHOL/HDL Ratio: 3
Triglycerides: 117 mg/dL (ref 0.0–149.0)
VLDL: 23.4 mg/dL (ref 0.0–40.0)

## 2023-04-10 LAB — HEMOGLOBIN A1C: Hgb A1c MFr Bld: 6.2 % (ref 4.6–6.5)

## 2023-04-11 ENCOUNTER — Ambulatory Visit (HOSPITAL_BASED_OUTPATIENT_CLINIC_OR_DEPARTMENT_OTHER)
Admission: RE | Admit: 2023-04-11 | Discharge: 2023-04-11 | Disposition: A | Discharge status: Home / Self Care | Payer: Medicare Other | Source: Ambulatory Visit | Attending: Family Medicine | Admitting: Family Medicine

## 2023-04-11 ENCOUNTER — Encounter (HOSPITAL_BASED_OUTPATIENT_CLINIC_OR_DEPARTMENT_OTHER): Payer: Self-pay

## 2023-04-11 DIAGNOSIS — Z1231 Encounter for screening mammogram for malignant neoplasm of breast: Secondary | ICD-10-CM | POA: Insufficient documentation

## 2023-04-17 ENCOUNTER — Encounter: Payer: Self-pay | Admitting: Family Medicine

## 2023-04-17 ENCOUNTER — Ambulatory Visit (INDEPENDENT_AMBULATORY_CARE_PROVIDER_SITE_OTHER)
Admission: RE | Admit: 2023-04-17 | Discharge: 2023-04-17 | Disposition: A | Discharge status: Home / Self Care | Payer: Medicare Other | Source: Ambulatory Visit | Attending: Family Medicine | Admitting: Family Medicine

## 2023-04-17 ENCOUNTER — Ambulatory Visit (INDEPENDENT_AMBULATORY_CARE_PROVIDER_SITE_OTHER): Payer: Medicare Other | Admitting: Family Medicine

## 2023-04-17 VITALS — BP 128/74 | HR 74 | Temp 98.0°F | Resp 16 | Ht 61.0 in | Wt 147.2 lb

## 2023-04-17 DIAGNOSIS — R0609 Other forms of dyspnea: Secondary | ICD-10-CM | POA: Diagnosis not present

## 2023-04-17 DIAGNOSIS — Z0001 Encounter for general adult medical examination with abnormal findings: Secondary | ICD-10-CM

## 2023-04-17 DIAGNOSIS — Z0000 Encounter for general adult medical examination without abnormal findings: Secondary | ICD-10-CM

## 2023-04-17 DIAGNOSIS — Z23 Encounter for immunization: Secondary | ICD-10-CM

## 2023-04-17 LAB — CBC
HCT: 36.8 % (ref 36.0–46.0)
Hemoglobin: 11.5 g/dL — ABNORMAL LOW (ref 12.0–15.0)
MCHC: 31.2 g/dL (ref 30.0–36.0)
MCV: 68.5 fL — ABNORMAL LOW (ref 78.0–100.0)
Platelets: 159 10*3/uL (ref 150.0–400.0)
RBC: 5.38 Mil/uL — ABNORMAL HIGH (ref 3.87–5.11)
RDW: 16.3 % — ABNORMAL HIGH (ref 11.5–15.5)
WBC: 5.2 10*3/uL (ref 4.0–10.5)

## 2023-04-17 NOTE — Patient Instructions (Addendum)
Keep the diet clean and stay active.  Give [place] 2-3 business days to get the results of your labs back.   Please get your X-ray done in the basement of our [place] office located on: [address] [place], [place] [address]  You do not need an appointment for that location.   Please get your 2nd Shingrix vaccine at the pharmacy.   Let us know if you need anything.

## 2023-04-17 NOTE — Addendum Note (Signed)
Addended by: Kathi Ludwig on: 04/17/2023 01:58 PM   Modules accepted: Orders

## 2023-04-17 NOTE — Progress Notes (Signed)
Chief Complaint  Patient presents with   Annual Exam    Annual Exam     Well Woman Victoria Hale is here for a complete physical.   Her last physical was >1 year ago.  Current diet: in general, diet is fair. Current exercise: cardio and strength thru exercise classes. Weight is stable and she denies daytime fatigue. Seatbelt? Yes Advanced directive? No  Health Maintenance Colonoscopy- Yes Shingrix- due for 2/2 DEXA- Yes Mammogram- Yes Tetanus- Yes Pneumonia- Yes Hep C screen- Yes  Shortness of breath Over the past 2 weeks, the patient has become short of breath compared to baseline on exertion.  She is not having any wheezing or coughing.  She denies any upper respite symptoms otherwise.  She is not retaining fluid.  There is no pain anywhere.  She has not tried anything thus far.  Past Medical History:  Diagnosis Date   ALLERGIC RHINITIS 03/02/2007   ALOPECIA TOTALIS 03/02/2007   ANA POSITIVE 09/03/2009   ANOMALY, CONGENITAL, KIDNEY NEC 03/02/2007   COLONIC POLYPS, HX OF 03/02/2007   DEGENERATION, LUMBAR/LUMBOSACRAL DISC 03/02/2007   DERMATITIS, ATOPIC 03/02/2007   DYSPNEA 03/01/2007   GERD 03/02/2007   Heart murmur    Hidradenitis 03/05/2010   Hyperlipidemia    HYPERTENSION, BENIGN 11/28/2005   LACTOSE INTOLERANCE 03/02/2007   NEPHROLITHIASIS, HX OF 03/02/2007   OTITIS EXTERNA, CHRONIC NEC 03/02/2007   OVERACTIVE BLADDER 09/03/2009   Overweight(278.02) 03/02/2007   PALPITATIONS, RECURRENT 03/02/2007   PHOTOALLERGIC DERMATITIS 03/02/2007   PSORIASIS 08/28/2008   THALASSEMIA NEC 03/02/2007   Unspecified anemia 10/09/2008     Past Surgical History:  Procedure Laterality Date   CESAREAN SECTION     exploratory abdominal surgury  1970's   s/p BTL     s/p C-section twice      Medications  Current Outpatient Medications on File Prior to Visit  Medication Sig Dispense Refill   albuterol (VENTOLIN HFA) 108 (90 Base) MCG/ACT inhaler Inhale 2 puffs into the  lungs every 6 (six) hours as needed for wheezing or shortness of breath. 8 g 0   amLODipine (NORVASC) 2.5 MG tablet TAKE 1 TABLET(2.5 MG) BY MOUTH DAILY 90 tablet 2   atorvastatin (LIPITOR) 10 MG tablet TAKE 1 TABLET BY MOUTH DAILY 90 tablet 3   COSENTYX SENSOREADY, 300 MG, 150 MG/ML SOAJ Inject into the skin.     lidocaine (LIDODERM) 5 % Place 1 patch onto the skin daily. Remove & Discard patch within 12 hours or as directed by MD 30 patch 0   pantoprazole (PROTONIX) 40 MG tablet Take 1 tablet (40 mg total) by mouth daily. 30 tablet 1   tiZANidine (ZANAFLEX) 4 MG tablet Take 1 tablet (4 mg total) by mouth every 6 (six) hours as needed for muscle spasms. 30 tablet 0    Allergies No Known Allergies  Review of Systems: Constitutional:  no fevers Eye:  no recent significant change in vision Ears:  No changes in hearing Nose/Mouth/Throat:  no complaints of nasal congestion, no sore throat Cardiovascular: no chest pain Respiratory:  +DOE Gastrointestinal:  No change in bowel habits GU:  Female: negative for dysuria Integumentary:  no abnormal skin lesions reported Neurologic:  no headaches Endocrine:  denies unexplained weight changes  Exam BP 128/74 (BP Location: Left Arm, Patient Position: Sitting, Cuff Size: Normal)   Pulse 74   Temp 98 F (36.7 C) (Oral)   Resp 16   Ht 5\' 1"  (1.549 m)   Wt 147 lb 3.2 oz (  66.8 kg)   SpO2 98%   BMI 27.81 kg/m  General:  well developed, well nourished, in no apparent distress Skin:  no significant moles, warts, or growths Head:  no masses, lesions, or tenderness Eyes:  pupils equal and round, sclera anicteric without injection Ears:  canals without lesions, TMs shiny without retraction, no obvious effusion, no erythema Nose:  nares patent, mucosa normal, and no drainage Throat/Pharynx:  lips and gingiva without lesion; tongue and uvula midline; non-inflamed pharynx; no exudates or postnasal drainage Neck: neck supple without adenopathy,  thyromegaly, or masses Lungs:  clear to auscultation, breath sounds equal bilaterally, no respiratory distress Cardio:  regular rate and rhythm, no bruits or LE edema Abdomen:  abdomen soft, nontender; bowel sounds normal; no masses or organomegaly Genital: Deferred Neuro:  gait normal; deep tendon reflexes normal and symmetric Psych: well oriented with normal range of affect and appropriate judgment/insight  Assessment and Plan  Well adult exam  DOE (dyspnea on exertion) - Plan: CBC, DG Chest 2 View   Well 72 y.o. female. Counseled on diet and exercise. Shingrix 2/2 recommended. Advanced directive form provided today.  DOE: Will check an x-ray, CBC.  If unremarkable, will check an echo. Flu shot today. Other orders as above. Follow up in 6 mo. The patient voiced understanding and agreement to the plan.  Jilda Roche [place], DO 04/17/23 1:52 PM

## 2023-04-18 ENCOUNTER — Other Ambulatory Visit: Payer: Self-pay

## 2023-04-18 ENCOUNTER — Telehealth: Payer: Self-pay | Admitting: *Deleted

## 2023-04-18 ENCOUNTER — Other Ambulatory Visit (INDEPENDENT_AMBULATORY_CARE_PROVIDER_SITE_OTHER): Payer: Medicare Other

## 2023-04-18 ENCOUNTER — Ambulatory Visit (INDEPENDENT_AMBULATORY_CARE_PROVIDER_SITE_OTHER): Payer: Medicare Other

## 2023-04-18 DIAGNOSIS — D509 Iron deficiency anemia, unspecified: Secondary | ICD-10-CM | POA: Diagnosis not present

## 2023-04-18 LAB — HEMOGLOBIN A1C: Hgb A1c MFr Bld: 6.2 % (ref 4.6–6.5)

## 2023-04-18 NOTE — Telephone Encounter (Signed)
Labs that were faxed for add on are unable to be added.   Only purple top was drawn.  Pt will have to be drawn again.

## 2023-04-18 NOTE — Telephone Encounter (Signed)
Called pt lab appt scheduled for today 215 for labs.

## 2023-04-19 LAB — IBC PANEL
Iron: 66 ug/dL (ref 42–145)
Saturation Ratios: 21.6 % (ref 20.0–50.0)
TIBC: 305.2 ug/dL (ref 250.0–450.0)
Transferrin: 218 mg/dL (ref 212.0–360.0)

## 2023-04-19 LAB — VITAMIN B12: Vitamin B-12: 934 pg/mL — ABNORMAL HIGH (ref 211–911)

## 2023-04-22 LAB — LACTATE DEHYDROGENASE, ISOENZYMES
LD 1: 22 % (ref 18–32)
LD 2: 33 % (ref 29–42)
LD 3: 25 % (ref 14–30)
LD 4: 9 % (ref 6–13)
LD 5: 10 % (ref 5–18)
LDH: 174 U/L (ref 120–250)

## 2023-05-01 ENCOUNTER — Encounter: Payer: Self-pay | Admitting: Obstetrics and Gynecology

## 2023-05-01 ENCOUNTER — Other Ambulatory Visit (HOSPITAL_COMMUNITY)
Admission: RE | Admit: 2023-05-01 | Discharge: 2023-05-01 | Disposition: A | Discharge status: Home / Self Care | Payer: Medicare Other | Source: Ambulatory Visit | Attending: Obstetrics and Gynecology | Admitting: Obstetrics and Gynecology

## 2023-05-01 ENCOUNTER — Ambulatory Visit: Payer: Medicare Other | Admitting: Obstetrics and Gynecology

## 2023-05-01 VITALS — BP 168/56 | HR 57 | Wt 149.0 lb

## 2023-05-01 DIAGNOSIS — R9389 Abnormal findings on diagnostic imaging of other specified body structures: Secondary | ICD-10-CM | POA: Diagnosis not present

## 2023-05-01 NOTE — Progress Notes (Signed)
NEW GYNECOLOGY PATIENT Patient name: Victoria Hale MRN [id number]  Date of birth: 02/22/1951 Chief Complaint:   No chief complaint on file.     History:  Victoria Hale is a 72 y.o. G2P2002 being seen today for thickened EMS on [place] without bleeding.    Discussed the use of AI scribe software for clinical note transcription with the patient, who gave verbal consent to proceed.  History of Present Illness   The patient, aged 72, was referred for a biopsy following an incidental finding of uterine thickening during an emergency room visit for an unrelated accident. They have not experienced any vaginal bleeding or pelvic pain. Their last menstrual period was many years ago, indicating post-menopausal status. The thickening of the uterus was discovered during a routine examination at the emergency room, and the primary care physician subsequently referred the patient for further investigation following thickened endometrium on pelvic ultrasound.           Gynecologic History No LMP recorded. Patient is postmenopausal. Contraception: post menopausal status Last Pap: 2016 Last Mammogram:  03/2023 BIRADS 1 Last Colonoscopy:  2023  Obstetric History OB History  Gravida Para Term Preterm AB Living  2 2 2     2   SAB IAB Ectopic Multiple Live Births          2    # Outcome Date GA Lbr Len/2nd Weight Sex Type Anes PTL Lv  2 Term 54 [redacted]w[redacted]d   M CS-Classical EPI N LIV  1 Term 1982 [redacted]w[redacted]d   M CS-Classical EPI N LIV    Past Medical History:  Diagnosis Date   ALLERGIC RHINITIS 03/02/2007   ALOPECIA TOTALIS 03/02/2007   ANA POSITIVE 09/03/2009   ANOMALY, CONGENITAL, KIDNEY NEC 03/02/2007   COLONIC POLYPS, HX OF 03/02/2007   DEGENERATION, LUMBAR/LUMBOSACRAL DISC 03/02/2007   DERMATITIS, ATOPIC 03/02/2007   DYSPNEA 03/01/2007   GERD 03/02/2007   Heart murmur    Hidradenitis 03/05/2010   Hyperlipidemia    HYPERTENSION, BENIGN 11/28/2005   LACTOSE INTOLERANCE 03/02/2007    NEPHROLITHIASIS, HX OF 03/02/2007   OTITIS EXTERNA, CHRONIC NEC 03/02/2007   OVERACTIVE BLADDER 09/03/2009   Overweight(278.02) 03/02/2007   PALPITATIONS, RECURRENT 03/02/2007   PHOTOALLERGIC DERMATITIS 03/02/2007   PSORIASIS 08/28/2008   THALASSEMIA NEC 03/02/2007   Unspecified anemia 10/09/2008    Past Surgical History:  Procedure Laterality Date   CESAREAN SECTION     exploratory abdominal surgury  1970's   s/p BTL     s/p C-section twice      Current Outpatient Medications on File Prior to Visit  Medication Sig Dispense Refill   amLODipine (NORVASC) 2.5 MG tablet TAKE 1 TABLET(2.5 MG) BY MOUTH DAILY 90 tablet 2   atorvastatin (LIPITOR) 10 MG tablet TAKE 1 TABLET BY MOUTH DAILY 90 tablet 3   cholecalciferol (VITAMIN D3) 25 MCG (1000 UNIT) tablet Take 1,000 Units by mouth daily.     COSENTYX SENSOREADY, 300 MG, 150 MG/ML SOAJ Inject into the skin.     albuterol (VENTOLIN HFA) 108 (90 Base) MCG/ACT inhaler Inhale 2 puffs into the lungs every 6 (six) hours as needed for wheezing or shortness of breath. (Patient not taking: Reported on 05/01/2023) 8 g 0   lidocaine (LIDODERM) 5 % Place 1 patch onto the skin daily. Remove & Discard patch within 12 hours or as directed by MD (Patient not taking: Reported on 05/01/2023) 30 patch 0   pantoprazole (PROTONIX) 40 MG tablet Take 1 tablet (40 mg total) by  mouth daily. (Patient not taking: Reported on 05/01/2023) 30 tablet 1   tiZANidine (ZANAFLEX) 4 MG tablet Take 1 tablet (4 mg total) by mouth every 6 (six) hours as needed for muscle spasms. (Patient not taking: Reported on 05/01/2023) 30 tablet 0   No current facility-administered medications on file prior to visit.    No Known Allergies  Social History:  reports that she quit smoking about 36 years ago. Her smoking use included cigarettes. She started smoking about 51 years ago. She has a 3.8 pack-year smoking history. She has never used smokeless tobacco. She reports current alcohol use of  about 1.0 standard drink of alcohol per week. She reports that she does not use drugs.  Family History  Problem Relation Age of Onset   Breast cancer Mother 41       diagnosed   Heart attack Father 27   Heart disease Sister 69       had heart valve replaced   Colon cancer Neg Hx    Colon polyps Neg Hx    Esophageal cancer Neg Hx    Stomach cancer Neg Hx    Rectal cancer Neg Hx     The following portions of the patient's history were reviewed and updated as appropriate: allergies, current medications, past family history, past medical history, past social history, past surgical history and problem list.  Review of Systems Pertinent items noted in HPI and remainder of comprehensive ROS otherwise negative.  Physical Exam:  BP (!) 168/56   Pulse (!) 57   Wt 149 lb (67.6 kg)   BMI 28.15 kg/m  Physical Exam Vitals and nursing note reviewed. Exam conducted with a chaperone present.  Constitutional:      Appearance: Normal appearance.  Pulmonary:     Effort: Pulmonary effort is normal.  Abdominal:     Palpations: Abdomen is soft.  Genitourinary:    General: Normal vulva.     Exam position: Lithotomy position.  Neurological:     Mental Status: She is alert.    Endometrial Biopsy Procedure  Patient identified, informed consent performed,  indication reviewed, consent signed.  Reviewed risk of perforation, pain, bleeding, insufficient sample, etc were reviewd. Time out was performed.  Urine pregnancy test negative.  Speculum placed in the vagina.  Cervix visualized.  Cleaned with Betadine x 2.  Anterior cervix grasped anteriorly with a single tooth tenaculum.  Paracervical block was not administered.  The cervix was gently dilated.  The pipelle was passed twice without difficulty and sample obtained. Tenaculum was removed, good hemostasis noted.  Patient tolerated procedure well.  Patient was given post-procedure instructions.     Assessment and Plan:   1. Increased  endometrial stripe thickness Now s/p uncomplicated EMB.     Endometrial Thickening Detected incidentally during an unrelated ER visit. No vaginal bleeding or pelvic pain reported. The thickening could be due to a polyp or potentially a more serious condition such as endometrial cancer. -Performed endometrial biopsy to rule out malignancy. -Results expected within a few days.     - Surgical pathology    Routine preventative health maintenance measures emphasized. Please refer to After Visit Summary for other counseling recommendations.   Follow-up: No follow-ups on file.      Lorriane Shire, MD Obstetrician & Gynecologist, [facility] [employer], [facility]

## 2023-05-03 ENCOUNTER — Other Ambulatory Visit (HOSPITAL_BASED_OUTPATIENT_CLINIC_OR_DEPARTMENT_OTHER): Payer: Self-pay

## 2023-05-03 ENCOUNTER — Telehealth: Payer: Self-pay | Admitting: Family Medicine

## 2023-05-03 ENCOUNTER — Other Ambulatory Visit: Payer: Self-pay

## 2023-05-03 LAB — SURGICAL PATHOLOGY

## 2023-05-03 MED ORDER — AMLODIPINE BESYLATE 2.5 MG PO TABS
2.5000 mg | ORAL_TABLET | Freq: Every day | ORAL | 2 refills | Status: DC
  Filled 2023-05-03: qty 90, 90d supply, fill #0

## 2023-05-03 NOTE — Telephone Encounter (Signed)
Refill sent.

## 2023-05-03 NOTE — Telephone Encounter (Signed)
Patient called and needs a med refill on amLODipine (NORVASC) 2.5 MG tablet

## 2023-05-04 ENCOUNTER — Other Ambulatory Visit (HOSPITAL_BASED_OUTPATIENT_CLINIC_OR_DEPARTMENT_OTHER): Payer: Self-pay

## 2023-05-10 DIAGNOSIS — H2513 Age-related nuclear cataract, bilateral: Secondary | ICD-10-CM | POA: Diagnosis not present

## 2023-07-18 ENCOUNTER — Ambulatory Visit (INDEPENDENT_AMBULATORY_CARE_PROVIDER_SITE_OTHER): Payer: Medicare Other | Admitting: *Deleted

## 2023-07-18 VITALS — BP 146/69 | HR 66 | Ht 61.0 in | Wt 155.2 lb

## 2023-07-18 DIAGNOSIS — Z139 Encounter for screening, unspecified: Secondary | ICD-10-CM

## 2023-07-18 DIAGNOSIS — Z0000 Encounter for general adult medical examination without abnormal findings: Secondary | ICD-10-CM | POA: Diagnosis not present

## 2023-07-18 NOTE — Progress Notes (Signed)
 Subjective:   Victoria Hale is a 73 y.o. female who presents for Medicare Annual (Subsequent) preventive examination.  Visit Complete: In person   Cardiac Risk Factors include: dyslipidemia;hypertension;advanced age (>29men, >75 women)     Objective:    Today's Vitals   07/18/23 1044 07/18/23 1106  BP: (!) 153/53 (!) 146/69  Pulse: 66 66  Weight: 155 lb 3.2 oz (70.4 kg)   Height: 5\' 1"  (1.549 m)    Body mass index is 29.32 kg/m.     07/18/2023   10:49 AM 08/03/2022   10:20 AM 06/30/2022    1:07 PM 06/11/2021   11:45 AM  Advanced Directives  Does Patient Have a Medical Advance Directive? No No No No  Would patient like information on creating a medical advance directive? No - Patient declined No - Patient declined No - Patient declined Yes (MAU/Ambulatory/Procedural Areas - Information given)    Current Medications (verified) Outpatient Encounter Medications as of 07/18/2023  Medication Sig   amLODipine (NORVASC) 2.5 MG tablet Take 1 tablet (2.5 mg total) by mouth daily.   atorvastatin (LIPITOR) 10 MG tablet TAKE 1 TABLET BY MOUTH DAILY   COSENTYX SENSOREADY, 300 MG, 150 MG/ML SOAJ Inject into the skin.   Multiple Vitamin (MULTIVITAMIN) tablet Take 0.5 tablets by mouth daily.   [DISCONTINUED] albuterol (VENTOLIN HFA) 108 (90 Base) MCG/ACT inhaler Inhale 2 puffs into the lungs every 6 (six) hours as needed for wheezing or shortness of breath. (Patient not taking: Reported on 05/01/2023)   [DISCONTINUED] cholecalciferol (VITAMIN D3) 25 MCG (1000 UNIT) tablet Take 1,000 Units by mouth daily.   [DISCONTINUED] lidocaine (LIDODERM) 5 % Place 1 patch onto the skin daily. Remove & Discard patch within 12 hours or as directed by MD (Patient not taking: Reported on 05/01/2023)   [DISCONTINUED] pantoprazole (PROTONIX) 40 MG tablet Take 1 tablet (40 mg total) by mouth daily. (Patient not taking: Reported on 05/01/2023)   [DISCONTINUED] tiZANidine (ZANAFLEX) 4 MG tablet Take 1 tablet (4 mg  total) by mouth every 6 (six) hours as needed for muscle spasms. (Patient not taking: Reported on 05/01/2023)   No facility-administered encounter medications on file as of 07/18/2023.    Allergies (verified) Patient has no known allergies.   History: Past Medical History:  Diagnosis Date   ALLERGIC RHINITIS 03/02/2007   ALOPECIA TOTALIS 03/02/2007   ANA POSITIVE 09/03/2009   ANOMALY, CONGENITAL, KIDNEY NEC 03/02/2007   COLONIC POLYPS, HX OF 03/02/2007   DEGENERATION, LUMBAR/LUMBOSACRAL DISC 03/02/2007   DERMATITIS, ATOPIC 03/02/2007   DYSPNEA 03/01/2007   GERD 03/02/2007   Heart murmur    Hidradenitis 03/05/2010   Hyperlipidemia    HYPERTENSION, BENIGN 11/28/2005   LACTOSE INTOLERANCE 03/02/2007   NEPHROLITHIASIS, HX OF 03/02/2007   OTITIS EXTERNA, CHRONIC NEC 03/02/2007   OVERACTIVE BLADDER 09/03/2009   Overweight(278.02) 03/02/2007   PALPITATIONS, RECURRENT 03/02/2007   PHOTOALLERGIC DERMATITIS 03/02/2007   PSORIASIS 08/28/2008   THALASSEMIA NEC 03/02/2007   Unspecified anemia 10/09/2008   Past Surgical History:  Procedure Laterality Date   CESAREAN SECTION     exploratory abdominal surgury  1970's   s/p BTL     s/p C-section twice     Family History  Problem Relation Age of Onset   Breast cancer Mother 19       diagnosed   Heart attack Father 25   Heart disease Sister 27       had heart valve replaced   Colon cancer Neg Hx    Colon  polyps Neg Hx    Esophageal cancer Neg Hx    Stomach cancer Neg Hx    Rectal cancer Neg Hx    Social History   Socioeconomic History   Marital status: Divorced    Spouse name: Not on file   Number of children: 2   Years of education: Not on file   Highest education level: Not on file  Occupational History   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  Tobacco Use   Smoking status: Former    Current packs/day: 0.00    Average packs/day: 0.3 packs/day for 15.0 years (3.8 ttl pk-yrs)    Types: Cigarettes    Start date:  07/28/1971    Quit date: 07/27/1986    Years since quitting: 37.0   Smokeless tobacco: Never   Tobacco comments:    Stopped in 1988 and started back in 2015  Vaping Use   Vaping status: Never Used  Substance and Sexual Activity   Alcohol use: Yes    Alcohol/week: 1.0 standard drink of alcohol    Types: 1 Glasses of wine per week    Comment: glass of wine weekend   Drug use: No   Sexual activity: Not Currently    Comment: 1st intercourse- 18, partners- less than 5  Other Topics Concern   Not on file  Social History Narrative   Not on file   Social Drivers of Health   Financial Resource Strain: Medium Risk (07/18/2023)   Overall Financial Resource Strain (CARDIA)    Difficulty of Paying Living Expenses: Somewhat hard  Food Insecurity: Food Insecurity Present (07/18/2023)   Hunger Vital Sign    Worried About Running Out of Food in the Last Year: Sometimes true    Ran Out of Food in the Last Year: Never true  Transportation Needs: No Transportation Needs (07/18/2023)   PRAPARE - Administrator, Civil Service (Medical): No    Lack of Transportation (Non-Medical): No  Physical Activity: Inactive (07/18/2023)   Exercise Vital Sign    Days of Exercise per Week: 0 days    Minutes of Exercise per Session: 0 min  Stress: Stress Concern Present (07/18/2023)   [employer] of Occupational Health - Occupational Stress Questionnaire    Feeling of Stress : To some extent  Social Connections: Socially Isolated (07/18/2023)   Social Connection and Isolation Panel [NHANES]    Frequency of Communication with Friends and Family: More than three times a week    Frequency of Social Gatherings with Friends and Family: Three times a week    Attends Religious Services: Never    Active Member of Clubs or Organizations: No    Attends Banker Meetings: Never    Marital Status: Divorced    Tobacco Counseling Counseling given: Not Answered Tobacco comments: Stopped in 1988  and started back in 2015   Clinical Intake:  Pre-visit preparation completed: Yes  Pain : 0-10 Pain Type: Acute pain Pain Location: Leg Pain Orientation: Right Pain Descriptors / Indicators: Pins and needles Pain Onset: 1 to 4 weeks ago Pain Frequency: Intermittent  BMI - recorded: 29.32 Nutritional Status: BMI 25 -29 Overweight Nutritional Risks: None Diabetes: No  How often do you need to have someone help you when you read instructions, pamphlets, or other written materials from your doctor or pharmacy?: 1 - Never  Interpreter Needed?: No  Information entered by :: Donne Anon, CMA   Activities of Daily Living    07/18/2023   10:50 AM  In your present state of health, do you have any difficulty performing the following activities:  Hearing? 0  Vision? 0  Difficulty concentrating or making decisions? 0  Walking or climbing stairs? 0  Dressing or bathing? 0  Doing errands, shopping? 0  Preparing Food and eating ? N  Using the Toilet? N  In the past six months, have you accidently leaked urine? Y  Do you have problems with loss of bowel control? N  Managing your Medications? N  Managing your Finances? N  Housekeeping or managing your Housekeeping? N    Patient Care Team: Sharlene Dory, DO as PCP - General (Family Medicine)  Indicate any recent Medical Services you may have received from other than [facility] providers in the past year (date may be approximate).     Assessment:   This is a routine wellness examination for [place].  Hearing/Vision screen No results found.   Goals Addressed   None    Depression Screen    07/18/2023   11:00 AM 04/17/2023    1:26 PM 06/30/2022    1:12 PM 04/15/2022   10:02 AM 03/29/2022    2:01 PM 06/11/2021   11:49 AM 10/05/2020   12:34 PM  PHQ 2/9 Scores  PHQ - 2 Score 0 0 0 0 1 0 0  PHQ- 9 Score    1       Fall Risk    07/18/2023   10:54 AM 04/17/2023    1:26 PM 06/30/2022    1:08 PM 04/15/2022   10:02 AM  03/29/2022    2:01 PM  Fall Risk   Falls in the past year? 0 0 0 0 0  Number falls in past yr: 0 0 0 0 0  Injury with Fall? 0 0 0 0 0  Risk for fall due to : No Fall Risks  No Fall Risks No Fall Risks No Fall Risks  Follow up Falls evaluation completed Falls evaluation completed Falls evaluation completed Falls evaluation completed Falls evaluation completed    MEDICARE RISK AT HOME: Medicare Risk at Home Any stairs in or around the home?: Yes (2 steps onto the porch) If so, are there any without handrails?: No Home free of loose throw rugs in walkways, pet beds, electrical cords, etc?: Yes Adequate lighting in your home to reduce risk of falls?: Yes Life alert?: No Use of a cane, walker or w/c?: No Grab bars in the bathroom?: Yes Shower chair or bench in shower?: Yes (built in seat) Elevated toilet seat or a handicapped toilet?: No  TIMED UP AND GO:  Was the test performed?  Yes  Length of time to ambulate 10 feet: 6 sec Gait steady and fast without use of assistive device    Cognitive Function:        07/18/2023   11:02 AM 06/30/2022    1:18 PM  6CIT Screen  What Year? 0 points 0 points  What month? 0 points 3 points  What time? 0 points 0 points  Count back from 20 0 points 0 points  Months in reverse 0 points 0 points  Repeat phrase 0 points 2 points  Total Score 0 points 5 points    Immunizations Immunization History  Administered Date(s) Administered   Fluad Quad(high Dose 65+) 04/13/2021, 03/29/2022   Fluad Trivalent(High Dose 65+) 04/17/2023   Influenza, High Dose Seasonal PF 04/19/2016   PFIZER(Purple Top)SARS-COV-2 Vaccination 07/15/2019, 08/06/2019   PNEUMOCOCCAL CONJUGATE-20 04/13/2021   Pneumococcal Conjugate-13 11/01/2017   Td  08/29/2006   Tdap 11/01/2017   Zoster Recombinant(Shingrix) 12/10/2021    TDAP status: Up to date  Flu Vaccine status: Up to date  Pneumococcal vaccine status: Up to date  Covid-19 vaccine status: Information provided  on how to obtain vaccines.   Qualifies for Shingles Vaccine? Yes   Zostavax completed No   Shingrix Completed?: No.    Education has been provided regarding the importance of this vaccine. Patient has been advised to call insurance company to determine out of pocket expense if they have not yet received this vaccine. Advised may also receive vaccine at local pharmacy or Health Dept. Verbalized acceptance and understanding.  Screening Tests Health Maintenance  Topic Date Due   Zoster Vaccines- Shingrix (2 of 2) 02/04/2022   Medicare Annual Wellness (AWV)  07/01/2023   COVID-19 Vaccine (3 - Pfizer risk series) 10/15/2023 (Originally 09/03/2019)   MAMMOGRAM  04/10/2025   DTaP/Tdap/Td (3 - Td or Tdap) 11/02/2027   Colonoscopy  11/25/2031   Pneumonia Vaccine 28+ Years old  Completed   INFLUENZA VACCINE  Completed   DEXA SCAN  Completed   Hepatitis C Screening  Completed   HPV VACCINES  Aged Out    Health Maintenance  Health Maintenance Due  Topic Date Due   Zoster Vaccines- Shingrix (2 of 2) 02/04/2022   Medicare Annual Wellness (AWV)  07/01/2023    Colorectal cancer screening: Type of screening: Colonoscopy. Completed 11/24/21. Repeat every 10 years  Mammogram status: Completed 04/11/23. Repeat every year  Bone Density status: Completed 06/28/21. Results reflect: Bone density results: NORMAL. Repeat every 2 years.  Lung Cancer Screening: (Low Dose CT Chest recommended if Age 22-80 years, 20 pack-year currently smoking OR have quit w/in 15years.) does not qualify.   Additional Screening:  Hepatitis C Screening: does qualify; Completed 10/02/15  Vision Screening: Recommended annual ophthalmology exams for early detection of glaucoma and other disorders of the eye. Is the patient up to date with their annual eye exam?  Yes  Who is the provider or what is the name of the office in which the patient attends annual eye exams? [facility] If pt is not established with a provider, would  they like to be referred to a provider to establish care? No .   Dental Screening: Recommended annual dental exams for proper oral hygiene  Diabetic Foot Exam: N/a  Community Resource Referral / Chronic Care Management: CRR required this visit?  Yes   CCM required this visit?  No     Plan:     I have personally reviewed and noted the following in the patient's chart:   Medical and social history Use of alcohol, tobacco or illicit drugs  Current medications and supplements including opioid prescriptions. Patient is not currently taking opioid prescriptions. Functional ability and status Nutritional status Physical activity Advanced directives List of other physicians Hospitalizations, surgeries, and ER visits in previous 12 months Vitals Screenings to include cognitive, depression, and falls Referrals and appointments  In addition, I have reviewed and discussed with patient certain preventive protocols, quality metrics, and best practice recommendations. A written personalized care plan for preventive services as well as general preventive health recommendations were provided to patient.     Donne Anon, CMA   07/18/2023   After Visit Summary: (In Person-Declined) Patient declined AVS at this time.  Nurse Notes: None

## 2023-07-18 NOTE — Patient Instructions (Signed)
 [name] , Thank you for taking time to come for your Medicare Wellness Visit. I appreciate your ongoing commitment to your health goals. Please review the following plan we discussed and let me know if I can assist you in the future.     This is a list of the screening recommended for you and due dates:  Health Maintenance  Topic Date Due   Zoster (Shingles) Vaccine (2 of 2) 02/04/2022   COVID-19 Vaccine (3 - Pfizer risk series) 10/15/2023*   Medicare Annual Wellness Visit  07/17/2024   Mammogram  04/10/2025   DTaP/Tdap/Td vaccine (3 - Td or Tdap) 11/02/2027   Colon Cancer Screening  11/25/2031   Pneumonia Vaccine  Completed   Flu Shot  Completed   DEXA scan (bone density measurement)  Completed   Hepatitis C Screening  Completed   HPV Vaccine  Aged Out  *Topic was postponed. The date shown is not the original due date.    Next appointment: Follow up in one year for your annual wellness visit.   Preventive Care 74 Years and Older, Female Preventive care refers to lifestyle choices and visits with your health care provider that can promote health and wellness. What does preventive care include? A yearly physical exam. This is also called an annual well check. Dental exams once or twice a year. Routine eye exams. Ask your health care provider how often you should have your eyes checked. Personal lifestyle choices, including: Daily care of your teeth and gums. Regular physical activity. Eating a healthy diet. Avoiding tobacco and drug use. Limiting alcohol use. Practicing safe sex. Taking low-dose aspirin every day. Taking vitamin and mineral supplements as recommended by your health care provider. What happens during an annual well check? The services and screenings done by your health care provider during your annual well check will depend on your age, overall health, lifestyle risk factors, and family history of disease. Counseling  Your health care provider may ask you  questions about your: Alcohol use. Tobacco use. Drug use. Emotional well-being. Home and relationship well-being. Sexual activity. Eating habits. History of falls. Memory and ability to understand (cognition). Work and work Astronomer. Reproductive health. Screening  You may have the following tests or measurements: Height, weight, and BMI. Blood pressure. Lipid and cholesterol levels. These may be checked every 5 years, or more frequently if you are over 46 years old. Skin check. Lung cancer screening. You may have this screening every year starting at age 50 if you have a 30-pack-year history of smoking and currently smoke or have quit within the past 15 years. Fecal occult blood test (FOBT) of the stool. You may have this test every year starting at age 81. Flexible sigmoidoscopy or colonoscopy. You may have a sigmoidoscopy every 5 years or a colonoscopy every 10 years starting at age 44. Hepatitis C blood test. Hepatitis B blood test. Sexually transmitted disease (STD) testing. Diabetes screening. This is done by checking your blood sugar (glucose) after you have not eaten for a while (fasting). You may have this done every 1-3 years. Bone density scan. This is done to screen for osteoporosis. You may have this done starting at age 23. Mammogram. This may be done every 1-2 years. Talk to your health care provider about how often you should have regular mammograms. Talk with your health care provider about your test results, treatment options, and if necessary, the need for more tests. Vaccines  Your health care provider may recommend certain vaccines, such  as: Influenza vaccine. This is recommended every year. Tetanus, diphtheria, and acellular pertussis (Tdap, Td) vaccine. You may need a Td booster every 10 years. Zoster vaccine. You may need this after age 68. Pneumococcal 13-valent conjugate (PCV13) vaccine. One dose is recommended after age 45. Pneumococcal polysaccharide  (PPSV23) vaccine. One dose is recommended after age 59. Talk to your health care provider about which screenings and vaccines you need and how often you need them. This information is not intended to replace advice given to you by your health care provider. Make sure you discuss any questions you have with your health care provider. Document Released: 06/12/2015 Document Revised: 02/03/2016 Document Reviewed: 03/17/2015 Elsevier Interactive Patient Education  2017 [employer].  Fall Prevention in the Home Falls can cause injuries. They can happen to people of all ages. There are many things you can do to make your home safe and to help prevent falls. What can I do on the outside of my home? Regularly fix the edges of walkways and driveways and fix any cracks. Remove anything that might make you trip as you walk through a door, such as a raised step or threshold. Trim any bushes or trees on the path to your home. Use bright outdoor lighting. Clear any walking paths of anything that might make someone trip, such as rocks or tools. Regularly check to see if handrails are loose or broken. Make sure that both sides of any steps have handrails. Any raised decks and porches should have guardrails on the edges. Have any leaves, snow, or ice cleared regularly. Use sand or salt on walking paths during winter. Clean up any spills in your garage right away. This includes oil or grease spills. What can I do in the bathroom? Use night lights. Install grab bars by the toilet and in the tub and shower. Do not use towel bars as grab bars. Use non-skid mats or decals in the tub or shower. If you need to sit down in the shower, use a plastic, non-slip stool. Keep the floor dry. Clean up any water that spills on the floor as soon as it happens. Remove soap buildup in the tub or shower regularly. Attach bath mats securely with double-sided non-slip rug tape. Do not have throw rugs and other things on the  floor that can make you trip. What can I do in the bedroom? Use night lights. Make sure that you have a light by your bed that is easy to reach. Do not use any sheets or blankets that are too big for your bed. They should not hang down onto the floor. Have a firm chair that has side arms. You can use this for support while you get dressed. Do not have throw rugs and other things on the floor that can make you trip. What can I do in the kitchen? Clean up any spills right away. Avoid walking on wet floors. Keep items that you use a lot in easy-to-reach places. If you need to reach something above you, use a strong step stool that has a grab bar. Keep electrical cords out of the way. Do not use floor polish or wax that makes floors slippery. If you must use wax, use non-skid floor wax. Do not have throw rugs and other things on the floor that can make you trip. What can I do with my stairs? Do not leave any items on the stairs. Make sure that there are handrails on both sides of the stairs and use  them. Fix handrails that are broken or loose. Make sure that handrails are as long as the stairways. Check any carpeting to make sure that it is firmly attached to the stairs. Fix any carpet that is loose or worn. Avoid having throw rugs at the top or bottom of the stairs. If you do have throw rugs, attach them to the floor with carpet tape. Make sure that you have a light switch at the top of the stairs and the bottom of the stairs. If you do not have them, ask someone to add them for you. What else can I do to help prevent falls? Wear shoes that: Do not have high heels. Have rubber bottoms. Are comfortable and fit you well. Are closed at the toe. Do not wear sandals. If you use a stepladder: Make sure that it is fully opened. Do not climb a closed stepladder. Make sure that both sides of the stepladder are locked into place. Ask someone to hold it for you, if possible. Clearly mark and make  sure that you can see: Any grab bars or handrails. First and last steps. Where the edge of each step is. Use tools that help you move around (mobility aids) if they are needed. These include: Canes. Walkers. Scooters. Crutches. Turn on the lights when you go into a dark area. Replace any light bulbs as soon as they burn out. Set up your furniture so you have a clear path. Avoid moving your furniture around. If any of your floors are uneven, fix them. If there are any pets around you, be aware of where they are. Review your medicines with your doctor. Some medicines can make you feel dizzy. This can increase your chance of falling. Ask your doctor what other things that you can do to help prevent falls. This information is not intended to replace advice given to you by your health care provider. Make sure you discuss any questions you have with your health care provider. Document Released: 03/12/2009 Document Revised: 10/22/2015 Document Reviewed: 06/20/2014 Elsevier Interactive Patient Education  2017 [employer].

## 2023-07-20 ENCOUNTER — Telehealth: Payer: Self-pay | Admitting: *Deleted

## 2023-07-20 NOTE — Progress Notes (Signed)
 Complex Care Management Note Care Guide Note  07/20/2023 Name: MAANASA ADERHOLD MRN: [id number] DOB: Oct 12, 1950   Complex Care Management Outreach Attempts: An unsuccessful telephone outreach was attempted today to offer the patient information about available complex care management services.  Follow Up Plan:  Additional outreach attempts will be made to offer the patient complex care management information and services.   Encounter Outcome:  No Answer  [facility] HealthPopulation Health Care Guide  Direct [phone] Fax:[phone] Website: [email or web address]

## 2023-07-24 ENCOUNTER — Telehealth: Payer: Self-pay | Admitting: *Deleted

## 2023-07-24 NOTE — Progress Notes (Signed)
 Complex Care Management Note Care Guide Note  07/24/2023 Name: Victoria Hale MRN: [id number] DOB: 12-Nov-1950  Victoria Hale is a 73 y.o. year old female who is a primary care patient of Sharlene Dory, DO . The community resource team was consulted for assistance with Financial Difficulties related to gas bill  SDOH screenings and interventions completed:  Yes     SDOH Interventions Today    Flowsheet Row Most Recent Value  SDOH Interventions   Financial Strain Interventions Community Resources Provided  [Patient provided resources for overdue gas bill as she said that was her only issue at this time]        Care guide performed the following interventions: Patient provided with information about care guide support team and interviewed to confirm resource needs. Patient provided resources for overdue gas bill as she said that was her only issue at this time  Follow Up Plan:  No further follow up planned at this time. The patient has been provided with needed resources.  Encounter Outcome:  Patient Visit Completed  Dione Booze  [facility] HealthPopulation Health Care Guide  Direct Dial:[phone] [phone] Website: [email or web address]

## 2023-07-28 ENCOUNTER — Other Ambulatory Visit: Payer: Self-pay | Admitting: Family Medicine

## 2023-08-01 ENCOUNTER — Other Ambulatory Visit: Payer: Self-pay | Admitting: Family Medicine

## 2023-08-01 ENCOUNTER — Other Ambulatory Visit (HOSPITAL_BASED_OUTPATIENT_CLINIC_OR_DEPARTMENT_OTHER): Payer: Self-pay

## 2023-08-01 MED ORDER — AMLODIPINE BESYLATE 2.5 MG PO TABS
2.5000 mg | ORAL_TABLET | Freq: Every day | ORAL | 1 refills | Status: DC
  Filled 2023-08-01: qty 90, 90d supply, fill #0

## 2023-08-01 NOTE — Telephone Encounter (Signed)
 Copied from CRM [id number]. Topic: Clinical - Medication Refill >> Aug 01, 2023  9:49 AM Drema Balzarine wrote: Most Recent Primary Care Visit:  Provider: Juel Burrow  Department: [facility]  Visit Type: MEDICARE AWV, SEQUENTIAL  Date: 07/18/2023  Medication: AmLODipine   Has the patient contacted their pharmacy? Yes (Agent: If no, request that the patient contact the pharmacy for the refill. If patient does not wish to contact the pharmacy document the reason why and proceed with request.) (Agent: If yes, when and what did the pharmacy advise?)  Is this the correct pharmacy for this prescription? Yes If no, delete pharmacy and type the correct one.  This is the patient's preferred pharmacy:    [id number] - [place], [place] - [address] AT [facility] OF [address] [place] [address] Phone: [phone] Fax: [phone]   Has the prescription been filled recently? Yes  Is the patient out of the medication? No, patient has four days   Has the patient been seen for an appointment in the last year OR does the patient have an upcoming appointment? Yes  Can we respond through MyChart? Yes  Agent: Please be advised that Rx refills may take up to 3 business days. We ask that you follow-up with your pharmacy.

## 2023-08-01 NOTE — Telephone Encounter (Signed)
 Last Fill: 05/03/23  Last OV: 07/18/23 Next OV: 07/18/24 AWV  Routing to provider for review/authorization.

## 2023-09-27 DIAGNOSIS — Z79899 Other long term (current) drug therapy: Secondary | ICD-10-CM | POA: Diagnosis not present

## 2023-09-27 DIAGNOSIS — L400 Psoriasis vulgaris: Secondary | ICD-10-CM | POA: Diagnosis not present

## 2023-09-30 LAB — LAB REPORT - SCANNED: EGFR: 80

## 2023-10-03 ENCOUNTER — Telehealth: Payer: Self-pay | Admitting: Family Medicine

## 2023-10-03 NOTE — Telephone Encounter (Signed)
 Copied from CRM [id number]. Topic: Appointments - Scheduling Inquiry for Clinic >> Oct 03, 2023 12:00 PM Corin V wrote: Reason for CRM: Patient is requesting that Dr. Gwenette Lennox request labs that were done last week at [facility] in [place] for the patient. Once labs are received she would like to know if he can order any additional labs for her 6 month follow up that he is wanting and then to schedule a 6 month follow up appointment.

## 2023-10-06 ENCOUNTER — Ambulatory Visit: Admitting: Family Medicine

## 2023-10-09 ENCOUNTER — Encounter: Payer: Self-pay | Admitting: Family Medicine

## 2023-10-09 ENCOUNTER — Other Ambulatory Visit: Payer: Self-pay

## 2023-10-09 DIAGNOSIS — D509 Iron deficiency anemia, unspecified: Secondary | ICD-10-CM

## 2023-10-09 NOTE — Telephone Encounter (Signed)
 Called pt was advised and stated understand, lab appt scheduled.Labs ordered.

## 2023-10-26 ENCOUNTER — Other Ambulatory Visit: Payer: Self-pay | Admitting: Family Medicine

## 2023-10-26 MED ORDER — AMLODIPINE BESYLATE 2.5 MG PO TABS: 2.5000 mg | ORAL_TABLET | Freq: Every day | ORAL | 0 refills | Status: DC

## 2023-10-26 NOTE — Telephone Encounter (Signed)
 Copied from CRM [id number]. Topic: Clinical - Medication Refill >> Oct 26, 2023  2:07 PM [place] E wrote: Medication: amLODipine  (NORVASC ) 2.5 MG tablet  Has the patient contacted their pharmacy? No (Agent: If no, request that the patient contact the pharmacy for the refill. If patient does not wish to contact the pharmacy document the reason why and proceed with request.) (Agent: If yes, when and what did the pharmacy advise?)  This is the patient's preferred pharmacy:   [id number] - [place], [place] - [address] AT [facility] OF [address] [place] [address] Phone: [phone] Fax: [phone]  Is this the correct pharmacy for this prescription? Yes If no, delete pharmacy and type the correct one.   Has the prescription been filled recently? No  Is the patient out of the medication? Yes  Has the patient been seen for an appointment in the last year OR does the patient have an upcoming appointment? Yes  Can we respond through MyChart? Yes  Agent: Please be advised that Rx refills may take up to 3 business days. We ask that you follow-up with your pharmacy.

## 2023-10-26 NOTE — Telephone Encounter (Signed)
 RN updated pt's pharmacy to his preferred pharmacy.

## 2023-12-04 ENCOUNTER — Other Ambulatory Visit: Payer: Self-pay | Admitting: Family Medicine

## 2023-12-04 NOTE — Telephone Encounter (Signed)
30 days only, needs appt.

## 2023-12-04 NOTE — Telephone Encounter (Signed)
 Copied from CRM [id number]. Topic: Clinical - Medication Question >> Dec 04, 2023  2:16 PM Burnard DEL wrote: Reason for CRM: Patient would like to know if she could have a 60 day supply of  amLODipine  (NORVASC ) 2.5 MG tablet  Prescribed this time instead of only 30 pills.Stated that she usually gets 90 but that changed.

## 2024-01-02 ENCOUNTER — Other Ambulatory Visit: Payer: Self-pay | Admitting: Family Medicine

## 2024-01-02 NOTE — Telephone Encounter (Signed)
 Copied from CRM #[id number]. Topic: Clinical - Medication Refill >> Jan 02, 2024 10:48 AM [place] wrote: Medication: amLODipine  (NORVASC ) 2.5 MG tablet  Has the patient contacted their pharmacy? Yes (Agent: If no, request that the patient contact the pharmacy for the refill. If patient does not wish to contact the pharmacy document the reason why and proceed with request.) (Agent: If yes, when and what did the pharmacy advise?)  This is the patient's preferred pharmacy:  [id number] - [place], [place] - [address] AT [facility] OF [address] [place] [address] Phone: [phone] Fax: [phone]    Is this the correct pharmacy for this prescription? Yes If no, delete pharmacy and type the correct one.   Has the prescription been filled recently? Yes  Is the patient out of the medication? Yes  Has the patient been seen for an appointment in the last year OR does the patient have an upcoming appointment? Yes  Can we respond through MyChart? Yes  Agent: Please be advised that Rx refills may take up to 3 business days. We ask that you follow-up with your pharmacy.

## 2024-02-01 ENCOUNTER — Telehealth: Payer: Self-pay

## 2024-02-01 MED ORDER — ATORVASTATIN CALCIUM 10 MG PO TABS: ORAL_TABLET | ORAL | 3 refills | Status: AC

## 2024-02-01 NOTE — Telephone Encounter (Signed)
 Rx sent   Copied from CRM [id number]. Topic: Clinical - Medication Prior Auth >> Feb 01, 2024 10:55 AM Jasmin G wrote: Reason for CRM: Pt's preferred pharmacy, [facility] [phone] - [place], [place] - [address] AT [facility] OF [address] needs prior authorization from PCP. Dr. Frann to refill atorvastatin  (LIPITOR) 10 MG tablet.

## 2024-03-06 ENCOUNTER — Ambulatory Visit: Payer: Self-pay | Admitting: *Deleted

## 2024-03-06 NOTE — Telephone Encounter (Signed)
 Appt scheduled

## 2024-03-06 NOTE — Telephone Encounter (Signed)
 FYI Only or Action Required?: FYI only for provider.  Patient was last seen in primary care on 04/17/2023 by Frann Mabel Mt, DO.  Called Nurse Triage reporting Leg Pain.  Symptoms began about a month ago.  Interventions attempted: Nothing.  Symptoms are: unchanged.  Triage Disposition: See PCP Within 2 Weeks  Patient/caregiver understands and will follow disposition?: Yes               Copied from CRM [id number]. Topic: Clinical - Red Word Triage >> Mar 06, 2024  9:06 AM Charlet HERO wrote: Red Word that prompted transfer to Nurse Triage: Patient is calling stating that the right side of her right leg has burning sensation has been going on for month and on the top of her right foot it feels like a knot is growing on it she dropped something on it years ago and now it is bothering her. Wendling Reason for Disposition  [1] MILD pain (e.g., does not interfere with normal activities) AND [2] present > 7 days  Answer Assessment - Initial Assessment Questions Appt scheduled 03/12/24. Reports burning sensation not painful and does not cause issues with walking. Wants to get PCP to evaluate knot on top of right foot due to size of marble and does not want issue to affect wearing shoes.     1. ONSET: When did the pain start?      1 month 2. LOCATION: Where is the pain located?      Right outer leg from hip to foot  3. PAIN: How bad is the pain?    (Scale 1-10; or mild, moderate, severe)     Mild  4. WORK OR EXERCISE: Has there been any recent work or exercise that involved this part of the body?      na 5. CAUSE: What do you think is causing the leg pain?     No pain but burning sensation  6. OTHER SYMPTOMS: Do you have any other symptoms? (e.g., chest pain, back pain, breathing difficulty, swelling, rash, fever, numbness, weakness)     Marble size knot hard , right foot , burning sensation right leg  7. PREGNANCY: Is there any chance you are  pregnant? When was your last menstrual period?     na  Protocols used: Leg Pain-A-AH

## 2024-03-12 ENCOUNTER — Encounter: Payer: Self-pay | Admitting: Family Medicine

## 2024-03-12 ENCOUNTER — Ambulatory Visit: Admitting: Family Medicine

## 2024-03-12 VITALS — BP 138/72 | HR 70 | Temp 98.0°F | Resp 16 | Ht 61.0 in | Wt 157.6 lb

## 2024-03-12 DIAGNOSIS — M5416 Radiculopathy, lumbar region: Secondary | ICD-10-CM

## 2024-03-12 NOTE — Patient Instructions (Signed)
 Heat (pad or rice pillow in microwave) over affected area, 10-15 minutes twice daily.   Ice/cold pack over area for 10-15 min twice daily.  OK to take Tylenol 1000 mg (2 extra strength tabs) or 975 mg (3 regular strength tabs) every 6 hours as needed.  If you do not hear anything about your referral in the next 1-2 weeks, call our office and ask for an update.  Let us  know if you need anything.

## 2024-03-12 NOTE — Progress Notes (Signed)
 Musculoskeletal Exam  Patient: Victoria Hale DOB: [birth year]  DOS: 03/12/2024  SUBJECTIVE:  Chief Complaint:   Chief Complaint  Patient presents with   Leg Pain    Right Leg Burn    Victoria Hale is a 73 y.o.  female for evaluation and treatment of R leg pain.   Onset:  2 months ago. No inj or change in activity.  Location: outer R thigh and leg down to ankle Character:  burning and tingling  Progression of issue:  has worsened Associated symptoms: +low back pain No bruising, redness, swelling, new loss of bowel/bladder function Treatment: to date has been rest.   Neurovascular symptoms: no  Past Medical History:  Diagnosis Date   ALLERGIC RHINITIS 03/02/2007   ALOPECIA TOTALIS 03/02/2007   ANA POSITIVE 09/03/2009   ANOMALY, CONGENITAL, KIDNEY NEC 03/02/2007   COLONIC POLYPS, HX OF 03/02/2007   DEGENERATION, LUMBAR/LUMBOSACRAL DISC 03/02/2007   DERMATITIS, ATOPIC 03/02/2007   DYSPNEA 03/01/2007   GERD 03/02/2007   Heart murmur    Hidradenitis 03/05/2010   Hyperlipidemia    HYPERTENSION, BENIGN 11/28/2005   LACTOSE INTOLERANCE 03/02/2007   NEPHROLITHIASIS, HX OF 03/02/2007   OTITIS EXTERNA, CHRONIC NEC 03/02/2007   OVERACTIVE BLADDER 09/03/2009   Overweight(278.02) 03/02/2007   PALPITATIONS, RECURRENT 03/02/2007   PHOTOALLERGIC DERMATITIS 03/02/2007   PSORIASIS 08/28/2008   THALASSEMIA NEC 03/02/2007   Unspecified anemia 10/09/2008    Objective: VITAL SIGNS: BP 138/72 (BP Location: Left Arm, Patient Position: Sitting)   Pulse 70   Temp 98 F (36.7 C) (Oral)   Resp 16   Ht 5' 1 (1.549 m)   Wt 157 lb 9.6 oz (71.5 kg)   SpO2 96%   BMI 29.78 kg/m  Constitutional: Well formed, well developed. No acute distress. Thorax & Lungs: No accessory muscle use Musculoskeletal: R leg.   Normal active range of motion: yes.   Normal passive range of motion: yes Tenderness to palpation: no Deformity: no Ecchymosis: no Tests positive: none Tests negative:  Straight leg, Stinchfield, logroll, FABER, FADIR, Ober's Neurologic: Normal sensory function.  DTRs equal and symmetric throughout the lower extremities.  No clonus.  Gait is normal. Psychiatric: Normal mood. Age appropriate judgment and insight. Alert & oriented x 3.    Assessment:  Lumbar radiculopathy - Plan: Ambulatory referral to Physical Therapy  Plan: Physical therapy as she would not be compliant with home stretches/exercises, heat, ice, Tylenol.  Consider referral if no improvement. F/u in 1 mo for a CPE. The patient voiced understanding and agreement to the plan.   Mabel Mt Tif[place], DO 03/12/24  2:00 PM

## 2024-03-21 ENCOUNTER — Other Ambulatory Visit (HOSPITAL_BASED_OUTPATIENT_CLINIC_OR_DEPARTMENT_OTHER): Payer: Self-pay | Admitting: Family Medicine

## 2024-03-21 DIAGNOSIS — Z1231 Encounter for screening mammogram for malignant neoplasm of breast: Secondary | ICD-10-CM

## 2024-04-02 ENCOUNTER — Other Ambulatory Visit: Payer: Self-pay

## 2024-04-02 ENCOUNTER — Ambulatory Visit: Attending: Family Medicine

## 2024-04-02 DIAGNOSIS — M79604 Pain in right leg: Secondary | ICD-10-CM | POA: Diagnosis present

## 2024-04-02 DIAGNOSIS — M5416 Radiculopathy, lumbar region: Secondary | ICD-10-CM | POA: Diagnosis not present

## 2024-04-02 DIAGNOSIS — M6281 Muscle weakness (generalized): Secondary | ICD-10-CM | POA: Insufficient documentation

## 2024-04-02 DIAGNOSIS — R2689 Other abnormalities of gait and mobility: Secondary | ICD-10-CM | POA: Diagnosis present

## 2024-04-02 NOTE — Therapy (Signed)
 OUTPATIENT PHYSICAL THERAPY THORACOLUMBAR EVALUATION   Patient Name: Victoria Hale MRN: [id number] DOB:1951-05-13, 73 y.o., female Today's Date: 04/03/2024  END OF SESSION:  PT End of Session - 04/03/24 1018     Visit Number 1    Number of Visits 5    Date for Recertification  05/15/24    Authorization Type [employer] MCR    PT Start Time 1445    PT Stop Time 1525    PT Time Calculation (min) 40 min    Activity Tolerance Patient tolerated treatment well    Behavior During Therapy WFL for tasks assessed/performed          Past Medical History:  Diagnosis Date   ALLERGIC RHINITIS 03/02/2007   ALOPECIA TOTALIS 03/02/2007   ANA POSITIVE 09/03/2009   ANOMALY, CONGENITAL, KIDNEY NEC 03/02/2007   COLONIC POLYPS, HX OF 03/02/2007   DEGENERATION, LUMBAR/LUMBOSACRAL DISC 03/02/2007   DERMATITIS, ATOPIC 03/02/2007   DYSPNEA 03/01/2007   GERD 03/02/2007   Heart murmur    Hidradenitis 03/05/2010   Hyperlipidemia    HYPERTENSION, BENIGN 11/28/2005   LACTOSE INTOLERANCE 03/02/2007   NEPHROLITHIASIS, HX OF 03/02/2007   OTITIS EXTERNA, CHRONIC NEC 03/02/2007   OVERACTIVE BLADDER 09/03/2009   Overweight(278.02) 03/02/2007   PALPITATIONS, RECURRENT 03/02/2007   PHOTOALLERGIC DERMATITIS 03/02/2007   PSORIASIS 08/28/2008   THALASSEMIA NEC 03/02/2007   Unspecified anemia 10/09/2008   Past Surgical History:  Procedure Laterality Date   CESAREAN SECTION     exploratory abdominal surgury  1970's   s/p BTL     s/p C-section twice     Patient Active Problem List   Diagnosis Date Noted   Prediabetes 08/10/2021   GAD (generalized anxiety disorder) 09/04/2020   Urge incontinence 05/06/2020   Vitamin D  deficiency 06/26/2019   Smoker 12/24/2018   Cough 12/24/2018   Dyspnea 12/24/2018   Ear pain, left 10/26/2016   Neck pain 05/10/2016   Hyperglycemia 04/08/2016   Thyroid  disease 04/08/2016   Rash 04/08/2016   Urinary frequency 04/08/2016   Degenerative cervical disc 10/16/2015    Pain and swelling of left shoulder 10/02/2015   Psoriasiform seborrheic dermatitis 06/26/2014   Tonsillitis 12/28/2012   Mixed hyperlipidemia 12/28/2011   Chronic low back pain 11/15/2011   Neck pain on left side 11/15/2011   Odynophagia 02/18/2011   Neck pain, acute 01/24/2011   Low back pain radiating to right leg 01/24/2011   Anemia, iron deficiency 09/13/2010   Encounter for well adult exam with abnormal findings 09/10/2010   Hidradenitis 03/05/2010   OVERACTIVE BLADDER 09/03/2009   ANA positive 09/03/2009   PSORIASIS 08/28/2008   DERMATITIS, SEBORRHEIC 12/04/2007   LACTOSE INTOLERANCE 03/02/2007   OTITIS EXTERNA, CHRONIC NEC 03/02/2007   Allergic rhinitis 03/02/2007   GERD 03/02/2007   Atopic dermatitis 03/02/2007   PHOTOALLERGIC DERMATITIS 03/02/2007   ALOPECIA TOTALIS 03/02/2007   DEGENERATION, LUMBAR/LUMBOSACRAL DISC 03/02/2007   ANOMALY, CONGENITAL, KIDNEY NEC 03/02/2007   PALPITATIONS, RECURRENT 03/02/2007   History of colonic polyps 03/02/2007   NEPHROLITHIASIS, HX OF 03/02/2007   Essential hypertension 11/28/2005    PCP: Frann Mabel Mt, DO   REFERRING PROVIDER: Frann Mabel Mt, DO   REFERRING DIAG: [phone] (ICD-10-CM) - Lumbar radiculopathy   Rationale for Evaluation and Treatment: Rehabilitation  THERAPY DIAG:  Pain in right leg  Other abnormalities of gait and mobility  Muscle weakness (generalized)  ONSET DATE: 2 months  SUBJECTIVE:  SUBJECTIVE STATEMENT: Pt presents to PT with reports of subacute onset of R LE N/T down lateral side to ankle. Denies bowel/bladder changes or saddle anesthesia. Lying on R side increases symptoms, otherwise she does not feel limited at all and has no LBP to report. Wants to trial PT briefly but then possibly get referral to  specialist to see what's going on.   PERTINENT HISTORY:  See PMH  PAIN:  Are you having pain?  Yes: NPRS scale: 2/10 Worst: 2/10 Pain location: lateral R LE Pain description: tingling, numbness Aggravating factors: lying on R side Relieving factors: none  PRECAUTIONS: None  RED FLAGS: None   WEIGHT BEARING RESTRICTIONS: No  FALLS:  Has patient fallen in last 6 months? No  LIVING ENVIRONMENT: Lives with: lives with their family Lives in: House/apartment Stairs: No Has following equipment at home: None  OCCUPATION: Retired  PLOF: Independent  PATIENT GOALS: decrease N/T in R LE  NEXT MD VISIT: PRN  OBJECTIVE:  Note: Objective measures were completed at Evaluation unless otherwise noted.  DIAGNOSTIC FINDINGS:  N/A  PATIENT SURVEYS:  Modified Oswestry:  MODIFIED OSWESTRY DISABILITY SCALE  Date: 04/03/2024 Score  Pain intensity 0 = I can tolerate the pain I have without having to use pain medication.  2. Personal care (washing, dressing, etc.) 0 =  I can take care of myself normally without causing increased pain.  3. Lifting 0 = I can lift heavy weights without increased pain.  4. Walking 0 = Pain does not prevent me from walking any distance  5. Sitting 0 =  I can sit in any chair as long as I like.  6. Standing 0 =  I can stand as long as I want without increased pain.  7. Sleeping 0 = Pain does not prevent me from sleeping well.  8. Social Life 0 = My social life is normal and does not increase my pain.  9. Traveling 0 =  I can travel anywhere without increased pain.  10. Employment/ Homemaking 0 = My normal homemaking/job activities do not cause pain.  Total 0/50   Interpretation of scores: Score Category Description  0-20% Minimal Disability The patient can cope with most living activities. Usually no treatment is indicated apart from advice on lifting, sitting and exercise  21-40% Moderate Disability The patient experiences more pain and difficulty with  sitting, lifting and standing. Travel and social life are more difficult and they may be disabled from work. Personal care, sexual activity and sleeping are not grossly affected, and the patient can usually be managed by conservative means  41-60% Severe Disability Pain remains the main problem in this group, but activities of daily living are affected. These patients require a detailed investigation  61-80% Crippled Back pain impinges on all aspects of the patient's life. Positive intervention is required  81-100% Bed-bound These patients are either bed-bound or exaggerating their symptoms  Bluford FORBES Zoe DELENA Karon DELENA, et al. Surgery versus conservative management of stable thoracolumbar fracture: the PRESTO feasibility RCT. [place] ([place]): [employer]; 2021 Nov. [facility] Technology Assessment, No. 25.62.) Appendix 3, Oswestry Disability Index category descriptors. Available from: Findjewelers.cz  Minimally Clinically Important Difference (MCID) = 12.8%  COGNITION: Overall cognitive status: Within functional limits for tasks assessed     SENSATION: Light touch: Impaired - Tingling in R lateral LE  POSTURE: rounded shoulders and increased lumbar lordosis  PALPATION: No TTP noted  LUMBAR ROM:   AROM eval  Flexion WNL  Extension WNL  Right lateral flexion   Left lateral flexion   Right rotation   Left rotation    (Blank rows = not tested)  LOWER EXTREMITY MMT:    MMT Right eval Left eval  Hip flexion 5 5  Hip extension    Hip abduction 5 5  Hip adduction    Hip internal rotation    Hip external rotation    Knee flexion 5 5  Knee extension 5 5  Ankle dorsiflexion    Ankle plantarflexion    Ankle inversion    Ankle eversion     (Blank rows = not tested)  LUMBAR SPECIAL TESTS:  Straight leg raise test: Negative and Slump test: Positive  FUNCTIONAL TESTS:  30 Second Sit to Stand: 10 reps  GAIT: Distance walked:  66ft Assistive device utilized: Single point cane Level of assistance: Complete Independence Comments: no overt deviations  TREATMENT: [facility] Adult PT Treatment:                                                DATE: 04/03/2024 Therapeutic Exercise: Standing lumbar ext x 10 Seated fig 4 stretch x 30 R POE x 2 min Supine sciatic nerve glide x 15 R Bridge x 10  PATIENT EDUCATION:  Education details: eval findings, ODI, HEP, POC Person educated: Patient Education method: Explanation, Demonstration, and Handouts Education comprehension: verbalized understanding and returned demonstration  HOME EXERCISE PROGRAM: Access Code: [email or web address] URL: [email or web address] Date: 04/02/2024 Prepared by: Alm Kingdom  Exercises - Standing Lumbar Extension  - 1 x daily - 7 x weekly - 2 sets - 10 reps - Seated Figure 4 Piriformis Stretch (Mirrored)  - 1 x daily - 7 x weekly - 3 sets - 30 sec hold - Static Prone on Elbows  - 1 x daily - 7 x weekly - 2 min hold - Supine Sciatic Nerve Glide  - 1 x daily - 7 x weekly - 3 sets - 10 reps - Supine Bridge  - 1 x daily - 7 x weekly - 3 sets - 10 reps  ASSESSMENT:  CLINICAL IMPRESSION: Patient is a 73 y.o. F who was seen today for physical therapy evaluation and treatment for for referral of N/T into R LE. Physical findings are consistent with subjective impression, as pt only has positive neural tension findings but no overt weakness or functional mobility deficits or lower back pain. Shows some preference with repeated standing extension slightly decreasing symptoms down R LE. ODI score shows no disability in performance of home ADLs and community activities. Pt would benefit from skilled PT services working on decreasing nerve root irritation by trial of progression of extension-based exercises and nerve glides .   OBJECTIVE IMPAIRMENTS: impaired sensation and pain  ACTIVITY LIMITATIONS: sleeping and transfers  PARTICIPATION LIMITATIONS:  driving and community activity  PERSONAL FACTORS: Time since onset of injury/illness/exacerbation are also affecting patient's functional outcome.   REHAB POTENTIAL: Good  CLINICAL DECISION MAKING: Stable/uncomplicated  EVALUATION COMPLEXITY: Low   GOALS: Goals reviewed with patient? No  SHORT TERM GOALS: Target date: 04/23/2024   Pt will be compliant and knowledgeable with initial HEP for improved comfort and carryover Baseline: initial HEP given  Goal status: INITIAL  2.  Pt will self report R LE pain no greater than1/10 for improved comfort and functional ability Baseline: 2/10 at worst Goal status: INITIAL   LONG  TERM GOALS: Target date: 05/15/2024   1.  Pt will self report right LE pain no greater than 0/10 for improved comfort and functional ability Baseline: 2/10 at worst Goal status: INITIAL   2.  Pt will increase 30 Second Sit to Stand rep count to no less than 12 reps for improved balance, strength, and functional mobility Baseline: 10 reps  Goal status: INITIAL   3.  Pt will be compliant and knowledgeable with final HEP for improved comfort and carryover post discharge Baseline: N/A Goal status: INITIAL   PLAN:  PT FREQUENCY: every other week  PT DURATION: 6 weeks  PLANNED INTERVENTIONS: [id number]- PT Re-evaluation, 97110-Therapeutic exercises, 97530- Therapeutic activity, [id number]- Neuromuscular re-education, 97535- Self Care, [id number]- Manual therapy, [id number]- Gait training, [phone]- Electrical stimulation (unattended), [id number]- Electrical stimulation (manual), 20560 (1-2 muscles), 20561 (3+ muscles)- Dry Needling, and Patient/Family education.  PLAN FOR NEXT SESSION: assess HEP response, extension-based stretching  Date of referral: 03/12/2024 Referring provider: Frann Mabel Mt, DO  Referring diagnosis? M54.16 (ICD-10-CM) - Lumbar radiculopathy  Treatment diagnosis? (if different than referring diagnosis)  Pain in right leg Other abnormalities of gait and  mobility Muscle weakness (generalized)  What was this (referring dx) caused by? Arthritis  Nature of Condition: Initial Onset (within last 3 months)   Laterality: Rt  Current Functional Measure Score: Other ODI - 0/50  Objective measurements identify impairments when they are compared to normal values, the uninvolved extremity, and prior level of function.  [x]  Yes  []  No  Objective assessment of functional ability: No functional limitations   Briefly describe symptoms:  Pt presents to PT with reports of subacute onset of R LE N/T down lateral side to ankle. Denies bowel/bladder changes or saddle anesthesia. Lying on R side increases symptoms, otherwise she does not feel limited at all and has no LBP to report. Wants to trial PT briefly but then possibly get referral to specialist to see what's going on.   How did symptoms start:  Insidious onset of N/T that has been progressively moving down to ankle  Average pain intensity:  Last 24 hours: 2/10  Past week: 2/10  How often does the pt experience symptoms? Frequently  How much have the symptoms interfered with usual daily activities? Not at all  How has condition changed since care began at this facility? NA - initial visit  In general, how is the patients overall health? Excellent   BACK PAIN (STarT Back Screening Tool) Has pain spread down the leg(s) at some time in the last 2 weeks? - yes Has there been pain in the shoulder or neck at some time in the last 2 weeks? No Has the pt only walked short distances because of back pain? No Has patient dressed more slowly because of back pain in the past 2 weeks? No Does patient think it's not safe for a person with this condition to be physically active? No Does patient have worrying thoughts a lot of the time? No Does patient feel back pain is terrible and will never get any better? No Has patient stopped enjoying things they usually enjoy? No   Alm JAYSON Kingdom,  PT 04/03/2024, 10:41 AM

## 2024-04-08 ENCOUNTER — Other Ambulatory Visit (INDEPENDENT_AMBULATORY_CARE_PROVIDER_SITE_OTHER)

## 2024-04-08 ENCOUNTER — Encounter (HOSPITAL_BASED_OUTPATIENT_CLINIC_OR_DEPARTMENT_OTHER): Payer: Self-pay | Admitting: Family Medicine

## 2024-04-08 DIAGNOSIS — D509 Iron deficiency anemia, unspecified: Secondary | ICD-10-CM | POA: Diagnosis not present

## 2024-04-09 ENCOUNTER — Ambulatory Visit: Payer: Self-pay | Admitting: Family Medicine

## 2024-04-09 LAB — IBC PANEL
Iron: 70 ug/dL (ref 42–145)
Saturation Ratios: 25 % (ref 20.0–50.0)
TIBC: 280 ug/dL (ref 250.0–450.0)
Transferrin: 200 mg/dL — ABNORMAL LOW (ref 212.0–360.0)

## 2024-04-11 ENCOUNTER — Ambulatory Visit (HOSPITAL_BASED_OUTPATIENT_CLINIC_OR_DEPARTMENT_OTHER)
Admission: RE | Admit: 2024-04-11 | Discharge: 2024-04-11 | Disposition: A | Discharge status: Home / Self Care | Source: Ambulatory Visit | Attending: Family Medicine | Admitting: Family Medicine

## 2024-04-11 ENCOUNTER — Encounter (HOSPITAL_BASED_OUTPATIENT_CLINIC_OR_DEPARTMENT_OTHER): Payer: Self-pay

## 2024-04-11 DIAGNOSIS — Z1231 Encounter for screening mammogram for malignant neoplasm of breast: Secondary | ICD-10-CM | POA: Insufficient documentation

## 2024-04-16 ENCOUNTER — Ambulatory Visit

## 2024-04-16 DIAGNOSIS — R2689 Other abnormalities of gait and mobility: Secondary | ICD-10-CM

## 2024-04-16 DIAGNOSIS — M79604 Pain in right leg: Secondary | ICD-10-CM | POA: Diagnosis not present

## 2024-04-16 DIAGNOSIS — M6281 Muscle weakness (generalized): Secondary | ICD-10-CM

## 2024-04-16 NOTE — Therapy (Signed)
 OUTPATIENT PHYSICAL THERAPY TREATMENT   Patient Name: Victoria Hale MRN: [id number] DOB:Feb 17, 1951, 73 y.o., female Today's Date: 04/16/2024  END OF SESSION:  PT End of Session - 04/16/24 1358     Visit Number 2    Number of Visits 5    Date for Recertification  05/15/24    Authorization Type [employer] MCR    PT Start Time 1400    PT Stop Time 1438    PT Time Calculation (min) 38 min    Activity Tolerance Patient tolerated treatment well    Behavior During Therapy WFL for tasks assessed/performed           Past Medical History:  Diagnosis Date   ALLERGIC RHINITIS 03/02/2007   ALOPECIA TOTALIS 03/02/2007   ANA POSITIVE 09/03/2009   ANOMALY, CONGENITAL, KIDNEY NEC 03/02/2007   COLONIC POLYPS, HX OF 03/02/2007   DEGENERATION, LUMBAR/LUMBOSACRAL DISC 03/02/2007   DERMATITIS, ATOPIC 03/02/2007   DYSPNEA 03/01/2007   GERD 03/02/2007   Heart murmur    Hidradenitis 03/05/2010   Hyperlipidemia    HYPERTENSION, BENIGN 11/28/2005   LACTOSE INTOLERANCE 03/02/2007   NEPHROLITHIASIS, HX OF 03/02/2007   OTITIS EXTERNA, CHRONIC NEC 03/02/2007   OVERACTIVE BLADDER 09/03/2009   Overweight(278.02) 03/02/2007   PALPITATIONS, RECURRENT 03/02/2007   PHOTOALLERGIC DERMATITIS 03/02/2007   PSORIASIS 08/28/2008   THALASSEMIA NEC 03/02/2007   Unspecified anemia 10/09/2008   Past Surgical History:  Procedure Laterality Date   CESAREAN SECTION     exploratory abdominal surgury  1970's   s/p BTL     s/p C-section twice     Patient Active Problem List   Diagnosis Date Noted   Prediabetes 08/10/2021   GAD (generalized anxiety disorder) 09/04/2020   Urge incontinence 05/06/2020   Vitamin D  deficiency 06/26/2019   Smoker 12/24/2018   Cough 12/24/2018   Dyspnea 12/24/2018   Ear pain, left 10/26/2016   Neck pain 05/10/2016   Hyperglycemia 04/08/2016   Thyroid  disease 04/08/2016   Rash 04/08/2016   Urinary frequency 04/08/2016   Degenerative cervical disc 10/16/2015   Pain and  swelling of left shoulder 10/02/2015   Psoriasiform seborrheic dermatitis 06/26/2014   Tonsillitis 12/28/2012   Mixed hyperlipidemia 12/28/2011   Chronic low back pain 11/15/2011   Neck pain on left side 11/15/2011   Odynophagia 02/18/2011   Neck pain, acute 01/24/2011   Low back pain radiating to right leg 01/24/2011   Anemia, iron deficiency 09/13/2010   Encounter for well adult exam with abnormal findings 09/10/2010   Hidradenitis 03/05/2010   OVERACTIVE BLADDER 09/03/2009   ANA positive 09/03/2009   PSORIASIS 08/28/2008   DERMATITIS, SEBORRHEIC 12/04/2007   LACTOSE INTOLERANCE 03/02/2007   OTITIS EXTERNA, CHRONIC NEC 03/02/2007   Allergic rhinitis 03/02/2007   GERD 03/02/2007   Atopic dermatitis 03/02/2007   PHOTOALLERGIC DERMATITIS 03/02/2007   ALOPECIA TOTALIS 03/02/2007   DEGENERATION, LUMBAR/LUMBOSACRAL DISC 03/02/2007   ANOMALY, CONGENITAL, KIDNEY NEC 03/02/2007   PALPITATIONS, RECURRENT 03/02/2007   History of colonic polyps 03/02/2007   NEPHROLITHIASIS, HX OF 03/02/2007   Essential hypertension 11/28/2005    PCP: Frann Mabel Mt, DO   REFERRING PROVIDER: Frann Mabel Mt, DO   REFERRING DIAG: [phone] (ICD-10-CM) - Lumbar radiculopathy   Rationale for Evaluation and Treatment: Rehabilitation  THERAPY DIAG:  Pain in right leg  Other abnormalities of gait and mobility  Muscle weakness (generalized)  ONSET DATE: 2 months  SUBJECTIVE:  SUBJECTIVE STATEMENT: Pt presents to PT with reports of continued lateral R LE numbness/tingling. Has been compliant with HEP.   EVAL: Pt presents to PT with reports of subacute onset of R LE N/T down lateral side to ankle. Denies bowel/bladder changes or saddle anesthesia. Lying on R side increases symptoms, otherwise she does not  feel limited at all and has no LBP to report. Wants to trial PT briefly but then possibly get referral to specialist to see what's going on.   PERTINENT HISTORY:  See PMH  PAIN:  Are you having pain?  Yes: NPRS scale: 2/10 Worst: 2/10 Pain location: lateral R LE Pain description: tingling, numbness Aggravating factors: lying on R side Relieving factors: none  PRECAUTIONS: None  RED FLAGS: None   WEIGHT BEARING RESTRICTIONS: No  FALLS:  Has patient fallen in last 6 months? No  LIVING ENVIRONMENT: Lives with: lives with their family Lives in: House/apartment Stairs: No Has following equipment at home: None  OCCUPATION: Retired  PLOF: Independent  PATIENT GOALS: decrease N/T in R LE  NEXT MD VISIT: PRN  OBJECTIVE:  Note: Objective measures were completed at Evaluation unless otherwise noted.  DIAGNOSTIC FINDINGS:  N/A  PATIENT SURVEYS:  Modified Oswestry:  MODIFIED OSWESTRY DISABILITY SCALE  Date: 04/03/2024 Score  Pain intensity 0 = I can tolerate the pain I have without having to use pain medication.  2. Personal care (washing, dressing, etc.) 0 =  I can take care of myself normally without causing increased pain.  3. Lifting 0 = I can lift heavy weights without increased pain.  4. Walking 0 = Pain does not prevent me from walking any distance  5. Sitting 0 =  I can sit in any chair as long as I like.  6. Standing 0 =  I can stand as long as I want without increased pain.  7. Sleeping 0 = Pain does not prevent me from sleeping well.  8. Social Life 0 = My social life is normal and does not increase my pain.  9. Traveling 0 =  I can travel anywhere without increased pain.  10. Employment/ Homemaking 0 = My normal homemaking/job activities do not cause pain.  Total 0/50   Interpretation of scores: Score Category Description  0-20% Minimal Disability The patient can cope with most living activities. Usually no treatment is indicated apart from advice on  lifting, sitting and exercise  21-40% Moderate Disability The patient experiences more pain and difficulty with sitting, lifting and standing. Travel and social life are more difficult and they may be disabled from work. Personal care, sexual activity and sleeping are not grossly affected, and the patient can usually be managed by conservative means  41-60% Severe Disability Pain remains the main problem in this group, but activities of daily living are affected. These patients require a detailed investigation  61-80% Crippled Back pain impinges on all aspects of the patient's life. Positive intervention is required  81-100% Bed-bound These patients are either bed-bound or exaggerating their symptoms  Bluford FORBES Zoe DELENA Karon DELENA, et al. Surgery versus conservative management of stable thoracolumbar fracture: the PRESTO feasibility RCT. [place] ([place]): [employer]; 2021 Nov. [facility] Technology Assessment, No. 25.62.) Appendix 3, Oswestry Disability Index category descriptors. Available from: Findjewelers.cz  Minimally Clinically Important Difference (MCID) = 12.8%  COGNITION: Overall cognitive status: Within functional limits for tasks assessed     SENSATION: Light touch: Impaired - Tingling in R lateral LE  POSTURE: rounded shoulders and increased lumbar  lordosis  PALPATION: No TTP noted  LUMBAR ROM:   AROM eval  Flexion WNL  Extension WNL  Right lateral flexion   Left lateral flexion   Right rotation   Left rotation    (Blank rows = not tested)  LOWER EXTREMITY MMT:    MMT Right eval Left eval  Hip flexion 5 5  Hip extension    Hip abduction 5 5  Hip adduction    Hip internal rotation    Hip external rotation    Knee flexion 5 5  Knee extension 5 5  Ankle dorsiflexion    Ankle plantarflexion    Ankle inversion    Ankle eversion     (Blank rows = not tested)  LUMBAR SPECIAL TESTS:  Straight leg raise test: Negative and  Slump test: Positive  FUNCTIONAL TESTS:  30 Second Sit to Stand: 10 reps  GAIT: Distance walked: 31ft Assistive device utilized: Single point cane Level of assistance: Complete Independence Comments: no overt deviations  TREATMENT: [facility] Adult PT Treatment:                                                DATE: 04/16/2024 Supine sciatic nerve glide 2x15 R Supine fig 4 piriformis stretch 2x30 R Bridge with GTB 3x10 Hooklying clamshell 2x15 GTB POE x 2 min Standing lumbar ext 2x10 Standing hip ext 2x10 ea Seated fig 4 stretch 2x30 R  [facility] Adult PT Treatment:                                                DATE: 04/03/2024 Therapeutic Exercise: Standing lumbar ext x 10 Seated fig 4 stretch x 30 R POE x 2 min Supine sciatic nerve glide x 15 R Bridge x 10  PATIENT EDUCATION:  Education details: eval findings, ODI, HEP, POC Person educated: Patient Education method: Explanation, Demonstration, and Handouts Education comprehension: verbalized understanding and returned demonstration  HOME EXERCISE PROGRAM: Access Code: [email or web address] URL: [email or web address] Date: 04/16/2024 Prepared by: Alm Kingdom  Exercises - Seated Figure 4 Piriformis Stretch (Mirrored)  - 1 x daily - 7 x weekly - 3 sets - 30 sec hold - Supine Figure 4 Piriformis Stretch  - 1 x daily - 7 x weekly - 2 reps - 30 sec hold - Supine Sciatic Nerve Glide  - 1 x daily - 7 x weekly - 3 sets - 10 reps - Supine Bridge with Resistance Band  - 1 x daily - 7 x weekly - 3 sets - 10 reps - green band hold - Hooklying Clamshell with Resistance  - 1 x daily - 7 x weekly - 3 sets - 15 reps - green band hold - Static Prone on Elbows  - 1 x daily - 7 x weekly - 2 min hold - Standing Hip Extension with Counter Support  - 1 x daily - 7 x weekly - 2 sets - 10 reps - Standing Lumbar Extension  - 1 x daily - 7 x weekly - 2 sets - 10 reps  ASSESSMENT:  CLINICAL IMPRESSION: Pt was able to complete all prescribed  exercises with no adverse effect. Did not note any change in symptoms down R LE. We focused on core/hip strengthening, proximal hip  stretching, and extension based exercises in order to decrease pain and improve comfort. HEP updated for continued progression. Will assess continued response to therapy and progress.   EVAL: Patient is a 73 y.o. F who was seen today for physical therapy evaluation and treatment for for referral of N/T into R LE. Physical findings are consistent with subjective impression, as pt only has positive neural tension findings but no overt weakness or functional mobility deficits or lower back pain. Shows some preference with repeated standing extension slightly decreasing symptoms down R LE. ODI score shows no disability in performance of home ADLs and community activities. Pt would benefit from skilled PT services working on decreasing nerve root irritation by trial of progression of extension-based exercises and nerve glides .   OBJECTIVE IMPAIRMENTS: impaired sensation and pain  ACTIVITY LIMITATIONS: sleeping and transfers  PARTICIPATION LIMITATIONS: driving and community activity  PERSONAL FACTORS: Time since onset of injury/illness/exacerbation are also affecting patient's functional outcome.   REHAB POTENTIAL: Good  CLINICAL DECISION MAKING: Stable/uncomplicated  EVALUATION COMPLEXITY: Low   GOALS: Goals reviewed with patient? No  SHORT TERM GOALS: Target date: 04/23/2024   Pt will be compliant and knowledgeable with initial HEP for improved comfort and carryover Baseline: initial HEP given  Goal status: MET  2.  Pt will self report R LE pain no greater than1/10 for improved comfort and functional ability Baseline: 2/10 at worst Goal status: INITIAL   LONG TERM GOALS: Target date: 05/15/2024   1.  Pt will self report right LE pain no greater than 0/10 for improved comfort and functional ability Baseline: 2/10 at worst Goal status: INITIAL   2.  Pt  will increase 30 Second Sit to Stand rep count to no less than 12 reps for improved balance, strength, and functional mobility Baseline: 10 reps  Goal status: INITIAL   3.  Pt will be compliant and knowledgeable with final HEP for improved comfort and carryover post discharge Baseline: N/A Goal status: INITIAL   PLAN:  PT FREQUENCY: every other week  PT DURATION: 6 weeks  PLANNED INTERVENTIONS: [id number]- PT Re-evaluation, 97110-Therapeutic exercises, 97530- Therapeutic activity, [id number]- Neuromuscular re-education, 97535- Self Care, [id number]- Manual therapy, [id number]- Gait training, [phone]- Electrical stimulation (unattended), [id number]- Electrical stimulation (manual), 20560 (1-2 muscles), 20561 (3+ muscles)- Dry Needling, and Patient/Family education.  PLAN FOR NEXT SESSION: assess HEP response, extension-based stretching  Date of referral: 03/12/2024 Referring provider: Frann Mabel Mt, DO  Referring diagnosis? M54.16 (ICD-10-CM) - Lumbar radiculopathy  Treatment diagnosis? (if different than referring diagnosis)  Pain in right leg Other abnormalities of gait and mobility Muscle weakness (generalized)  What was this (referring dx) caused by? Arthritis  Nature of Condition: Initial Onset (within last 3 months)   Laterality: Rt  Current Functional Measure Score: Other ODI - 0/50  Objective measurements identify impairments when they are compared to normal values, the uninvolved extremity, and prior level of function.  [x]  Yes  []  No  Objective assessment of functional ability: No functional limitations   Briefly describe symptoms:  Pt presents to PT with reports of subacute onset of R LE N/T down lateral side to ankle. Denies bowel/bladder changes or saddle anesthesia. Lying on R side increases symptoms, otherwise she does not feel limited at all and has no LBP to report. Wants to trial PT briefly but then possibly get referral to specialist to see what's going on.   How did  symptoms start:  Insidious onset of N/T that has been progressively moving down to  ankle  Average pain intensity:  Last 24 hours: 2/10  Past week: 2/10  How often does the pt experience symptoms? Frequently  How much have the symptoms interfered with usual daily activities? Not at all  How has condition changed since care began at this facility? NA - initial visit  In general, how is the patients overall health? Excellent   BACK PAIN (STarT Back Screening Tool) Has pain spread down the leg(s) at some time in the last 2 weeks? - yes Has there been pain in the shoulder or neck at some time in the last 2 weeks? No Has the pt only walked short distances because of back pain? No Has patient dressed more slowly because of back pain in the past 2 weeks? No Does patient think it's not safe for a person with this condition to be physically active? No Does patient have worrying thoughts a lot of the time? No Does patient feel back pain is terrible and will never get any better? No Has patient stopped enjoying things they usually enjoy? No   Alm JAYSON Kingdom, PT 04/16/2024, 3:50 PM

## 2024-04-30 ENCOUNTER — Ambulatory Visit

## 2024-05-13 ENCOUNTER — Ambulatory Visit: Attending: Family Medicine

## 2024-05-13 ENCOUNTER — Ambulatory Visit

## 2024-05-13 DIAGNOSIS — M6281 Muscle weakness (generalized): Secondary | ICD-10-CM | POA: Insufficient documentation

## 2024-05-13 DIAGNOSIS — R2689 Other abnormalities of gait and mobility: Secondary | ICD-10-CM | POA: Diagnosis present

## 2024-05-13 DIAGNOSIS — M79604 Pain in right leg: Secondary | ICD-10-CM | POA: Diagnosis present

## 2024-05-13 NOTE — Therapy (Addendum)
 " OUTPATIENT PHYSICAL THERAPY TREATMENT/DISCHARGE   Patient Name: Victoria Hale MRN: [id number] DOB:1950/07/31, 73 y.o., female Today's Date: 05/13/2024  END OF SESSION:  PT End of Session - 05/13/24 0915     Visit Number 3    Number of Visits 5    Date for Recertification  05/15/24    Authorization Type [employer] MCR    PT Start Time 0915    Activity Tolerance Patient tolerated treatment well    Behavior During Therapy [facility] for tasks assessed/performed            Past Medical History:  Diagnosis Date   ALLERGIC RHINITIS 03/02/2007   ALOPECIA TOTALIS 03/02/2007   ANA POSITIVE 09/03/2009   ANOMALY, CONGENITAL, KIDNEY NEC 03/02/2007   COLONIC POLYPS, HX OF 03/02/2007   DEGENERATION, LUMBAR/LUMBOSACRAL DISC 03/02/2007   DERMATITIS, ATOPIC 03/02/2007   DYSPNEA 03/01/2007   GERD 03/02/2007   Heart murmur    Hidradenitis 03/05/2010   Hyperlipidemia    HYPERTENSION, BENIGN 11/28/2005   LACTOSE INTOLERANCE 03/02/2007   NEPHROLITHIASIS, HX OF 03/02/2007   OTITIS EXTERNA, CHRONIC NEC 03/02/2007   OVERACTIVE BLADDER 09/03/2009   Overweight(278.02) 03/02/2007   PALPITATIONS, RECURRENT 03/02/2007   PHOTOALLERGIC DERMATITIS 03/02/2007   PSORIASIS 08/28/2008   THALASSEMIA NEC 03/02/2007   Unspecified anemia 10/09/2008   Past Surgical History:  Procedure Laterality Date   CESAREAN SECTION     exploratory abdominal surgury  1970's   s/p BTL     s/p C-section twice     Patient Active Problem List   Diagnosis Date Noted   Prediabetes 08/10/2021   GAD (generalized anxiety disorder) 09/04/2020   Urge incontinence 05/06/2020   Vitamin D  deficiency 06/26/2019   Smoker 12/24/2018   Cough 12/24/2018   Dyspnea 12/24/2018   Ear pain, left 10/26/2016   Neck pain 05/10/2016   Hyperglycemia 04/08/2016   Thyroid  disease 04/08/2016   Rash 04/08/2016   Urinary frequency 04/08/2016   Degenerative cervical disc 10/16/2015   Pain and swelling of left shoulder 10/02/2015   Psoriasiform  seborrheic dermatitis 06/26/2014   Tonsillitis 12/28/2012   Mixed hyperlipidemia 12/28/2011   Chronic low back pain 11/15/2011   Neck pain on left side 11/15/2011   Odynophagia 02/18/2011   Neck pain, acute 01/24/2011   Low back pain radiating to right leg 01/24/2011   Anemia, iron deficiency 09/13/2010   Encounter for well adult exam with abnormal findings 09/10/2010   Hidradenitis 03/05/2010   OVERACTIVE BLADDER 09/03/2009   ANA positive 09/03/2009   PSORIASIS 08/28/2008   DERMATITIS, SEBORRHEIC 12/04/2007   LACTOSE INTOLERANCE 03/02/2007   OTITIS EXTERNA, CHRONIC NEC 03/02/2007   Allergic rhinitis 03/02/2007   GERD 03/02/2007   Atopic dermatitis 03/02/2007   PHOTOALLERGIC DERMATITIS 03/02/2007   ALOPECIA TOTALIS 03/02/2007   DEGENERATION, LUMBAR/LUMBOSACRAL DISC 03/02/2007   ANOMALY, CONGENITAL, KIDNEY NEC 03/02/2007   PALPITATIONS, RECURRENT 03/02/2007   History of colonic polyps 03/02/2007   NEPHROLITHIASIS, HX OF 03/02/2007   Essential hypertension 11/28/2005    PCP: Frann Mabel Mt, DO   REFERRING PROVIDER: Frann Mabel Mt, DO   REFERRING DIAG: [phone] (ICD-10-CM) - Lumbar radiculopathy   Rationale for Evaluation and Treatment: Rehabilitation  THERAPY DIAG:  Pain in right leg  Other abnormalities of gait and mobility  Muscle weakness (generalized)  ONSET DATE: 2 months  SUBJECTIVE:  SUBJECTIVE STATEMENT: Same.  Still notes N&T down RLE.  Unable to relate distinct aggravating or relieving factors.  Symptoms do not limit activity as she is able to work in the yard w/o limitation.  EVAL: Pt presents to PT with reports of subacute onset of R LE N/T down lateral side to ankle. Denies bowel/bladder changes or saddle anesthesia. Lying on R side increases symptoms,  otherwise she does not feel limited at all and has no LBP to report. Wants to trial PT briefly but then possibly get referral to specialist to see what's going on.   PERTINENT HISTORY:  See PMH  PAIN:  Are you having pain?  Yes: NPRS scale: 2/10 Worst: 2/10 Pain location: lateral R LE Pain description: tingling, numbness Aggravating factors: lying on R side Relieving factors: none  PRECAUTIONS: None  RED FLAGS: None   WEIGHT BEARING RESTRICTIONS: No  FALLS:  Has patient fallen in last 6 months? No  LIVING ENVIRONMENT: Lives with: lives with their family Lives in: House/apartment Stairs: No Has following equipment at home: None  OCCUPATION: Retired  PLOF: Independent  PATIENT GOALS: decrease N/T in R LE  NEXT MD VISIT: PRN  OBJECTIVE:  Note: Objective measures were completed at Evaluation unless otherwise noted.  DIAGNOSTIC FINDINGS:  N/A  PATIENT SURVEYS:  Modified Oswestry:  MODIFIED OSWESTRY DISABILITY SCALE  Date: 04/03/2024 Score  Pain intensity 0 = I can tolerate the pain I have without having to use pain medication.  2. Personal care (washing, dressing, etc.) 0 =  I can take care of myself normally without causing increased pain.  3. Lifting 0 = I can lift heavy weights without increased pain.  4. Walking 0 = Pain does not prevent me from walking any distance  5. Sitting 0 =  I can sit in any chair as long as I like.  6. Standing 0 =  I can stand as long as I want without increased pain.  7. Sleeping 0 = Pain does not prevent me from sleeping well.  8. Social Life 0 = My social life is normal and does not increase my pain.  9. Traveling 0 =  I can travel anywhere without increased pain.  10. Employment/ Homemaking 0 = My normal homemaking/job activities do not cause pain.  Total 0/50   Interpretation of scores: Score Category Description  0-20% Minimal Disability The patient can cope with most living activities. Usually no treatment is indicated  apart from advice on lifting, sitting and exercise  21-40% Moderate Disability The patient experiences more pain and difficulty with sitting, lifting and standing. Travel and social life are more difficult and they may be disabled from work. Personal care, sexual activity and sleeping are not grossly affected, and the patient can usually be managed by conservative means  41-60% Severe Disability Pain remains the main problem in this group, but activities of daily living are affected. These patients require a detailed investigation  61-80% Crippled Back pain impinges on all aspects of the patients life. Positive intervention is required  81-100% Bed-bound These patients are either bed-bound or exaggerating their symptoms  Bluford FORBES Zoe DELENA Karon DELENA, et al. Surgery versus conservative management of stable thoracolumbar fracture: the PRESTO feasibility RCT. [place] ([place]): [employer]; 2021 Nov. [facility] Technology Assessment, No. 25.62.) Appendix 3, Oswestry Disability Index category descriptors. Available from: Findjewelers.cz  Minimally Clinically Important Difference (MCID) = 12.8%  COGNITION: Overall cognitive status: Within functional limits for tasks assessed     SENSATION: Light  touch: Impaired - Tingling in R lateral LE  POSTURE: rounded shoulders and increased lumbar lordosis  PALPATION: No TTP noted  LUMBAR ROM:   AROM eval  Flexion WNL  Extension WNL  Right lateral flexion   Left lateral flexion   Right rotation   Left rotation    (Blank rows = not tested)  LOWER EXTREMITY MMT:    MMT Right eval Left eval  Hip flexion 5 5  Hip extension    Hip abduction 5 5  Hip adduction    Hip internal rotation    Hip external rotation    Knee flexion 5 5  Knee extension 5 5  Ankle dorsiflexion    Ankle plantarflexion    Ankle inversion    Ankle eversion     (Blank rows = not tested)  LUMBAR SPECIAL TESTS:  Straight leg raise  test: Negative and Slump test: Positive  FUNCTIONAL TESTS:  30 Second Sit to Stand: 10 reps; 05/13/24 13  GAIT: Distance walked: 69ft Assistive device utilized: Single point cane Level of assistance: Complete Independence Comments: no overt deviations  TREATMENT: [facility] Adult PT Treatment:                                                DATE: 05/13/24 Therapeutic Exercise: Nustep L2 8 min Neuromuscular re-ed: Supine hip fallouts BluTB 15x B, 15/15 Supine Bridge BluTB 15x S/L clams BluTB 15/15 Therapeutic Activity: Re-assessment of goal progress for re-cert Seated hamstring stretch 30s x2 B Supine R hip flexor stretch 30s x2 Supine QL stretch 30s x2 B Open book 10/10  [facility] Adult PT Treatment:                                                DATE: 04/16/2024 Supine sciatic nerve glide 2x15 R Supine fig 4 piriformis stretch 2x30 R Bridge with GTB 3x10 Hooklying clamshell 2x15 GTB POE x 2 min Standing lumbar ext 2x10 Standing hip ext 2x10 ea Seated fig 4 stretch 2x30 R  [facility] Adult PT Treatment:                                                DATE: 04/03/2024 Therapeutic Exercise: Standing lumbar ext x 10 Seated fig 4 stretch x 30 R POE x 2 min Supine sciatic nerve glide x 15 R Bridge x 10  PATIENT EDUCATION:  Education details: eval findings, ODI, HEP, POC Person educated: Patient Education method: Explanation, Demonstration, and Handouts Education comprehension: verbalized understanding and returned demonstration  HOME EXERCISE PROGRAM: Access Code: [email or web address] URL: [email or web address] Date: 04/16/2024 Prepared by: Alm Kingdom  Exercises - Seated Figure 4 Piriformis Stretch (Mirrored)  - 1 x daily - 7 x weekly - 3 sets - 30 sec hold - Supine Figure 4 Piriformis Stretch  - 1 x daily - 7 x weekly - 2 reps - 30 sec hold - Supine Sciatic Nerve Glide  - 1 x daily - 7 x weekly - 3 sets - 10 reps - Supine Bridge with Resistance Band  - 1 x daily - 7 x  weekly - 3 sets -  10 reps - green band hold - Hooklying Clamshell with Resistance  - 1 x daily - 7 x weekly - 3 sets - 15 reps - green band hold - Static Prone on Elbows  - 1 x daily - 7 x weekly - 2 min hold - Standing Hip Extension with Counter Support  - 1 x daily - 7 x weekly - 2 sets - 10 reps - Standing Lumbar Extension  - 1 x daily - 7 x weekly - 2 sets - 10 reps  ASSESSMENT:  CLINICAL IMPRESSION:  Patient returns for third f/u session since IE.  Symptoms unchanged but do not limit activity either.  Unable to describe distinct aggravating or relieving positions/activities.  30s chair stand goal met.  Focus of today was aerobic w/u followed by stretching tasks and lumbosacral strengthening.  No ITB tightness detected.  EVAL: Patient is a 73 y.o. F who was seen today for physical therapy evaluation and treatment for for referral of N/T into R LE. Physical findings are consistent with subjective impression, as pt only has positive neural tension findings but no overt weakness or functional mobility deficits or lower back pain. Shows some preference with repeated standing extension slightly decreasing symptoms down R LE. ODI score shows no disability in performance of home ADLs and community activities. Pt would benefit from skilled PT services working on decreasing nerve root irritation by trial of progression of extension-based exercises and nerve glides .   OBJECTIVE IMPAIRMENTS: impaired sensation and pain  ACTIVITY LIMITATIONS: sleeping and transfers  PARTICIPATION LIMITATIONS: driving and community activity  PERSONAL FACTORS: Time since onset of injury/illness/exacerbation are also affecting patient's functional outcome.   REHAB POTENTIAL: Good  CLINICAL DECISION MAKING: Stable/uncomplicated  EVALUATION COMPLEXITY: Low   GOALS: Goals reviewed with patient? No  SHORT TERM GOALS: Target date: 04/23/2024   Pt will be compliant and knowledgeable with initial HEP for improved  comfort and carryover Baseline: initial HEP given  Goal status: MET  2.  Pt will self report R LE pain no greater than1/10 for improved comfort and functional ability Baseline: 2/10 at worst; 05/13/24 unchanged Goal status: Ongoing  LONG TERM GOALS: Target date: 05/15/2024   1.  Pt will self report right LE pain no greater than 0/10 for improved comfort and functional ability Baseline: 2/10 at worst; 05/13/24 unchanged Goal status: Ongoing  2.  Pt will increase 30 Second Sit to Stand rep count to no less than 12 reps for improved balance, strength, and functional mobility Baseline: 10 reps; 05/13/24 13 reps Goal status: Met  3.  Pt will be compliant and knowledgeable with final HEP for improved comfort and carryover post discharge Baseline: N/A Goal status: Ongoing   PLAN:  PT FREQUENCY: every other week  PT DURATION: 6 weeks  PLANNED INTERVENTIONS: [id number]- PT Re-evaluation, 97110-Therapeutic exercises, 97530- Therapeutic activity, [id number]- Neuromuscular re-education, 97535- Self Care, [id number]- Manual therapy, [id number]- Gait training, [phone]- Electrical stimulation (unattended), [id number]- Electrical stimulation (manual), 20560 (1-2 muscles), 20561 (3+ muscles)- Dry Needling, and Patient/Family education.  PLAN FOR NEXT SESSION: assess HEP response, extension-based stretching  Date of referral: 03/12/2024 Referring provider: Frann Mabel Mt, DO  Referring diagnosis? M54.16 (ICD-10-CM) - Lumbar radiculopathy  Treatment diagnosis? (if different than referring diagnosis)  Pain in right leg Other abnormalities of gait and mobility Muscle weakness (generalized)  What was this (referring dx) caused by? Arthritis  Nature of Condition: Initial Onset (within last 3 months)   Laterality: Rt  Current Functional Measure Score: Other ODI -  0/50  Objective measurements identify impairments when they are compared to normal values, the uninvolved extremity, and prior level of  function.  [x]  Yes  []  No  Objective assessment of functional ability: No functional limitations   Briefly describe symptoms:  Pt presents to PT with reports of subacute onset of R LE N/T down lateral side to ankle. Denies bowel/bladder changes or saddle anesthesia. Lying on R side increases symptoms, otherwise she does not feel limited at all and has no LBP to report. Wants to trial PT briefly but then possibly get referral to specialist to see what's going on.   How did symptoms start:  Insidious onset of N/T that has been progressively moving down to ankle  Average pain intensity:  Last 24 hours: 2/10  Past week: 2/10  How often does the pt experience symptoms? Frequently  How much have the symptoms interfered with usual daily activities? Not at all  How has condition changed since care began at this facility? NA - initial visit  In general, how is the patients overall health? Excellent   BACK PAIN (STarT Back Screening Tool) Has pain spread down the leg(s) at some time in the last 2 weeks? - yes Has there been pain in the shoulder or neck at some time in the last 2 weeks? No Has the pt only walked short distances because of back pain? No Has patient dressed more slowly because of back pain in the past 2 weeks? No Does patient think it's not safe for a person with this condition to be physically active? No Does patient have worrying thoughts a lot of the time? No Does patient feel back pain is terrible and will never get any better? No Has patient stopped enjoying things they usually enjoy? No   Yaniv Lage M Quintez Maselli, PT 05/13/2024, 9:18 AM  "

## 2024-06-06 ENCOUNTER — Ambulatory Visit

## 2024-06-06 NOTE — Therapy (Incomplete)
 " OUTPATIENT PHYSICAL THERAPY TREATMENT   Patient Name: Victoria Hale MRN: [id number] DOB:[birth year], 74 y.o., female Today's Date: 06/06/2024  END OF SESSION:      Past Medical History:  Diagnosis Date   ALLERGIC RHINITIS 03/02/2007   ALOPECIA TOTALIS 03/02/2007   ANA POSITIVE 09/03/2009   ANOMALY, CONGENITAL, KIDNEY NEC 03/02/2007   COLONIC POLYPS, HX OF 03/02/2007   DEGENERATION, LUMBAR/LUMBOSACRAL DISC 03/02/2007   DERMATITIS, ATOPIC 03/02/2007   DYSPNEA 03/01/2007   GERD 03/02/2007   Heart murmur    Hidradenitis 03/05/2010   Hyperlipidemia    HYPERTENSION, BENIGN 11/28/2005   LACTOSE INTOLERANCE 03/02/2007   NEPHROLITHIASIS, HX OF 03/02/2007   OTITIS EXTERNA, CHRONIC NEC 03/02/2007   OVERACTIVE BLADDER 09/03/2009   Overweight(278.02) 03/02/2007   PALPITATIONS, RECURRENT 03/02/2007   PHOTOALLERGIC DERMATITIS 03/02/2007   PSORIASIS 08/28/2008   THALASSEMIA NEC 03/02/2007   Unspecified anemia 10/09/2008   Past Surgical History:  Procedure Laterality Date   CESAREAN SECTION     exploratory abdominal surgury  1970's   s/p BTL     s/p C-section twice     Patient Active Problem List   Diagnosis Date Noted   Prediabetes 08/10/2021   GAD (generalized anxiety disorder) 09/04/2020   Urge incontinence 05/06/2020   Vitamin D  deficiency 06/26/2019   Smoker 12/24/2018   Cough 12/24/2018   Dyspnea 12/24/2018   Ear pain, left 10/26/2016   Neck pain 05/10/2016   Hyperglycemia 04/08/2016   Thyroid  disease 04/08/2016   Rash 04/08/2016   Urinary frequency 04/08/2016   Degenerative cervical disc 10/16/2015   Pain and swelling of left shoulder 10/02/2015   Psoriasiform seborrheic dermatitis 06/26/2014   Tonsillitis 12/28/2012   Mixed hyperlipidemia 12/28/2011   Chronic low back pain 11/15/2011   Neck pain on left side 11/15/2011   Odynophagia 02/18/2011   Neck pain, acute 01/24/2011   Low back pain radiating to right leg 01/24/2011   Anemia, iron deficiency  09/13/2010   Encounter for well adult exam with abnormal findings 09/10/2010   Hidradenitis 03/05/2010   OVERACTIVE BLADDER 09/03/2009   ANA positive 09/03/2009   PSORIASIS 08/28/2008   DERMATITIS, SEBORRHEIC 12/04/2007   LACTOSE INTOLERANCE 03/02/2007   OTITIS EXTERNA, CHRONIC NEC 03/02/2007   Allergic rhinitis 03/02/2007   GERD 03/02/2007   Atopic dermatitis 03/02/2007   PHOTOALLERGIC DERMATITIS 03/02/2007   ALOPECIA TOTALIS 03/02/2007   DEGENERATION, LUMBAR/LUMBOSACRAL DISC 03/02/2007   ANOMALY, CONGENITAL, KIDNEY NEC 03/02/2007   PALPITATIONS, RECURRENT 03/02/2007   History of colonic polyps 03/02/2007   NEPHROLITHIASIS, HX OF 03/02/2007   Essential hypertension 11/28/2005    PCP: Frann Mabel Mt, DO   REFERRING PROVIDER: Frann Mabel Mt, DO   REFERRING DIAG: [phone] (ICD-10-CM) - Lumbar radiculopathy   Rationale for Evaluation and Treatment: Rehabilitation  THERAPY DIAG:  No diagnosis found.  ONSET DATE: 2 months  SUBJECTIVE:  SUBJECTIVE STATEMENT: Same.  Still notes N&T down RLE.  Unable to relate distinct aggravating or relieving factors.  Symptoms do not limit activity as she is able to work in the yard w/o limitation.  EVAL: Pt presents to PT with reports of subacute onset of R LE N/T down lateral side to ankle. Denies bowel/bladder changes or saddle anesthesia. Lying on R side increases symptoms, otherwise she does not feel limited at all and has no LBP to report. Wants to trial PT briefly but then possibly get referral to specialist to see what's going on.   PERTINENT HISTORY:  See PMH  PAIN:  Are you having pain?  Yes: NPRS scale: 2/10 Worst: 2/10 Pain location: lateral R LE Pain description: tingling, numbness Aggravating factors: lying on R  side Relieving factors: none  PRECAUTIONS: None  RED FLAGS: None   WEIGHT BEARING RESTRICTIONS: No  FALLS:  Has patient fallen in last 6 months? No  LIVING ENVIRONMENT: Lives with: lives with their family Lives in: House/apartment Stairs: No Has following equipment at home: None  OCCUPATION: Retired  PLOF: Independent  PATIENT GOALS: decrease N/T in R LE  NEXT MD VISIT: PRN  OBJECTIVE:  Note: Objective measures were completed at Evaluation unless otherwise noted.  DIAGNOSTIC FINDINGS:  N/A  PATIENT SURVEYS:  Modified Oswestry:  MODIFIED OSWESTRY DISABILITY SCALE  Date: 04/03/2024 Score  Pain intensity 0 = I can tolerate the pain I have without having to use pain medication.  2. Personal care (washing, dressing, etc.) 0 =  I can take care of myself normally without causing increased pain.  3. Lifting 0 = I can lift heavy weights without increased pain.  4. Walking 0 = Pain does not prevent me from walking any distance  5. Sitting 0 =  I can sit in any chair as long as I like.  6. Standing 0 =  I can stand as long as I want without increased pain.  7. Sleeping 0 = Pain does not prevent me from sleeping well.  8. Social Life 0 = My social life is normal and does not increase my pain.  9. Traveling 0 =  I can travel anywhere without increased pain.  10. Employment/ Homemaking 0 = My normal homemaking/job activities do not cause pain.  Total 0/50   Interpretation of scores: Score Category Description  0-20% Minimal Disability The patient can cope with most living activities. Usually no treatment is indicated apart from advice on lifting, sitting and exercise  21-40% Moderate Disability The patient experiences more pain and difficulty with sitting, lifting and standing. Travel and social life are more difficult and they may be disabled from work. Personal care, sexual activity and sleeping are not grossly affected, and the patient can usually be managed by conservative  means  41-60% Severe Disability Pain remains the main problem in this group, but activities of daily living are affected. These patients require a detailed investigation  61-80% Crippled Back pain impinges on all aspects of the patients life. Positive intervention is required  81-100% Bed-bound These patients are either bed-bound or exaggerating their symptoms  Bluford FORBES Zoe DELENA Karon DELENA, et al. Surgery versus conservative management of stable thoracolumbar fracture: the PRESTO feasibility RCT. [place] ([place]): [employer]; 2021 Nov. [facility] Technology Assessment, No. 25.62.) Appendix 3, Oswestry Disability Index category descriptors. Available from: Findjewelers.cz  Minimally Clinically Important Difference (MCID) = 12.8%  COGNITION: Overall cognitive status: Within functional limits for tasks assessed     SENSATION: Light  touch: Impaired - Tingling in R lateral LE  POSTURE: rounded shoulders and increased lumbar lordosis  PALPATION: No TTP noted  LUMBAR ROM:   AROM eval  Flexion WNL  Extension WNL  Right lateral flexion   Left lateral flexion   Right rotation   Left rotation    (Blank rows = not tested)  LOWER EXTREMITY MMT:    MMT Right eval Left eval  Hip flexion 5 5  Hip extension    Hip abduction 5 5  Hip adduction    Hip internal rotation    Hip external rotation    Knee flexion 5 5  Knee extension 5 5  Ankle dorsiflexion    Ankle plantarflexion    Ankle inversion    Ankle eversion     (Blank rows = not tested)  LUMBAR SPECIAL TESTS:  Straight leg raise test: Negative and Slump test: Positive  FUNCTIONAL TESTS:  30 Second Sit to Stand: 10 reps; 05/13/24 13  GAIT: Distance walked: 22ft Assistive device utilized: Single point cane Level of assistance: Complete Independence Comments: no overt deviations  TREATMENT: [facility] Adult PT Treatment:                                                DATE:  05/13/24 Therapeutic Exercise: Nustep L2 8 min Neuromuscular re-ed: Supine hip fallouts BluTB 15x B, 15/15 Supine Bridge BluTB 15x S/L clams BluTB 15/15 Therapeutic Activity: Re-assessment of goal progress for re-cert Seated hamstring stretch 30s x2 B Supine R hip flexor stretch 30s x2 Supine QL stretch 30s x2 B Open book 10/10  [facility] Adult PT Treatment:                                                DATE: 04/16/2024 Supine sciatic nerve glide 2x15 R Supine fig 4 piriformis stretch 2x30 R Bridge with GTB 3x10 Hooklying clamshell 2x15 GTB POE x 2 min Standing lumbar ext 2x10 Standing hip ext 2x10 ea Seated fig 4 stretch 2x30 R  [facility] Adult PT Treatment:                                                DATE: 04/03/2024 Therapeutic Exercise: Standing lumbar ext x 10 Seated fig 4 stretch x 30 R POE x 2 min Supine sciatic nerve glide x 15 R Bridge x 10  PATIENT EDUCATION:  Education details: eval findings, ODI, HEP, POC Person educated: Patient Education method: Explanation, Demonstration, and Handouts Education comprehension: verbalized understanding and returned demonstration  HOME EXERCISE PROGRAM: Access Code: [email or web address] URL: [email or web address] Date: 04/16/2024 Prepared by: Alm Kingdom  Exercises - Seated Figure 4 Piriformis Stretch (Mirrored)  - 1 x daily - 7 x weekly - 3 sets - 30 sec hold - Supine Figure 4 Piriformis Stretch  - 1 x daily - 7 x weekly - 2 reps - 30 sec hold - Supine Sciatic Nerve Glide  - 1 x daily - 7 x weekly - 3 sets - 10 reps - Supine Bridge with Resistance Band  - 1 x daily - 7 x weekly - 3 sets -  10 reps - green band hold - Hooklying Clamshell with Resistance  - 1 x daily - 7 x weekly - 3 sets - 15 reps - green band hold - Static Prone on Elbows  - 1 x daily - 7 x weekly - 2 min hold - Standing Hip Extension with Counter Support  - 1 x daily - 7 x weekly - 2 sets - 10 reps - Standing Lumbar Extension  - 1 x daily - 7 x weekly -  2 sets - 10 reps  ASSESSMENT:  CLINICAL IMPRESSION:  Patient returns for third f/u session since IE.  Symptoms unchanged but do not limit activity either.  Unable to describe distinct aggravating or relieving positions/activities.  30s chair stand goal met.  Focus of today was aerobic w/u followed by stretching tasks and lumbosacral strengthening.  No ITB tightness detected.  EVAL: Patient is a 74 y.o. F who was seen today for physical therapy evaluation and treatment for for referral of N/T into R LE. Physical findings are consistent with subjective impression, as pt only has positive neural tension findings but no overt weakness or functional mobility deficits or lower back pain. Shows some preference with repeated standing extension slightly decreasing symptoms down R LE. ODI score shows no disability in performance of home ADLs and community activities. Pt would benefit from skilled PT services working on decreasing nerve root irritation by trial of progression of extension-based exercises and nerve glides .   OBJECTIVE IMPAIRMENTS: impaired sensation and pain  ACTIVITY LIMITATIONS: sleeping and transfers  PARTICIPATION LIMITATIONS: driving and community activity  PERSONAL FACTORS: Time since onset of injury/illness/exacerbation are also affecting patient's functional outcome.   REHAB POTENTIAL: Good  CLINICAL DECISION MAKING: Stable/uncomplicated  EVALUATION COMPLEXITY: Low   GOALS: Goals reviewed with patient? No  SHORT TERM GOALS: Target date: 04/23/2024   Pt will be compliant and knowledgeable with initial HEP for improved comfort and carryover Baseline: initial HEP given  Goal status: MET  2.  Pt will self report R LE pain no greater than1/10 for improved comfort and functional ability Baseline: 2/10 at worst; 05/13/24 unchanged Goal status: Ongoing  LONG TERM GOALS: Target date: 05/15/2024   1.  Pt will self report right LE pain no greater than 0/10 for improved  comfort and functional ability Baseline: 2/10 at worst; 05/13/24 unchanged Goal status: Ongoing  2.  Pt will increase 30 Second Sit to Stand rep count to no less than 12 reps for improved balance, strength, and functional mobility Baseline: 10 reps; 05/13/24 13 reps Goal status: Met  3.  Pt will be compliant and knowledgeable with final HEP for improved comfort and carryover post discharge Baseline: N/A Goal status: Ongoing   PLAN:  PT FREQUENCY: every other week  PT DURATION: 6 weeks  PLANNED INTERVENTIONS: 97164- PT Re-evaluation, 97110-Therapeutic exercises, 97530- Therapeutic activity, [id number]- Neuromuscular re-education, 97535- Self Care, [id number]- Manual therapy, [id number]- Gait training, [phone]- Electrical stimulation (unattended), [id number]- Electrical stimulation (manual), 20560 (1-2 muscles), [id number] (3+ muscles)- Dry Needling, and Patient/Family education.  PLAN FOR NEXT SESSION: assess HEP response, extension-based stretching  Date of referral: 03/12/2024 Referring provider: Frann Mabel Mt, DO  Referring diagnosis? M54.16 (ICD-10-CM) - Lumbar radiculopathy  Treatment diagnosis? (if different than referring diagnosis)  Pain in right leg Other abnormalities of gait and mobility Muscle weakness (generalized)  What was this (referring dx) caused by? Arthritis  Nature of Condition: Initial Onset (within last 3 months)   Laterality: Rt  Current Functional Measure Score: Other ODI -  0/50  Objective measurements identify impairments when they are compared to normal values, the uninvolved extremity, and prior level of function.  [x]  Yes  []  No  Objective assessment of functional ability: No functional limitations   Briefly describe symptoms:  Pt presents to PT with reports of subacute onset of R LE N/T down lateral side to ankle. Denies bowel/bladder changes or saddle anesthesia. Lying on R side increases symptoms, otherwise she does not feel limited at all and has no LBP to  report. Wants to trial PT briefly but then possibly get referral to specialist to see what's going on.   How did symptoms start:  Insidious onset of N/T that has been progressively moving down to ankle  Average pain intensity:  Last 24 hours: 2/10  Past week: 2/10  How often does the pt experience symptoms? Frequently  How much have the symptoms interfered with usual daily activities? Not at all  How has condition changed since care began at this facility? NA - initial visit  In general, how is the patients overall health? Excellent   BACK PAIN (STarT Back Screening Tool) Has pain spread down the leg(s) at some time in the last 2 weeks? - yes Has there been pain in the shoulder or neck at some time in the last 2 weeks? No Has the pt only walked short distances because of back pain? No Has patient dressed more slowly because of back pain in the past 2 weeks? No Does patient think it's not safe for a person with this condition to be physically active? No Does patient have worrying thoughts a lot of the time? No Does patient feel back pain is terrible and will never get any better? No Has patient stopped enjoying things they usually enjoy? No   Alm JAYSON Kingdom, PT 06/06/2024, 7:59 AM  "

## 2024-07-18 ENCOUNTER — Ambulatory Visit: Payer: Medicare Other
